# Patient Record
Sex: Female | Born: 1975
Health system: Southern US, Community
[De-identification: ages and names within clinical notes are randomized; demographics above are authoritative.]

## PROBLEM LIST (undated history)

## (undated) DIAGNOSIS — Z1371 Encounter for nonprocreative screening for genetic disease carrier status: Secondary | ICD-10-CM

## (undated) DIAGNOSIS — C50919 Malignant neoplasm of unspecified site of unspecified female breast: Secondary | ICD-10-CM

## (undated) DIAGNOSIS — K929 Disease of digestive system, unspecified: Secondary | ICD-10-CM

## (undated) DIAGNOSIS — N879 Dysplasia of cervix uteri, unspecified: Secondary | ICD-10-CM

## (undated) DIAGNOSIS — K219 Gastro-esophageal reflux disease without esophagitis: Secondary | ICD-10-CM

## (undated) DIAGNOSIS — N83209 Unspecified ovarian cyst, unspecified side: Secondary | ICD-10-CM

## (undated) DIAGNOSIS — R002 Palpitations: Secondary | ICD-10-CM

## (undated) DIAGNOSIS — K589 Irritable bowel syndrome without diarrhea: Secondary | ICD-10-CM

## (undated) DIAGNOSIS — T7840XA Allergy, unspecified, initial encounter: Secondary | ICD-10-CM

## (undated) DIAGNOSIS — F419 Anxiety disorder, unspecified: Secondary | ICD-10-CM

## (undated) HISTORY — DX: Palpitations: R00.2

## (undated) HISTORY — DX: Allergy, unspecified, initial encounter: T78.40XA

## (undated) HISTORY — PX: MOUTH SURGERY: SHX715

## (undated) HISTORY — DX: Disease of digestive system, unspecified: K92.9

## (undated) HISTORY — PX: COLONOSCOPY: SHX174

## (undated) HISTORY — PX: CRYOTHERAPY: SHX1416

## (undated) HISTORY — DX: Encounter for nonprocreative screening for genetic disease carrier status: Z13.71

## (undated) HISTORY — DX: Anxiety disorder, unspecified: F41.9

## (undated) HISTORY — DX: Malignant neoplasm of unspecified site of unspecified female breast: C50.919

## (undated) HISTORY — PX: WISDOM TOOTH EXTRACTION: SHX21

## (undated) HISTORY — DX: Dysplasia of cervix uteri, unspecified: N87.9

## (undated) HISTORY — PX: TONSILLECTOMY: SUR1361

## (undated) HISTORY — DX: Unspecified ovarian cyst, unspecified side: N83.209

## (undated) HISTORY — PX: OTHER SURGICAL HISTORY: SHX169

---

## 2012-08-26 ENCOUNTER — Encounter: Payer: Self-pay | Admitting: Internal Medicine

## 2012-09-13 ENCOUNTER — Ambulatory Visit: Payer: Self-pay

## 2012-09-13 ENCOUNTER — Ambulatory Visit: Payer: Self-pay | Admitting: Internal Medicine

## 2012-09-30 ENCOUNTER — Encounter: Payer: Self-pay | Admitting: Internal Medicine

## 2012-10-01 ENCOUNTER — Other Ambulatory Visit (INDEPENDENT_AMBULATORY_CARE_PROVIDER_SITE_OTHER): Payer: PRIVATE HEALTH INSURANCE

## 2012-10-01 ENCOUNTER — Ambulatory Visit (INDEPENDENT_AMBULATORY_CARE_PROVIDER_SITE_OTHER): Payer: PRIVATE HEALTH INSURANCE | Admitting: Internal Medicine

## 2012-10-01 ENCOUNTER — Encounter: Payer: Self-pay | Admitting: Internal Medicine

## 2012-10-01 VITALS — BP 100/70 | HR 76 | Ht 61.5 in | Wt 125.4 lb

## 2012-10-01 DIAGNOSIS — R109 Unspecified abdominal pain: Secondary | ICD-10-CM

## 2012-10-01 DIAGNOSIS — F419 Anxiety disorder, unspecified: Secondary | ICD-10-CM | POA: Insufficient documentation

## 2012-10-01 DIAGNOSIS — R1030 Lower abdominal pain, unspecified: Secondary | ICD-10-CM

## 2012-10-01 DIAGNOSIS — K529 Noninfective gastroenteritis and colitis, unspecified: Secondary | ICD-10-CM

## 2012-10-01 DIAGNOSIS — R197 Diarrhea, unspecified: Secondary | ICD-10-CM

## 2012-10-01 LAB — CBC
Hemoglobin: 13.8 g/dL (ref 12.0–15.0)
MCHC: 34.1 g/dL (ref 30.0–36.0)
MCV: 91 fl (ref 78.0–100.0)
Platelets: 249 10*3/uL (ref 150.0–400.0)
RBC: 4.45 Mil/uL (ref 3.87–5.11)

## 2012-10-01 LAB — SEDIMENTATION RATE: Sed Rate: 7 mm/hr (ref 0–22)

## 2012-10-01 MED ORDER — ALIGN PO CAPS: 1.0000 | ORAL_CAPSULE | Freq: Every day | ORAL | Status: DC

## 2012-10-01 MED ORDER — LOPERAMIDE HCL 2 MG PO CAPS: 2.0000 mg | ORAL_CAPSULE | Freq: Four times a day (QID) | ORAL | Status: DC | PRN

## 2012-10-01 MED ORDER — HYOSCYAMINE SULFATE 0.125 MG SL SUBL: 0.1250 mg | SUBLINGUAL_TABLET | SUBLINGUAL | Status: DC | PRN

## 2012-10-01 MED ORDER — PEG-KCL-NACL-NASULF-NA ASC-C 100 G PO SOLR: 1.0000 | Freq: Once | ORAL | Status: DC

## 2012-10-01 NOTE — Patient Instructions (Addendum)
You have been scheduled for a colonoscopy with propofol. Please follow written instructions given to you at your visit today.  Please pick up your prep kit at the pharmacy within the next 1-3 days. If you use inhalers (even only as needed) or a CPAP machine, please bring them with you on the day of your procedure.   We have sent the following medications to your pharmacy for you to pick up at your convenience: Loperamide, take every morning and as needed after a loose stool.  Levsin, take as directed, moviprep, you were given instructions today at your office visit.  Your physician has requested that you go to the basement for the following lab work before leaving today: CBC, TSH, ESR

## 2012-10-01 NOTE — Progress Notes (Addendum)
Patient ID: Allison Mcclure, female   DOB: December 24, 1975, 37 y.o.   MRN: [id number]  SUBJECTIVE: HPI Allison Mcclure is a 37 yo female with PMH of anxiety and ovarian cysts who is seen for evaluation of lower abd cramping pain and diarrhea.  She reports that she has a long-standing history of diarrhea, dating back to her teenage years. Her diarrhea is always triggered by eating and she describes this as "getting sick". She has not been able to find a trigger food, and states she keep the same foods on two consecutive days, and then have diarrhea one of the 2 days. She does report she has diarrhea on most days as many as 5/7. Occasionally she does have formed stool. The diarrhea is often urgent and associated with lower, cramping. The lower abdominal cramping usually abates with bowel movement. She notes that stress worsens her symptoms, but is not the sole predictor. She didn't trial of a probiotic as well as Dexilant without any benefit. She did notice some heartburn after coming off Dexilant but this abated. She reports rare nausea but no vomiting. No heartburn. No dysphagia or odynophagia. No fevers or chills. Over last several weeks she has had more right-sided, lower abdominal pain. This also usually abates with bowel movement. She was seen in emergency department near [facility] in late November for this right lower quadrant pain, was evaluated by CT scan and pelvic ultrasound. She reports is revealed several small ovarian cysts, which her GYN doctor later did not feel to be pathologic. She denies rashes, mouth ulcers, joint pains. No eye complaints.  She did see Allison Mcclure about 18 months ago and reportedly had a negative celiac panel. She's never had colonoscopy.  Review of Systems  As per history of present illness, otherwise negative   Past Medical History  Diagnosis Date  . Anxiety   . Cervical dysplasia   . Gastrointestinal disorder   . Ovarian cyst     Current Outpatient Prescriptions    Medication Sig Dispense Refill  . ALPRAZolam (XANAX) 0.5 MG tablet Take 1 mg by mouth 2 (two) times daily as needed.      . etonogestrel-ethinyl estradiol (NUVARING) 0.12-0.015 MG/24HR vaginal ring Place 1 each vaginally every 28 (twenty-eight) days. Insert vaginally and leave in place for 3 consecutive weeks, then remove for 1 week.      . loratadine (ALAVERT) 10 MG tablet Take 10 mg by mouth daily as needed.      . bifidobacterium infantis (ALIGN) capsule Take 1 capsule by mouth daily.  14 capsule  0  . hyoscyamine (LEVSIN SL) 0.125 MG SL tablet Place 1 tablet (0.125 mg total) under the tongue every 4 (four) hours as needed for cramping.  30 tablet  0  . loperamide (IMODIUM) 2 MG capsule Take 1 capsule (2 mg total) by mouth 4 (four) times daily as needed for diarrhea or loose stools.  30 capsule  0  . peg 3350 powder (MOVIPREP) 100 G SOLR Take 1 kit (100 g total) by mouth once.  1 kit  0    No Known Allergies  Family History  Problem Relation Age of Onset  . Hyperlipidemia Father   . Hypertension Mother   . Prostate cancer Father   . Colon cancer Neg Hx   . Hyperlipidemia Mother   . Hypertension Father     History  Substance Use Topics  . Smoking status: Never Smoker   . Smokeless tobacco: Never Used  . Alcohol Use: Yes  Comment: 0-1 drinks daily    OBJECTIVE: BP 100/70  Pulse 76  Ht 5' 1.5" (1.562 m)  Wt 125 lb 6.4 oz (56.881 kg)  BMI 23.31 kg/m2  LMP 09/17/2012 Constitutional: Well-developed and well-nourished. No distress. HEENT: Normocephalic and atraumatic. Oropharynx is clear and moist. No oropharyngeal exudate. Conjunctivae are normal. No scleral icterus. Neck: Neck supple. Trachea midline. Cardiovascular: Normal rate, regular rhythm and intact distal pulses. No M/R/G Pulmonary/chest: Effort normal and breath sounds normal. No wheezing, rales or rhonchi. Abdominal: Soft, mild lower tenderness without rebound or guarding, nondistended. Bowel sounds active  throughout. There are no masses palpable. No hepatosplenomegaly. Extremities: no clubbing, cyanosis, or edema Lymphadenopathy: No cervical adenopathy noted. Neurological: Alert and oriented to person place and time. Skin: Skin is warm and dry. No rashes noted. Psychiatric: Normal mood and affect. Behavior is normal.  Labs  Celiac panel dated 02/28/2011 - negative  ASSESSMENT AND PLAN:  37 yo female with PMH of anxiety and ovarian cysts who is seen for evaluation of lower abd cramping pain and diarrhea  1.  Chronic diarrhea with fecal urgency and lower abd pain -- the patient's symptoms are most consistent with irritable bowel syndrome with diarrhea predominance. We discussed the workup and I would like to order a TSH today, along with a CBC and ESR. Also have recommended colonoscopy for direct visualization and to rule out an inflammatory condition such as IBD or microscopic colitis. If this is negative then we can try symptomatic management. I have recommended loperamide 2 mg daily, she can take additional doses after each loose stool. Also will give her a trial of align one capsule daily. She's also provided Levsin to be used as needed and as directed for abdominal pain and spasm. If this is indeed IBS with diarrhea predominance and she has not responded these initial medications, I will consider a trial of Lotronex.   Addendum Records received from [facility] CT scan abdomen and pelvis with contrast performed 08/17/2012 -- conclusion: No acute intra-abdominal or pelvic pathology. Transabdominal and transvaginal pelvic grayscale ultrasound supplemented with Doppler color flow imaging -- conclusion: Essentially negative study

## 2012-10-10 ENCOUNTER — Encounter: Payer: Self-pay | Admitting: Internal Medicine

## 2012-10-10 ENCOUNTER — Ambulatory Visit (AMBULATORY_SURGERY_CENTER): Payer: PRIVATE HEALTH INSURANCE | Admitting: Internal Medicine

## 2012-10-10 VITALS — BP 107/66 | HR 75 | Temp 98.4°F | Resp 45 | Ht 61.0 in | Wt 125.0 lb

## 2012-10-10 DIAGNOSIS — R197 Diarrhea, unspecified: Secondary | ICD-10-CM

## 2012-10-10 DIAGNOSIS — K529 Noninfective gastroenteritis and colitis, unspecified: Secondary | ICD-10-CM

## 2012-10-10 DIAGNOSIS — D126 Benign neoplasm of colon, unspecified: Secondary | ICD-10-CM

## 2012-10-10 DIAGNOSIS — R109 Unspecified abdominal pain: Secondary | ICD-10-CM

## 2012-10-10 MED ORDER — SODIUM CHLORIDE 0.9 % IV SOLN: 500.0000 mL | INTRAVENOUS | Status: DC

## 2012-10-10 NOTE — Op Note (Signed)
[facility] [address] [place], [address]   COLONOSCOPY PROCEDURE REPORT  PATIENT: Allison, Mcclure  MR#: [id number] BIRTHDATE: 05/28/1976 , 36  yrs. old GENDER: Female ENDOSCOPIST: Beverley Fiedler, MD PROCEDURE DATE:  10/10/2012 PROCEDURE:   Colonoscopy with biopsy ASA CLASS:   Class I INDICATIONS:Chronic diarrhea. MEDICATIONS: MAC sedation, administered by CRNA and propofol (Diprivan) 200mg  IV  DESCRIPTION OF PROCEDURE:   After the risks benefits and alternatives of the procedure were thoroughly explained, informed consent was obtained.  A digital rectal exam revealed no rectal mass.   The LB [id number]  endoscope was introduced through the anus and advanced to the cecum, which was identified by both the appendix and ileocecal valve. No adverse events experienced. The quality of the prep was excellent, using MoviPrep  The instrument was then slowly withdrawn as the colon was fully examined.    COLON FINDINGS: A normal appearing cecum, ileocecal valve, and appendiceal orifice were identified.  The ascending, hepatic flexure, transverse, splenic flexure, descending, sigmoid colon and rectum appeared unremarkable.  No polyps or cancers were seen. Multiple random biopsies of the area were performed.  Retroflexed views revealed no abnormalities. The time to cecum=2 minutes 35 seconds.  Withdrawal time=8 minutes 02 seconds.  The scope was withdrawn and the procedure completed.  COMPLICATIONS: There were no complications.  ENDOSCOPIC IMPRESSION: Normal colon; multiple random biopsies of the area were performed  RECOMMENDATIONS: 1.  Await pathology results 2.  Continue Align one capsule daily.  Can use loperamide (Imodium) 2 mg as needed for loose stools/diarrhea (up to 8 tablets daily). 3.  Please contact the office if your symptoms remain troublesome as there are additional medications that will likely provide benefit.   eSigned:  Beverley Fiedler,  MD 10/10/2012 2:45 PM   cc: The Patient and Juluis Rainier, MD

## 2012-10-10 NOTE — Progress Notes (Signed)
Called to room to assist during endoscopic procedure.  Patient ID and intended procedure confirmed with present staff. Received instructions for my participation in the procedure from the performing physician.  

## 2012-10-10 NOTE — Progress Notes (Signed)
Lidocaine-40mg IV prior to Propofol InductionPropofol given over incremental dosages 

## 2012-10-10 NOTE — Patient Instructions (Addendum)

## 2012-10-10 NOTE — Progress Notes (Signed)
Patient did not experience any of the following events: a burn prior to discharge; a fall within the facility; wrong site/side/patient/procedure/implant event; or a hospital transfer or hospital admission upon discharge from the facility. (G8907) Patient did not have preoperative order for IV antibiotic SSI prophylaxis. (G8918)  

## 2012-10-11 ENCOUNTER — Telehealth: Payer: Self-pay | Admitting: *Deleted

## 2012-10-11 NOTE — Telephone Encounter (Signed)
Left message on number given in admitting.ewm 

## 2012-10-16 ENCOUNTER — Encounter: Payer: Self-pay | Admitting: Internal Medicine

## 2012-11-28 ENCOUNTER — Telehealth: Payer: Self-pay | Admitting: Internal Medicine

## 2012-11-28 NOTE — Telephone Encounter (Signed)
L/M for patient to call me to discuss her procedure bill.

## 2013-04-23 ENCOUNTER — Encounter: Payer: Self-pay | Admitting: Internal Medicine

## 2013-04-23 ENCOUNTER — Ambulatory Visit (INDEPENDENT_AMBULATORY_CARE_PROVIDER_SITE_OTHER): Payer: BC Managed Care – PPO | Admitting: Internal Medicine

## 2013-04-23 VITALS — BP 124/62 | HR 99 | Ht 61.0 in | Wt 121.0 lb

## 2013-04-23 DIAGNOSIS — K589 Irritable bowel syndrome without diarrhea: Secondary | ICD-10-CM | POA: Insufficient documentation

## 2013-04-23 DIAGNOSIS — R197 Diarrhea, unspecified: Secondary | ICD-10-CM

## 2013-04-23 MED ORDER — ALOSETRON HCL 0.5 MG PO TABS: 0.5000 mg | ORAL_TABLET | Freq: Two times a day (BID) | ORAL | Status: DC

## 2013-04-23 NOTE — Patient Instructions (Addendum)
We have sent the following medications to your pharmacy for you to pick up at your convenience: Lotronex  Follow up with Dr. Rhea Belton in office in 1 month                                               We are excited to introduce MyChart, a new best-in-class service that provides you online access to important information in your electronic medical record. We want to make it easier for you to view your health information - all in one secure location - when and where you need it. We expect MyChart will enhance the quality of care and service we provide.  When you register for MyChart, you can:    View your test results.    Request appointments and receive appointment reminders via email.    Request medication renewals.    View your medical history, allergies, medications and immunizations.    Communicate with your physician's office through a password-protected site.    Conveniently print information such as your medication lists.  To find out if MyChart is right for you, please talk to a member of our clinical staff today. We will gladly answer your questions about this free health and wellness tool.  If you are age 43 or older and want a member of your family to have access to your record, you must provide written consent by completing a proxy form available at our office. Please speak to our clinical staff about guidelines regarding accounts for patients younger than age 35.  As you activate your MyChart account and need any technical assistance, please call the MyChart technical support line at [phone]-[phone]) or email your question to [email or web address]. If you email your question(s), please include your name, a return phone number and the best time to reach you.  If you have non-urgent health-related questions, you can send a message to our office through MyChart at [email or web address]. If you have a medical emergency, call 911.  Thank you for using MyChart as  your new health and wellness resource!   MyChart licensed from [employer],  4540-9811. Patents Pending.

## 2013-04-23 NOTE — Progress Notes (Signed)
Subjective:    Patient ID: Allison Mcclure, female    DOB: Jul 31, 1976, 37 y.o.   MRN: [id number]  HPI [name] is a 37 yo female with PMH of IBS, anxiety and ovarian cysts who is seen in followup. She was initially seen in January 2014 and came for colonoscopy on 10/10/2012 to evaluate chronic diarrhea and lower abdominal pain. Her colonoscopy was normal and random biopsies were also benign without evidence of microscopic colitis or other inflammation. She did try probiotic, initially feeling like it helped, but it seemed to lose its efficacy and in fact later she felt more bloated. She has continued to have issues with loose stools and occasional lower abdominal cramping. Her loose stools are worse in the morning and during periods of stress. She is certain it is anxiety and stress related, though she can still have loose stools on days when she is not particularly stressed. She very rarely has a formed stool. No blood in her stool or melena. She reports on those mornings she wakes up feeling "sick" and will have 3-4 loose stools, and then occasionally will have days where she has postprandial loose stools later in the day. She has tried Imodium but finds this to be difficult to take due to an overall full feeling and giving her worse abdominal bloating.  She also tried Pepto-Bismol which helped a little bit but still leads to a full or bloated feeling.  Of note she did have an episode of substernal chest pain radiating to her back that led to ER visit which she later felt was possibly upper GI gas. She has not had this on recurring basis. She denies heartburn.  Review of Systems As per history of present illness, otherwise negative  Current Medications, Allergies, Past Medical History, Past Surgical History, Family History and Social History were reviewed in [employer] record.     Objective:   Physical Exam BP 124/62  Pulse 99  Ht 5\' 1"  (1.549 m)  Wt 121 lb  (54.885 kg)  BMI 22.87 kg/m2 Constitutional: Well-developed and well-nourished. No distress. HEENT: Normocephalic and atraumatic.  No scleral icterus. Abdominal: Soft, nontender, nondistended. Bowel sounds active throughout. There are no masses palpable. No hepatosplenomegaly. Extremities: no clubbing, cyanosis, or edema Neurological: Alert and oriented to person place and time. Skin: Skin is warm and dry. No rashes noted. Psychiatric: Normal mood and affect. Behavior is normal.  Erythrocyte Sedimentation Rate     Component Value Date/Time   ESRSEDRATE 7 10/01/2012 1012   Celiac panel -neg TSH normal  CBC    Component Value Date/Time   WBC 6.3 10/01/2012 1012   RBC 4.45 10/01/2012 1012   HGB 13.8 10/01/2012 1012   HCT 40.5 10/01/2012 1012   PLT 249.0 10/01/2012 1012   MCV 91.0 10/01/2012 1012   MCHC 34.1 10/01/2012 1012   RDW 12.6 10/01/2012 1012       Assessment & Plan:  37 yo female with PMH of IBS, anxiety and ovarian cysts who is seen in followup.  1.  IBS-D -- her symptoms are very consistent with irritable bowel syndrome with diarrhea predominance. This is a long-standing issue and she has had an unremarkable workup to this point including negative laboratory evaluation and normal colonoscopy. We have discussed IBS at length today as well as options for treatment. She did not respond well to antidiarrheals such as loperamide and bismuth. Probiotic did not seem to help her.  I do think she would be an excellent candidate  for Lotronex therapy. She has read through the medication information packet, read through and signed the patient acknowledgement form.  We discussed at length the risks, though rare, of severe constipation and even ischemic colitis. I have instructed and she has agreed to notify me immediately should she develop constipation, change in abdominal pain, worsening abdominal pain, rectal bleeding. She voiced understanding.  I will start her on Lotronex 0.5 mg twice daily.  I would like to see her back in 4-6 weeks to determine how she is responding to this new medication. She will discontinue all other antidiarrheals at this time.

## 2013-04-24 ENCOUNTER — Telehealth: Payer: Self-pay | Admitting: *Deleted

## 2013-04-24 NOTE — Telephone Encounter (Signed)
Pt wanted me to inform Dr Rhea Belton her Allison Mcclure had Colon Cancer and her mom has had 8 polyps removed. Informed pt I will forward the info to Dr Rhea Belton.

## 2013-06-09 ENCOUNTER — Telehealth: Payer: Self-pay | Admitting: *Deleted

## 2013-06-09 NOTE — Telephone Encounter (Signed)
Pt called to ask if Dr Rhea Belton found out anything about her bills. Explained to pt Dr Rhea Belton will be back tomorrow, but I will make a call and let her know; pt stated understanding.

## 2013-06-11 NOTE — Telephone Encounter (Signed)
Spoke with Clarnce Flock who received the info on pt; she is looking into the matter. lmom for pt to call back.

## 2013-06-13 ENCOUNTER — Other Ambulatory Visit: Payer: Self-pay | Admitting: Dermatology

## 2013-06-16 ENCOUNTER — Other Ambulatory Visit: Payer: Self-pay | Admitting: Dermatology

## 2013-06-16 DIAGNOSIS — D485 Neoplasm of uncertain behavior of skin: Secondary | ICD-10-CM

## 2013-06-16 NOTE — Telephone Encounter (Signed)
Pt called again today and I explained I will ask my boss about the matter; pt stated understanding.

## 2013-06-19 ENCOUNTER — Ambulatory Visit
Admission: RE | Admit: 2013-06-19 | Discharge: 2013-06-19 | Disposition: A | Discharge status: Home / Self Care | Payer: BC Managed Care – PPO | Source: Ambulatory Visit | Attending: Dermatology | Admitting: Dermatology

## 2013-06-19 DIAGNOSIS — D485 Neoplasm of uncertain behavior of skin: Secondary | ICD-10-CM

## 2013-06-26 ENCOUNTER — Telehealth: Payer: Self-pay | Admitting: *Deleted

## 2013-06-26 NOTE — Telephone Encounter (Signed)
Pt called again about her bill; will refer to Clarnce Flock.

## 2013-07-09 NOTE — Telephone Encounter (Signed)
Informed pt that our billing dept is working on an appeal to her insurance company to reconsider her claim; this process may take up to 30 days. Her account is on hold requiring no collections and no statements will be mailed. Pt was questioning the Anesthesia bill, but she will call to check on this.

## 2013-09-22 ENCOUNTER — Telehealth: Payer: Self-pay | Admitting: *Deleted

## 2013-09-22 NOTE — Telephone Encounter (Signed)
Pt was calling about her bill again. She called billing and her account is on hold. Will check with Beola Cord and call her back.

## 2013-09-22 NOTE — Telephone Encounter (Signed)
Allison Mcclure will check with Billing again; they were resending the claim.

## 2013-10-15 NOTE — Telephone Encounter (Signed)
Pt called again today. Promised her I will call her in the am. She wanted Dr Hilarie Fredrickson to know she just found out her mom had polyps removed last year. She states she has paid for anesthesia. She knows her account is on hold, but she doesn't want a surprise bill with a large balance. Informed pt I will call her tomorrow and she stated understanding.

## 2013-10-16 NOTE — Telephone Encounter (Signed)
lmom for pt that I am working on her situation.

## 2013-10-28 NOTE — Telephone Encounter (Signed)
Okay  Also  I started her on alosetron (Lotronex). Did it help?     This is an old note , but I called the pt and she never started the Lotronex; states she's doing good for the 1st time in a long time ; no diarrhea.Marland Kitchen  Spoke with her again about her bill. I will call her Friday with a point of contact for her.

## 2013-10-30 NOTE — Telephone Encounter (Signed)
lmom for pt to call back

## 2013-10-31 NOTE — Telephone Encounter (Signed)
Sheri, I have referred this pt to Luciano Cutter in billing. Pt knows that she is to contact you if she has a question. Remo Lipps is aware of this pt and the billing question as well as Dr Hilarie Fredrickson. Thanks.

## 2015-09-19 DIAGNOSIS — Z1371 Encounter for nonprocreative screening for genetic disease carrier status: Secondary | ICD-10-CM

## 2015-09-19 HISTORY — DX: Encounter for nonprocreative screening for genetic disease carrier status: Z13.71

## 2015-10-07 ENCOUNTER — Telehealth: Payer: Self-pay | Admitting: Internal Medicine

## 2015-10-07 NOTE — Telephone Encounter (Signed)
Pt states she is having abdominal pain and cramping. States that every time she eats she has diarrhea. Pt requests to be seen. Pt scheduled to see Amy Esterwood PA 10/11/15@10  am. Pt aware of appt.

## 2015-10-11 ENCOUNTER — Encounter: Payer: Self-pay | Admitting: Physician Assistant

## 2015-10-11 ENCOUNTER — Ambulatory Visit (INDEPENDENT_AMBULATORY_CARE_PROVIDER_SITE_OTHER): Payer: Managed Care, Other (non HMO) | Admitting: Physician Assistant

## 2015-10-11 ENCOUNTER — Other Ambulatory Visit (INDEPENDENT_AMBULATORY_CARE_PROVIDER_SITE_OTHER): Payer: Managed Care, Other (non HMO)

## 2015-10-11 VITALS — BP 118/72 | HR 72 | Ht 62.0 in | Wt 120.4 lb

## 2015-10-11 DIAGNOSIS — R197 Diarrhea, unspecified: Secondary | ICD-10-CM | POA: Diagnosis not present

## 2015-10-11 DIAGNOSIS — K589 Irritable bowel syndrome without diarrhea: Secondary | ICD-10-CM

## 2015-10-11 LAB — IGA: IgA: 167 mg/dL (ref 68–378)

## 2015-10-11 LAB — HIGH SENSITIVITY CRP: CRP, High Sensitivity: 4.17 mg/L (ref 0.000–5.000)

## 2015-10-11 LAB — CBC WITH DIFFERENTIAL/PLATELET
BASOS ABS: 0 10*3/uL (ref 0.0–0.1)
Basophils Relative: 0.4 % (ref 0.0–3.0)
EOS ABS: 0.1 10*3/uL (ref 0.0–0.7)
Eosinophils Relative: 1.1 % (ref 0.0–5.0)
HCT: 45.7 % (ref 36.0–46.0)
Hemoglobin: 15.3 g/dL — ABNORMAL HIGH (ref 12.0–15.0)
LYMPHS ABS: 1.7 10*3/uL (ref 0.7–4.0)
Lymphocytes Relative: 23.6 % (ref 12.0–46.0)
MCHC: 33.5 g/dL (ref 30.0–36.0)
MCV: 93.4 fl (ref 78.0–100.0)
MONO ABS: 0.5 10*3/uL (ref 0.1–1.0)
MONOS PCT: 6.7 % (ref 3.0–12.0)
NEUTROS ABS: 4.8 10*3/uL (ref 1.4–7.7)
NEUTROS PCT: 68.2 % (ref 43.0–77.0)
PLATELETS: 290 10*3/uL (ref 150.0–400.0)
RBC: 4.89 Mil/uL (ref 3.87–5.11)
RDW: 13.1 % (ref 11.5–15.5)
WBC: 7.1 10*3/uL (ref 4.0–10.5)

## 2015-10-11 LAB — TSH: TSH: 0.6 u[IU]/mL (ref 0.35–4.50)

## 2015-10-11 LAB — SEDIMENTATION RATE: SED RATE: 7 mm/h (ref 0–22)

## 2015-10-11 MED ORDER — GLYCOPYRROLATE 2 MG PO TABS: 2.0000 mg | ORAL_TABLET | Freq: Three times a day (TID) | ORAL | Status: DC

## 2015-10-11 NOTE — Patient Instructions (Addendum)
Please go to the basement level to have your labs drawn.  Start daily a probiotic, Align or Culturelle.  We sent a prescription for the Robinul Forte to [facility] in [employer], [address]   Call us in 3-4 weeks if not feeling better.

## 2015-10-11 NOTE — Progress Notes (Addendum)
Patient ID: Allison Mcclure, female   DOB: Jul 24, 1976, 40 y.o.   MRN: [id number]   Subjective:    Patient ID: Allison Mcclure, female    DOB: 03-05-1976, 40 y.o.   MRN: [id number]  HPI  Allison Mcclure  Is a pleasant 40 year old white female known to Dr.Pyrtle. She had undergone colonoscopy in January 2014 4 complaints of chronic diarrhea and abdominal discomfort. Random biopsies were done and were negative for microscopic colitis. She is felt to have diarrhea predominant IBS. She had been prescribed a trial of Lotronex in 2014 but says she never took this. She was seen by her PCP in 2015 with complaints of chest pain and indigestion was started on Prilosec daily and says this did help the indigestion and also seem to help her other "IBS" symptoms she hadn't had any problems with diarrhea for about a year and a half and then over the past 2 months started having diarrhea after eating again wishes is a little bit different than previous. She usually has 2-3 loose bowel movements per day and notices that certain food and drinks such as wine seem to aggravate the symptoms. She's not consuming any regular carbonate's or artificial sweeteners. She has had some mid abdominal discomfort as well. No recent antibiotics or changes in meds.  Review of Systems Pertinent positive and negative review of systems were noted in the above HPI section.  All other review of systems was otherwise negative.  Outpatient Encounter Prescriptions as of 10/11/2015  Medication Sig  . chlordiazePOXIDE (LIBRIUM) 10 MG capsule Take 10 mg by mouth 3 (three) times daily as needed for anxiety.  Marland Kitchen etonogestrel-ethinyl estradiol (NUVARING) 0.12-0.015 MG/24HR vaginal ring Place 1 each vaginally every 28 (twenty-eight) days. Insert vaginally and leave in place for 3 consecutive weeks, then remove for 1 week.  . loratadine (ALAVERT) 10 MG tablet Take 10 mg by mouth daily as needed.  Marland Kitchen omeprazole (PRILOSEC) 20 MG capsule TAKE 1 CAPUSLE BY  MOUTH DAILY 30 MINS PRIOR TO FIRST MEAL AS NEEDED  . [DISCONTINUED] alosetron (LOTRONEX) 0.5 MG tablet Take 1 tablet (0.5 mg total) by mouth 2 (two) times daily.  . [DISCONTINUED] ALPRAZolam (XANAX) 0.5 MG tablet Take 1 mg by mouth 2 (two) times daily as needed.  Marland Kitchen glycopyrrolate (ROBINUL-FORTE) 2 MG tablet Take 1 tablet (2 mg total) by mouth 3 (three) times daily.   No facility-administered encounter medications on file as of 10/11/2015.   No Known Allergies Patient Active Problem List   Diagnosis Date Noted  . IBS (irritable bowel syndrome) 04/23/2013  . Chronic diarrhea 10/01/2012  . Anxiety 10/01/2012   Social History   Social History  . Marital Status: Unknown    Spouse Name: N/A  . Number of Children: N/A  . Years of Education: N/A   Occupational History  . Not on file.   Social History Main Topics  . Smoking status: Never Smoker   . Smokeless tobacco: Never Used  . Alcohol Use: Yes     Comment: 0-1 drinks daily  . Drug Use: No  . Sexual Activity: Not on file   Other Topics Concern  . Not on file   Social History Narrative    [name] family history includes Hyperlipidemia in her father and mother; Hypertension in her father and mother; Prostate cancer in her father. There is no history of Colon cancer.      Objective:    Filed Vitals:   10/11/15 1017  BP: 118/72  Pulse: 72  Physical Exam   Well-developed white female in no acute distress , pleasant blood pressure 118/72 pulse 72 height 5 foot 2 weight 120. HEENT; nontraumatic normocephalic EOMI PERRLA sclera anicteric, Cardiovascular; regular rate and rhythm with S1-S2 no murmur rub or gallop, Pulmonary; clear bilaterally, Abdomen; soft no focal tenderness no guarding or rebound no palpable mass or hepatosplenomegaly bowel sounds are present, Rectal; exam not done, Ext; no clubbing cyanosis or edema skin warm and dry, Neuropsych ;mood and affect appropriate     Assessment & Plan:   #1 40 yo  female with hx IBS-D with exacerbation of symptoms x 2 months with loose stools and intermittent abdominal pain #2 GERD   Plan; Pt prefers not to take a daily prescription med if possible Will try adding daily probiotic -Align or Culturelle Add Robinul forte 2 mg po qam- BID as needed She will try this for a month and is  No improvement will call- we discussed a trial of Viberzi 100 mg po bid if above ineffective Continue omeprazole 20 mg po daily CBC,CRP,TSH, TTG Allison Mcclure    Allison Mcclure S Allison Summons PA-C 10/11/2015   Cc: Allison Ruff, MD  Addendum: Reviewed and agree with management. Allison Bears, MD

## 2015-10-12 LAB — TISSUE TRANSGLUTAMINASE, IGG: Tissue Transglut Ab: 1 U/mL (ref <6)

## 2015-12-08 DIAGNOSIS — J3089 Other allergic rhinitis: Secondary | ICD-10-CM | POA: Insufficient documentation

## 2015-12-18 DIAGNOSIS — C50919 Malignant neoplasm of unspecified site of unspecified female breast: Secondary | ICD-10-CM

## 2015-12-18 HISTORY — DX: Malignant neoplasm of unspecified site of unspecified female breast: C50.919

## 2016-01-03 ENCOUNTER — Other Ambulatory Visit: Payer: Self-pay | Admitting: Radiology

## 2016-01-06 ENCOUNTER — Telehealth: Payer: Self-pay | Admitting: *Deleted

## 2016-01-06 DIAGNOSIS — C50411 Malignant neoplasm of upper-outer quadrant of right female breast: Secondary | ICD-10-CM

## 2016-01-06 NOTE — Telephone Encounter (Signed)
Confirmed [facility] for 01/12/16 at 1215 .  Instructions and contact information given. 

## 2016-01-06 NOTE — Telephone Encounter (Signed)
Left vm for pt to return call regarding appt for [facility] on 01/12/16.

## 2016-01-10 ENCOUNTER — Telehealth: Payer: Self-pay | Admitting: *Deleted

## 2016-01-11 ENCOUNTER — Telehealth: Payer: Self-pay | Admitting: *Deleted

## 2016-01-11 NOTE — Telephone Encounter (Signed)
Pt decided to attend [facility] on 01/19/16. Instructions given.

## 2016-01-11 NOTE — Telephone Encounter (Signed)
Pt called and request her surgeon of either Dr. Donne Hazel or Dr. Barry Dienes. Informed pt that Dr. Barry Dienes will be in clinic next week on 5/3 if she would like to wait for that clinic, or she could be seen by either of them without coming to clinic. Pt relate she will discuss her parents to see which appt she would like. Pt will call with final decision.

## 2016-01-12 ENCOUNTER — Ambulatory Visit: Payer: Managed Care, Other (non HMO) | Admitting: Oncology

## 2016-01-12 ENCOUNTER — Other Ambulatory Visit: Payer: Managed Care, Other (non HMO)

## 2016-01-12 ENCOUNTER — Encounter: Payer: Self-pay | Admitting: *Deleted

## 2016-01-12 ENCOUNTER — Ambulatory Visit: Payer: Managed Care, Other (non HMO) | Admitting: Physical Therapy

## 2016-01-12 DIAGNOSIS — C50411 Malignant neoplasm of upper-outer quadrant of right female breast: Secondary | ICD-10-CM

## 2016-01-18 ENCOUNTER — Other Ambulatory Visit: Payer: Self-pay | Admitting: General Surgery

## 2016-01-18 ENCOUNTER — Telehealth: Payer: Self-pay | Admitting: *Deleted

## 2016-01-18 NOTE — Telephone Encounter (Signed)
  Oncology Nurse Navigator Documentation    Navigator Encounter Type: Telephone (01/18/16 1500) Telephone: Lahoma Crocker Call;Appt Confirmation/Clarification (01/18/16 1500)                                        Time Spent with Patient: 30 (01/18/16 1500)

## 2016-01-19 ENCOUNTER — Other Ambulatory Visit: Payer: Self-pay | Admitting: General Surgery

## 2016-01-19 ENCOUNTER — Encounter: Payer: Self-pay | Admitting: Hematology and Oncology

## 2016-01-19 ENCOUNTER — Ambulatory Visit (HOSPITAL_BASED_OUTPATIENT_CLINIC_OR_DEPARTMENT_OTHER): Payer: Managed Care, Other (non HMO) | Admitting: Hematology and Oncology

## 2016-01-19 ENCOUNTER — Ambulatory Visit
Admission: RE | Admit: 2016-01-19 | Discharge: 2016-01-19 | Disposition: A | Discharge status: Home / Self Care | Payer: Managed Care, Other (non HMO) | Source: Ambulatory Visit | Attending: Radiation Oncology | Admitting: Radiation Oncology

## 2016-01-19 ENCOUNTER — Ambulatory Visit: Payer: Managed Care, Other (non HMO) | Attending: General Surgery | Admitting: Physical Therapy

## 2016-01-19 ENCOUNTER — Other Ambulatory Visit (HOSPITAL_BASED_OUTPATIENT_CLINIC_OR_DEPARTMENT_OTHER): Payer: Managed Care, Other (non HMO)

## 2016-01-19 ENCOUNTER — Encounter: Payer: Self-pay | Admitting: Physical Therapy

## 2016-01-19 VITALS — BP 135/80 | HR 86 | Temp 98.1°F | Resp 20 | Ht 62.0 in | Wt 117.6 lb

## 2016-01-19 DIAGNOSIS — C50411 Malignant neoplasm of upper-outer quadrant of right female breast: Secondary | ICD-10-CM

## 2016-01-19 DIAGNOSIS — M545 Low back pain, unspecified: Secondary | ICD-10-CM

## 2016-01-19 LAB — CBC WITH DIFFERENTIAL/PLATELET
BASO%: 0.6 % (ref 0.0–2.0)
BASOS ABS: 0.1 10*3/uL (ref 0.0–0.1)
EOS%: 1.2 % (ref 0.0–7.0)
Eosinophils Absolute: 0.1 10*3/uL (ref 0.0–0.5)
HEMATOCRIT: 44.9 % (ref 34.8–46.6)
HGB: 14.7 g/dL (ref 11.6–15.9)
LYMPH#: 1.5 10*3/uL (ref 0.9–3.3)
LYMPH%: 17.8 % (ref 14.0–49.7)
MCH: 31 pg (ref 25.1–34.0)
MCHC: 32.8 g/dL (ref 31.5–36.0)
MCV: 94.5 fL (ref 79.5–101.0)
MONO#: 0.5 10*3/uL (ref 0.1–0.9)
MONO%: 5.9 % (ref 0.0–14.0)
NEUT#: 6.2 10*3/uL (ref 1.5–6.5)
NEUT%: 74.5 % (ref 38.4–76.8)
PLATELETS: 271 10*3/uL (ref 145–400)
RBC: 4.75 10*6/uL (ref 3.70–5.45)
RDW: 12.7 % (ref 11.2–14.5)
WBC: 8.4 10*3/uL (ref 3.9–10.3)

## 2016-01-19 LAB — COMPREHENSIVE METABOLIC PANEL
ALT: 12 U/L (ref 0–55)
ANION GAP: 8 meq/L (ref 3–11)
AST: 13 U/L (ref 5–34)
Albumin: 3.7 g/dL (ref 3.5–5.0)
Alkaline Phosphatase: 55 U/L (ref 40–150)
BUN: 15.1 mg/dL (ref 7.0–26.0)
CALCIUM: 9.2 mg/dL (ref 8.4–10.4)
CHLORIDE: 109 meq/L (ref 98–109)
CO2: 23 meq/L (ref 22–29)
CREATININE: 0.9 mg/dL (ref 0.6–1.1)
EGFR: 85 mL/min/{1.73_m2} — AB (ref >90)
Glucose: 99 mg/dl (ref 70–140)
POTASSIUM: 3.7 meq/L (ref 3.5–5.1)
Sodium: 140 mEq/L (ref 136–145)
Total Bilirubin: 0.36 mg/dL (ref 0.20–1.20)
Total Protein: 6.6 g/dL (ref 6.4–8.3)

## 2016-01-19 NOTE — Patient Instructions (Signed)

## 2016-01-19 NOTE — Progress Notes (Signed)
Note created during office visit by Dr. Gudena, copy to patient, original to scan. 

## 2016-01-19 NOTE — Assessment & Plan Note (Signed)
Screening detected left breast asymmetry posteriorly at 11:00 position 9 x 8 x 5 mm by ultrasound, axilla negative Right breast ultrasound measured 8 mm at 10:00 position axillary normal, T1b N0 stage IA  Pathology and radiology counseling:Discussed with the patient, the details of pathology including the type of breast cancer,the clinical staging, the significance of ER, PR and HER-2/neu receptors and the implications for treatment. After reviewing the pathology in detail, we proceeded to discuss the different treatment options between surgery, radiation, chemotherapy, antiestrogen therapies.  Recommendations:genetic testing has been performed by her PCP. 1. Breast conserving surgery followed by 2. Oncotype DX testing to determine if chemotherapy would be of any benefit followed by 3. Adjuvant radiation therapy followed by 4. Adjuvant antiestrogen therapy  Oncotype counseling: I discussed Oncotype DX test. I explained to the patient that this is a 21 gene panel to evaluate patient tumors DNA to calculate recurrence score. This would help determine whether patient has high risk or intermediate risk or low risk breast cancer. She understands that if her tumor was found to be high risk, she would benefit from systemic chemotherapy. If low risk, no need of chemotherapy. If she was found to be intermediate risk, we would need to evaluate the score as well as other risk factors and determine if an abbreviated chemotherapy may be of benefit.  Return to clinic after surgery to discuss final pathology report and then determine if Oncotype DX testing will need to be sent.

## 2016-01-19 NOTE — Progress Notes (Addendum)
[facility] CONSULT NOTE  Patient Care Team: Lisa Miller, MD as PCP - General (Family Medicine)  CHIEF COMPLAINTS/PURPOSE OF CONSULTATION:  Newly diagnosed breast cancer  HISTORY OF PRESENTING ILLNESS:  Allison Mcclure 39 y.o. female is here because of recent diagnosis of right breast cancer. Patient had a screening mammogram detected right breast asymmetry measuring 8 mm by ultrasound at 10:00 position. 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 She was presented at the multidisciplinary tumor board and she is here today at the [facility] clinic to discuss the treatment plan. She is accompanied by her sister, mother and father.  SUMMARY OF ONCOLOGIC HISTORY:   Breast cancer of upper-outer quadrant of right female breast (HCC)   01/03/2016 Initial Diagnosis Right breast biopsy: Invasive ductal carcinoma with LVI, grade 2,ER 100%, PR 100%, Ki-67 10%, HER-2 negative ratio 1.23   01/03/2016 Mammogram screening detected asymmetry in the right breast ultrasound measured 8 mm at 10:00 position axillary normal, T1b N0 stage IA    In terms of breast cancer risk profile:  She menarched at early age of 11 She had no children  She has received birth control pills.  She was never exposed to fertility medications or hormone replacement therapy.  She has no family history of Breast/GYN/GI cancer Father had prostate cancer  MEDICAL HISTORY:  Past Medical History  Diagnosis Date  . Anxiety   . Cervical dysplasia   . Gastrointestinal disorder   . Ovarian cyst   . Breast cancer (HCC)     SURGICAL HISTORY: Past Surgical History  Procedure Laterality Date  . Arm surgery      left  . Cryotherapy    . Tonsillectomy    . Wisdom tooth extraction    . Mouth surgery      "gum surgery" x 2    SOCIAL HISTORY: Social History   Social History  . Marital Status:  Unknown    Spouse Name: N/A  . Number of Children: N/A  . Years of Education: N/A   Occupational History  . Not on file.   Social History Main Topics  . Smoking status: Never Smoker   . Smokeless tobacco: Never Used  . Alcohol Use: Yes     Comment: 0-1 drinks daily  . Drug Use: No  . Sexual Activity: Not on file   Other Topics Concern  . Not on file   Social History Narrative    FAMILY HISTORY: Family History  Problem Relation Age of Onset  . Hyperlipidemia Father   . Prostate cancer Father   . Hypertension Father   . Hypertension Mother   . Hyperlipidemia Mother   . Colon cancer Paternal Grandfather     ALLERGIES:  has No Known Allergies.  MEDICATIONS:  Current Outpatient Prescriptions  Medication Sig Dispense Refill  . ALPRAZolam (XANAX) 0.5 MG tablet Take 0.5 mg by mouth at bedtime as needed for anxiety.    . chlordiazePOXIDE (LIBRIUM) 10 MG capsule Take 10 mg by mouth 3 (three) times daily as needed for anxiety.    . loratadine (ALAVERT) 10 MG tablet Take 10 mg by mouth daily as needed.    . omeprazole (PRILOSEC) 20 MG capsule TAKE 1 CAPUSLE BY MOUTH DAILY 30 MINS PRIOR TO FIRST MEAL AS NEEDED  1  . triamcinolone (NASACORT) 55 MCG/ACT AERO nasal inhaler Place into the nose.       No current facility-administered medications for this visit.    REVIEW OF SYSTEMS:   Constitutional: Denies fevers, chills or abnormal night sweats Eyes: Denies blurriness of vision, double vision or watery eyes Ears, nose, mouth, throat, and face: Denies mucositis or sore throat Respiratory: Denies cough, dyspnea or wheezes Cardiovascular: Denies palpitation, chest discomfort or lower extremity swelling Gastrointestinal:  Denies nausea, heartburn or change in bowel habits Skin: Denies abnormal skin rashes Lymphatics: Denies new lymphadenopathy or easy bruising Neurological:Denies numbness, tingling or new weaknesses Behavioral/Psych: Mood is stable, no new changes  Breast:  Denies  any palpable lumps or discharge All other systems were reviewed with the patient and are negative.  PHYSICAL EXAMINATION: ECOG PERFORMANCE STATUS: 0 - Asymptomatic  Filed Vitals:   01/19/16 1241  BP: 135/80  Pulse: 86  Temp: 98.1 F (36.7 C)  Resp: 20   Filed Weights   01/19/16 1241  Weight: 117 lb 9.6 oz (53.343 kg)    GENERAL:alert, no distress and comfortable SKIN: skin color, texture, turgor are normal, no rashes or significant lesions EYES: normal, conjunctiva are pink and non-injected, sclera clear OROPHARYNX:no exudate, no erythema and lips, buccal mucosa, and tongue normal  NECK: supple, thyroid normal size, non-tender, without nodularity LYMPH:  no palpable lymphadenopathy in the cervical, axillary or inguinal LUNGS: clear to auscultation and percussion with normal breathing effort HEART: regular rate & rhythm and no murmurs and no lower extremity edema ABDOMEN:abdomen soft, non-tender and normal bowel sounds Musculoskeletal:no cyanosis of digits and no clubbing  PSYCH: alert & oriented x 3 with fluent speech NEURO: no focal motor/sensory deficits BREAST: No palpable nodules in breast. No palpable axillary or supraclavicular lymphadenopathy (exam performed in the presence of a chaperone)   LABORATORY DATA:  I have reviewed the data as listed Lab Results  Component Value Date   WBC 8.4 01/19/2016   HGB 14.7 01/19/2016   HCT 44.9 01/19/2016   MCV 94.5 01/19/2016   PLT 271 01/19/2016   Lab Results  Component Value Date   NA 140 01/19/2016   K 3.7 01/19/2016   CO2 23 01/19/2016    RADIOGRAPHIC STUDIES: I have personally reviewed the radiological reports and agreed with the findings in the report.  ASSESSMENT AND PLAN:  Breast cancer of upper-outer quadrant of right female breast ([place]) Screening detected left breast asymmetry posteriorly at 11:00 position 9 x 8 x 5 mm by ultrasound, axilla negative Right breast ultrasound measured 8 mm at 10:00 position  axillary normal, T1b N0 stage IA  Pathology and radiology counseling:Discussed with the patient, the details of pathology including the type of breast cancer,the clinical staging, the significance of ER, PR and HER-2/neu receptors and the implications for treatment. After reviewing the pathology in detail, we proceeded to discuss the different treatment options between surgery, radiation, chemotherapy, antiestrogen therapies.  Recommendations:genetic testing has been performed by her PCP. 1. Breast conserving surgery (bracketed lumpectomy) followed by 2. Oncotype DX testing to determine if chemotherapy would be of any benefit followed by 3. Adjuvant radiation therapy followed by 4. Adjuvant antiestrogen therapy  Oncotype counseling: I discussed Oncotype DX test. I explained to the patient that this is a 21 gene panel to evaluate patient tumors DNA to calculate recurrence score. This would help determine whether patient has high risk or intermediate risk or low risk breast cancer. She understands that if her tumor was found to be high risk, she would benefit from systemic chemotherapy. If low risk, no need of chemotherapy. If she was found  to be intermediate risk, we would need to evaluate the score as well as other risk factors and determine if an abbreviated chemotherapy may be of benefit.  Return to clinic after surgery to discuss final pathology report and then determine if Oncotype DX testing will need to be sent.  All questions were answered. The patient knows to call the clinic with any problems, questions or concerns.    Rulon Eisenmenger, MD 01/19/2016

## 2016-01-19 NOTE — Progress Notes (Signed)
Radiation Oncology         [phone] ________________________________  Name: Allison Mcclure MRN: [id number]  Date: 01/19/2016  DOB: 06-04-1976  MI:[id number] [name], MD  Almond Lint, MD     REFERRING PHYSICIAN: Almond Lint, MD   DIAGNOSIS: The encounter diagnosis was Breast cancer of upper-outer quadrant of right female breast (HCC).    HISTORY OF PRESENT ILLNESS: Allison Mcclure is a 40 y.o. female seen after a screening mammogram which revealed asymmetry of the right breast. An Ultrasound on 12/30/15 revealed an 8 mm lesion and was negative for axillary adenopathy. A biopsy reveals a grade 1-2 invasive ductal carcinoma of the right breast with LVSI. Her tumor was ER/PR positive, HER-2 neu negative. She comes today to [facility] clinic to meet with her care team and to specifically meet with Dr. Mitzi Hansen to discuss the role of radiotherapy.   PREVIOUS RADIATION THERAPY: No   PAST MEDICAL HISTORY:  Past Medical History  Diagnosis Date  . Anxiety   . Cervical dysplasia   . Gastrointestinal disorder   . Ovarian cyst   . Breast cancer (HCC)        PAST SURGICAL HISTORY: Past Surgical History  Procedure Laterality Date  . Arm surgery      left  . Cryotherapy    . Tonsillectomy    . Wisdom tooth extraction    . Mouth surgery      "gum surgery" x 2     FAMILY HISTORY:  Family History  Problem Relation Age of Onset  . Hyperlipidemia Father   . Prostate cancer Father   . Hypertension Father   . Hypertension Mother   . Hyperlipidemia Mother   . Colon cancer Paternal Grandfather      SOCIAL HISTORY:  reports that she has never smoked. She has never used smokeless tobacco. She reports that she drinks alcohol. She reports that she does not use illicit drugs. She is divorced and lives in [place]. 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   ALLERGIES: Review of patient's allergies indicates no known allergies.   MEDICATIONS:  Current Outpatient  Prescriptions  Medication Sig Dispense Refill  . ALPRAZolam (XANAX) 0.5 MG tablet Take 0.5 mg by mouth at bedtime as needed for anxiety.    . chlordiazePOXIDE (LIBRIUM) 10 MG capsule Take 10 mg by mouth 3 (three) times daily as needed for anxiety.    Marland Kitchen loratadine (ALAVERT) 10 MG tablet Take 10 mg by mouth daily as needed.    Marland Kitchen omeprazole (PRILOSEC) 20 MG capsule TAKE 1 CAPUSLE BY MOUTH DAILY 30 MINS PRIOR TO FIRST MEAL AS NEEDED  1  . triamcinolone (NASACORT) 55 MCG/ACT AERO nasal inhaler Place into the nose.     No current facility-administered medications for this encounter.     REVIEW OF SYSTEMS: On review of systems, the patient reports that she is doing well overall. She has some tenderness of her right breast but no erythema or edema. She denies any chest pain, shortness of breath, cough, fevers, chills, night sweats, unintended weight changes. She denies any bowel or bladder disturbances, and denies abdominal pain, nausea or vomiting. She denies any new musculoskeletal or joint aches or pains. A complete review of systems is obtained and is otherwise negative.     PHYSICAL EXAM:   Pain scale 0/10 In general this is a well appearing caucasian female in no acute distress. She is alert and oriented x4 and appropriate throughout the examination. HEENT reveals that the  patient is normocephalic, atraumatic. EOMs are intact. PERRLA. Skin is intact without any evidence of gross lesions. Cardiovascular exam reveals a regular rate and rhythm, no clicks rubs or murmurs are auscultated. Chest is clear to auscultation bilaterally. Lymphatic assessment is performed and does not reveal any adenopathy in the cervical, supraclavicular, axillary, or inguinal chains. Bilateral breasts are examined. The right does not have any palpable abnormalities, but a post biopsy site is slightly tender. The left breast does not reveal any palpable abnormalities. No nipple discharge or bleeding is noted. Abdomen has  active bowel sounds in all quadrants and is intact. The abdomen is soft, non tender, non distended. Lower extremities are negative for pretibial pitting edema, deep calf tenderness, cyanosis or clubbing.   ECOG = 0  0 - Asymptomatic (Fully active, able to carry on all predisease activities without restriction)  1 - Symptomatic but completely ambulatory (Restricted in physically strenuous activity but ambulatory and able to carry out work of a light or sedentary nature. For example, light housework, office work)  2 - Symptomatic, <50% in bed during the day (Ambulatory and capable of all self care but unable to carry out any work activities. Up and about more than 50% of waking hours)  3 - Symptomatic, >50% in bed, but not bedbound (Capable of only limited self-care, confined to bed or chair 50% or more of waking hours)  4 - Bedbound (Completely disabled. Cannot carry on any self-care. Totally confined to bed or chair)  5 - Death   Eustace Pen MM, Creech RH, Tormey DC, et al. [phone]). "Toxicity and response criteria of the [facility]". [place] Oncol. 5 (6): 649-55    LABORATORY DATA:  Lab Results  Component Value Date   WBC 8.4 01/19/2016   HGB 14.7 01/19/2016   HCT 44.9 01/19/2016   MCV 94.5 01/19/2016   PLT 271 01/19/2016   Lab Results  Component Value Date   NA 140 01/19/2016   K 3.7 01/19/2016   CO2 23 01/19/2016   Lab Results  Component Value Date   ALT 12 01/19/2016   AST 13 01/19/2016   ALKPHOS 55 01/19/2016   BILITOT 0.36 01/19/2016      RADIOGRAPHY: No results found.     IMPRESSION: cT1,N0,M0 Grade 1-2, ER/PR positive, HER-2 negative invasive ductal carcinoma of the right breast.   PLAN: Dr. [name] discusses the pathology findings and reviews the nature of invasive disease. The consensus from conference was for genetic counselling followed by probable lumpectomy and sentinel lymph node assessment. Her tumor will be sent for oncotype as  well and provided her risk is low and that she does not need systemic therapy, we would follow surgery with radiotherapy to the right breast. Upon completing radiotherapy, she would meet back to consider antiestrogen therapy with Dr. Lindi Adie.  Regarding radiation, Dr. [name] discusses the rationale for this to reduce local recurrence, and recommends 33 fractions over 6 1/2 weeks. He discusses the risks, benefits, short, and long term effects of radiotherapy. We discussed that we would plan to meet back with her about 4 weeks after surgery provided that chemotherapy is not indicated to initiate the planning process to begin radiotherapy. She states agreement and understanding.   In a visit last 60 minutes, greater than 50% of the time was spend discussing questions about treatment and scenarios if she had recurrence and or if she required chemotherapy.  The above documentation reflects my direct findings during this shared patient visit. Please see  the separate note by Dr. [name] on this date for the remainder of the patient's plan of care.    Carola Rhine, PAC

## 2016-01-19 NOTE — Therapy (Signed)
[place], [place], [address] Phone: [phone]   Fax:  [phone]  Physical Therapy Evaluation  Patient Details  Name: Allison Mcclure MRN: [id number] Date of Birth: 27-Oct-1975 Referring Provider: Dr. Stark Klein  Encounter Date: 01/19/2016      PT End of Session - 01/19/16 1540    Visit Number 1   Number of Visits 1   PT Start Time 1448   PT Stop Time 1513   PT Time Calculation (min) 25 min   Activity Tolerance Patient tolerated treatment well   Behavior During Therapy [facility] for tasks assessed/performed      Past Medical History  Diagnosis Date  . Anxiety   . Cervical dysplasia   . Gastrointestinal disorder   . Ovarian cyst   . Breast cancer [facility])     Past Surgical History  Procedure Laterality Date  . Arm surgery      left  . Cryotherapy    . Tonsillectomy    . Wisdom tooth extraction    . Mouth surgery      "gum surgery" x 2    There were no vitals filed for this visit.       Subjective Assessment - 01/19/16 1531    Subjective Patient reports she is here today to be seen by her multi-disciplinary team for her newly diagnosed right breast cancer.   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  It measures 8 mm and is located in the upper outer quadrant.  It is ER/PR positive, HER2 negative and has a Ki67 of 10%.  She had genetic testing done last week as her gynecologist ordered it.   Patient Stated Goals Reduce lymphedema risk and learn post op shoulder ROM HEP   Currently in Pain? Yes   Pain Score 5    Pain Location Back   Pain Orientation Lower   Pain Descriptors / Indicators Aching   Pain Type Chronic pain   Pain Onset More than a month ago   Pain Frequency Intermittent   Aggravating Factors  unknown   Pain Relieving Factors unknown    Multiple Pain Sites No            [facility] PT Assessment - 01/19/16 0001    Assessment   Medical Diagnosis Right breast cancer   Referring Provider Dr. Stark Klein   Onset Date/Surgical Date 12/27/15   Hand Dominance Right   Prior Therapy none   Precautions   Precautions Other (comment)   Precaution Comments active breast cancer   Restrictions   Weight Bearing Restrictions No   Balance Screen   Has the patient fallen in the past 6 months No   Has the patient had a decrease in activity level because of a fear of falling?  No   Is the patient reluctant to leave their home because of a fear of falling?  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   Leisure She walks and runs 1-2x/week for 30 minutes   Cognition   Overall Cognitive Status Within Functional Limits for tasks assessed   Posture/Postural Control   Posture/Postural Control Postural limitations  Postural Limitations Decreased lumbar lordosis   Posture Comments Slumped sitting posture to compensate for lower back pain   ROM / Strength   AROM / PROM / Strength Strength;AROM   AROM   Overall AROM Comments Cervical AROM tested and was all WNL   AROM Assessment Site Shoulder   Right/Left Shoulder Right;Left   Right Shoulder Extension 35 Degrees   Right Shoulder Flexion 156 Degrees   Right Shoulder ABduction 169 Degrees   Right Shoulder Internal Rotation 60 Degrees   Right Shoulder External Rotation 72 Degrees   Left Shoulder Extension 30 Degrees   Left Shoulder Flexion 155 Degrees   Left Shoulder ABduction 170 Degrees   Left Shoulder Internal Rotation 67 Degrees   Left Shoulder External Rotation 75 Degrees   Strength   Overall Strength Within functional limits for tasks performed            LYMPHEDEMA/ONCOLOGY QUESTIONNAIRE - 01/19/16 1537    Type   Cancer Type Right breast cancer   Lymphedema Assessments   Lymphedema Assessments Upper extremities   Right Upper Extremity Lymphedema   10 cm Proximal to Olecranon Process 24.2 cm   Olecranon Process 21.6 cm   10 cm Proximal to Ulnar Styloid Process 20.5 cm   Just Proximal to Ulnar Styloid Process 14 cm   Across Hand at [employer] 17.9 cm   At [place] of 2nd Digit 5.6 cm   Left Upper Extremity Lymphedema   10 cm Proximal to Olecranon Process 24 cm   Olecranon Process 21.3 cm   10 cm Proximal to Ulnar Styloid Process 20 cm   Just Proximal to Ulnar Styloid Process 13.7 cm   Across Hand at [employer] 17.5 cm   At [place] of 2nd Digit 5.4 cm      Patient was instructed today in a home exercise program today for post op shoulder range of motion. These included active assist shoulder flexion in sitting, scapular retraction, wall walking with shoulder abduction, and hands behind head external rotation.  She was encouraged to do these twice a day, holding 3 seconds and repeating 5 times when permitted by her physician.         PT Education - 01/19/16 1539    Education provided Yes   Education Details Lymphedema risk reduction and post op shoulder ROM HEP   Person(s) Educated Patient;Parent(s);Other (comment)  Parents and sister   Methods Explanation;Demonstration;Handout   Comprehension Returned demonstration;Verbalized understanding              Breast Clinic Goals - 01/19/16 1554    Patient will be able to verbalize understanding of pertinent lymphedema risk reduction practices relevant to her diagnosis specifically related to skin care.   Time 1   Period Days   Status Achieved   Patient will be able to return demonstrate and/or verbalize understanding of the post-op home exercise program related to regaining shoulder range of motion.   Time 1   Period Days   Status Achieved   Patient will be  able to verbalize understanding of the importance of attending the postoperative After Breast Cancer Class for further lymphedema risk reduction education and therapeutic exercise.   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  It measures 8 mm  and is located in the upper outer quadrant.  It is ER/PR positive, HER2 negative and has a Ki67 of 10%.  She had genetic testing done last week as her gynecologist ordered it.  She is planning to have a right lumpectomy and sentinel node biopsy followed by Oncotype testing, radiation and anti-estrogen therapy.  She may benefit from post op PT to regain shoulder ROM and reduce lymphedema risk.   Rehab Potential Excellent   Clinical Impairments Affecting Rehab Potential none   PT Frequency One time visit   PT Treatment/Interventions Therapeutic exercise;Patient/family education   PT Next Visit Plan Will f/u after surgery   PT Home Exercise Plan Post op shoulder ROM HEP   Consulted and Agree with Plan of Care Patient;Family member/caregiver   Family Member Consulted parents and sister      Patient will benefit from skilled therapeutic intervention in order to improve the following deficits and impairments:  Decreased strength, Decreased knowledge of precautions, Pain, Impaired UE functional use, Decreased range of motion  Visit Diagnosis: Carcinoma of upper-outer quadrant of right female breast ([place]) - Plan: PT plan of care cert/re-cert  Bilateral low back pain without sciatica - Plan: PT plan of care cert/re-cert   Patient will follow up at outpatient cancer rehab if needed following surgery.  If the patient requires physical therapy at that time, a specific plan will be dictated and sent to the referring physician for approval. The patient was educated today on appropriate  basic range of motion exercises to begin post operatively and the importance of attending the After Breast Cancer class following surgery.  Patient was educated today on lymphedema risk reduction practices as it pertains to recommendations that will benefit the patient immediately following surgery.  She verbalized good understanding.  No additional physical therapy is indicated at this time.      Problem List Patient Active Problem List   Diagnosis Date Noted  . Breast cancer of upper-outer quadrant of right female breast ([place]) 01/06/2016  . IBS (irritable bowel syndrome) 04/23/2013  . Chronic diarrhea 10/01/2012  . Anxiety 10/01/2012   Annia Friendly, PT 01/19/2016 3:57 PM  [place], [place], [address] Phone: [phone]   Fax:  [phone]  Name: Allison Mcclure MRN: [id number] Date of Birth: 1976-07-15

## 2016-01-20 ENCOUNTER — Encounter: Payer: Self-pay | Admitting: General Practice

## 2016-01-20 NOTE — Progress Notes (Signed)
[facility] Psychosocial Distress Screening Spiritual Care Note  Met with pt ("Allison Mcclure"), parents, and older sister at [facility] to introduce [place] team/resources, reviewing distress screen per protocol.  The patient scored a 9 on the Psychosocial Distress Thermometer which indicates severe distress. Also assessed for distress and other psychosocial needs.   ONCBCN DISTRESS SCREENING 01/20/2016  Screening Type Initial Screening  Distress experienced in past week (1-10) 9  Emotional problem type Nervousness/Anxiety;Adjusting to illness  Information Concerns Type Lack of info about diagnosis;Lack of info about treatment;Lack of info about complementary therapy choices;Lack of info about maintaining fitness  Physical Problem type Sleep/insomnia  Referral to support programs Yes  Other Spiritual Care   Per Allison Mcclure, receiving more medical information and support has decreased distress to 6.  She reports difficulty sleeping due to anxiety about dx/tx, hoping that clinic experience will help decrease sleep challenges.  We talked about fielding other people's anxiety and "less helpful" (not constructive) comments, refocusing on her experience and priorities.  She reports good support from friends, family, and a Bible study group that serves as a Adult nurse.  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  We addressed other coping tools and programs available through [employer].  She and family verbalized appreciation for variety of whole-person support.  Follow up needed: Yes.  Pt and family aware of ongoing Support Team availability.  Plan to f/u by phone to check on anxiety level and offer further support, but please also page as needs arise.  Thank you.  [place], [place], [facility] Pager [phone] Voicemail  [phone]

## 2016-01-24 ENCOUNTER — Telehealth: Payer: Self-pay | Admitting: *Deleted

## 2016-01-24 NOTE — Telephone Encounter (Signed)
Left vm for pt to return call regarding [place] from 01/19/16. Contact information provided.

## 2016-02-04 NOTE — Addendum Note (Signed)
Encounter addended by: Kyung Rudd, MD on: 02/04/2016  9:32 PM<BR>     Documentation filed: Follow-up Section, LOS Section

## 2016-02-07 ENCOUNTER — Telehealth: Payer: Self-pay | Admitting: *Deleted

## 2016-02-07 NOTE — Telephone Encounter (Signed)
Received notification that pt genetics are negative. Pt has decided not to get a second opinion and would like to proceed with sx. Msg sent to care team and sx scheduler for Dr. Barry Dienes. Denies further questions at this time.

## 2016-02-11 ENCOUNTER — Other Ambulatory Visit: Payer: Self-pay | Admitting: *Deleted

## 2016-02-11 ENCOUNTER — Telehealth: Payer: Self-pay | Admitting: Hematology and Oncology

## 2016-02-11 NOTE — Telephone Encounter (Signed)
lvm to inform patient of 6/21 appt at 1030 am

## 2016-02-17 ENCOUNTER — Encounter: Payer: Self-pay | Admitting: General Practice

## 2016-02-17 NOTE — Progress Notes (Signed)
Spiritual Care Note  Followed up with Allison Mcclure by phone.  She was in good spirits, reporting less distress, better sleep, eagerness for surgery, and good support.  She named confusion about identifying which bills need to be paid (so many statements with various adjustments) and how to manage healthcare expenses; encouraged her to reach out to financial advocate and/or SW if difficulties persist.  Reminded her of ongoing Support Team availability for her and for her family.  Normalized feelings and provided empathic listening and encouragement.  She verbalized gratitude for call and connection, stating that she will be glad to reach out if needs arise.  Please also page if circumstances change.  Thank you.  [place], [place], [facility] Pager [phone] Voicemail [phone]

## 2016-02-23 ENCOUNTER — Encounter (HOSPITAL_BASED_OUTPATIENT_CLINIC_OR_DEPARTMENT_OTHER): Payer: Self-pay | Admitting: *Deleted

## 2016-03-01 ENCOUNTER — Ambulatory Visit (HOSPITAL_BASED_OUTPATIENT_CLINIC_OR_DEPARTMENT_OTHER): Payer: Managed Care, Other (non HMO) | Admitting: Anesthesiology

## 2016-03-01 ENCOUNTER — Encounter (HOSPITAL_BASED_OUTPATIENT_CLINIC_OR_DEPARTMENT_OTHER): Payer: Self-pay | Admitting: Anesthesiology

## 2016-03-01 ENCOUNTER — Ambulatory Visit (HOSPITAL_BASED_OUTPATIENT_CLINIC_OR_DEPARTMENT_OTHER)
Admission: RE | Admit: 2016-03-01 | Discharge: 2016-03-01 | Disposition: A | Discharge status: Home / Self Care | Payer: Managed Care, Other (non HMO) | Source: Ambulatory Visit | Attending: General Surgery | Admitting: General Surgery

## 2016-03-01 ENCOUNTER — Encounter (HOSPITAL_BASED_OUTPATIENT_CLINIC_OR_DEPARTMENT_OTHER)
Admission: RE | Disposition: A | Discharge status: Home / Self Care | Payer: Self-pay | Source: Ambulatory Visit | Attending: General Surgery

## 2016-03-01 ENCOUNTER — Ambulatory Visit (HOSPITAL_COMMUNITY)
Admission: RE | Admit: 2016-03-01 | Discharge: 2016-03-01 | Disposition: A | Discharge status: Home / Self Care | Payer: Managed Care, Other (non HMO) | Source: Ambulatory Visit | Attending: General Surgery | Admitting: General Surgery

## 2016-03-01 DIAGNOSIS — D1723 Benign lipomatous neoplasm of skin and subcutaneous tissue of right leg: Secondary | ICD-10-CM | POA: Insufficient documentation

## 2016-03-01 DIAGNOSIS — C50411 Malignant neoplasm of upper-outer quadrant of right female breast: Secondary | ICD-10-CM | POA: Insufficient documentation

## 2016-03-01 HISTORY — PX: BREAST LUMPECTOMY WITH RADIOACTIVE SEED AND SENTINEL LYMPH NODE BIOPSY: SHX6550

## 2016-03-01 HISTORY — DX: Irritable bowel syndrome, unspecified: K58.9

## 2016-03-01 HISTORY — DX: Gastro-esophageal reflux disease without esophagitis: K21.9

## 2016-03-01 HISTORY — PX: EXCISION MASS LOWER EXTREMETIES: SHX6705

## 2016-03-01 SURGERY — BREAST LUMPECTOMY WITH RADIOACTIVE SEED AND SENTINEL LYMPH NODE BIOPSY
Anesthesia: General | Site: Leg Upper | Laterality: Right

## 2016-03-01 MED ORDER — MIDAZOLAM HCL 2 MG/2ML IJ SOLN
INTRAMUSCULAR | Status: AC
  Filled 2016-03-01: qty 2

## 2016-03-01 MED ORDER — SODIUM CHLORIDE 0.9% FLUSH: 3.0000 mL | INTRAVENOUS | Status: DC | PRN

## 2016-03-01 MED ORDER — FENTANYL CITRATE (PF) 100 MCG/2ML IJ SOLN
INTRAMUSCULAR | Status: DC | PRN
  Administered 2016-03-01: 100 ug via INTRAVENOUS
  Administered 2016-03-01: 25 ug via INTRAVENOUS

## 2016-03-01 MED ORDER — PROPOFOL 10 MG/ML IV BOLUS
INTRAVENOUS | Status: AC
  Filled 2016-03-01: qty 40

## 2016-03-01 MED ORDER — SODIUM CHLORIDE 0.9 % IV SOLN: 250.0000 mL | INTRAVENOUS | Status: DC | PRN

## 2016-03-01 MED ORDER — SODIUM CHLORIDE 0.9% FLUSH: 3.0000 mL | Freq: Two times a day (BID) | INTRAVENOUS | Status: DC

## 2016-03-01 MED ORDER — OXYCODONE-ACETAMINOPHEN 5-325 MG PO TABS: 1.0000 | ORAL_TABLET | ORAL | Status: DC | PRN

## 2016-03-01 MED ORDER — ONDANSETRON HCL 4 MG/2ML IJ SOLN
INTRAMUSCULAR | Status: AC
  Filled 2016-03-01: qty 2

## 2016-03-01 MED ORDER — MEPERIDINE HCL 25 MG/ML IJ SOLN: 6.2500 mg | INTRAMUSCULAR | Status: DC | PRN

## 2016-03-01 MED ORDER — BUPIVACAINE-EPINEPHRINE 0.25% -1:200000 IJ SOLN
INTRAMUSCULAR | Status: DC | PRN
  Administered 2016-03-01: 10 mL

## 2016-03-01 MED ORDER — PROPOFOL 10 MG/ML IV BOLUS
INTRAVENOUS | Status: DC | PRN
  Administered 2016-03-01: 200 mg via INTRAVENOUS

## 2016-03-01 MED ORDER — CEFAZOLIN SODIUM-DEXTROSE 2-4 GM/100ML-% IV SOLN
INTRAVENOUS | Status: AC
  Filled 2016-03-01: qty 100

## 2016-03-01 MED ORDER — SCOPOLAMINE 1 MG/3DAYS TD PT72: 1.0000 | MEDICATED_PATCH | Freq: Once | TRANSDERMAL | Status: DC | PRN

## 2016-03-01 MED ORDER — DEXAMETHASONE SODIUM PHOSPHATE 10 MG/ML IJ SOLN
INTRAMUSCULAR | Status: AC
  Filled 2016-03-01: qty 1

## 2016-03-01 MED ORDER — LIDOCAINE 2% (20 MG/ML) 5 ML SYRINGE
INTRAMUSCULAR | Status: AC
  Filled 2016-03-01: qty 5

## 2016-03-01 MED ORDER — LACTATED RINGERS IV SOLN
INTRAVENOUS | Status: DC | PRN
  Administered 2016-03-01 (×2): via INTRAVENOUS

## 2016-03-01 MED ORDER — BUPIVACAINE-EPINEPHRINE (PF) 0.5% -1:200000 IJ SOLN
INTRAMUSCULAR | Status: DC | PRN
  Administered 2016-03-01: 25 mL via PERINEURAL

## 2016-03-01 MED ORDER — OXYCODONE HCL 5 MG PO TABS
ORAL_TABLET | ORAL | Status: AC
  Filled 2016-03-01: qty 1

## 2016-03-01 MED ORDER — LACTATED RINGERS IV SOLN
INTRAVENOUS | Status: DC
  Administered 2016-03-01 (×2): via INTRAVENOUS

## 2016-03-01 MED ORDER — HYDROMORPHONE HCL 1 MG/ML IJ SOLN
INTRAMUSCULAR | Status: AC
  Filled 2016-03-01: qty 1

## 2016-03-01 MED ORDER — DEXAMETHASONE SODIUM PHOSPHATE 4 MG/ML IJ SOLN
INTRAMUSCULAR | Status: DC | PRN
  Administered 2016-03-01: 10 mg via INTRAVENOUS

## 2016-03-01 MED ORDER — OXYCODONE HCL 5 MG PO TABS: 5.0000 mg | ORAL_TABLET | Freq: Once | ORAL | Status: DC | PRN

## 2016-03-01 MED ORDER — LIDOCAINE HCL (CARDIAC) 20 MG/ML IV SOLN
INTRAVENOUS | Status: DC | PRN
  Administered 2016-03-01: 30 mg via INTRAVENOUS

## 2016-03-01 MED ORDER — GLYCOPYRROLATE 0.2 MG/ML IV SOSY
PREFILLED_SYRINGE | INTRAVENOUS | Status: AC
  Filled 2016-03-01: qty 3

## 2016-03-01 MED ORDER — MIDAZOLAM HCL 5 MG/5ML IJ SOLN
INTRAMUSCULAR | Status: DC | PRN
  Administered 2016-03-01: 2 mg via INTRAVENOUS

## 2016-03-01 MED ORDER — PHENYLEPHRINE HCL 10 MG/ML IJ SOLN
INTRAMUSCULAR | Status: DC | PRN
  Administered 2016-03-01: 40 ug via INTRAVENOUS

## 2016-03-01 MED ORDER — TECHNETIUM TC 99M SULFUR COLLOID FILTERED
1.0000 | Freq: Once | INTRAVENOUS | Status: AC | PRN
  Administered 2016-03-01: 1 via INTRADERMAL

## 2016-03-01 MED ORDER — FENTANYL CITRATE (PF) 100 MCG/2ML IJ SOLN
INTRAMUSCULAR | Status: AC
  Filled 2016-03-01: qty 2

## 2016-03-01 MED ORDER — OXYCODONE HCL 5 MG/5ML PO SOLN: 5.0000 mg | Freq: Once | ORAL | Status: DC | PRN

## 2016-03-01 MED ORDER — OXYCODONE HCL 5 MG PO TABS
5.0000 mg | ORAL_TABLET | ORAL | Status: DC | PRN
Start: 2016-03-01 — End: 2016-03-01
  Administered 2016-03-01: 5 mg via ORAL

## 2016-03-01 MED ORDER — MIDAZOLAM HCL 2 MG/2ML IJ SOLN
1.0000 mg | INTRAMUSCULAR | Status: DC | PRN
  Administered 2016-03-01 (×2): 1 mg via INTRAVENOUS

## 2016-03-01 MED ORDER — FENTANYL CITRATE (PF) 100 MCG/2ML IJ SOLN
50.0000 ug | INTRAMUSCULAR | Status: DC | PRN
  Administered 2016-03-01 (×2): 50 ug via INTRAVENOUS

## 2016-03-01 MED ORDER — ONDANSETRON HCL 4 MG/2ML IJ SOLN
INTRAMUSCULAR | Status: DC | PRN
  Administered 2016-03-01: 4 mg via INTRAVENOUS

## 2016-03-01 MED ORDER — HYDROMORPHONE HCL 1 MG/ML IJ SOLN
0.2500 mg | INTRAMUSCULAR | Status: DC | PRN
  Administered 2016-03-01 (×2): 0.5 mg via INTRAVENOUS

## 2016-03-01 MED ORDER — PHENYLEPHRINE 40 MCG/ML (10ML) SYRINGE FOR IV PUSH (FOR BLOOD PRESSURE SUPPORT)
PREFILLED_SYRINGE | INTRAVENOUS | Status: AC
  Filled 2016-03-01: qty 10

## 2016-03-01 MED ORDER — CEFAZOLIN SODIUM-DEXTROSE 2-4 GM/100ML-% IV SOLN
2.0000 g | INTRAVENOUS | Status: AC
  Administered 2016-03-01: 2 g via INTRAVENOUS

## 2016-03-01 MED ORDER — GLYCOPYRROLATE 0.2 MG/ML IJ SOLN
0.2000 mg | Freq: Once | INTRAMUSCULAR | Status: AC | PRN
  Administered 2016-03-01: 0.2 mg via INTRAVENOUS

## 2016-03-01 MED ORDER — ACETAMINOPHEN 325 MG PO TABS: 650.0000 mg | ORAL_TABLET | ORAL | Status: DC | PRN

## 2016-03-01 MED ORDER — ACETAMINOPHEN 650 MG RE SUPP: 650.0000 mg | RECTAL | Status: DC | PRN

## 2016-03-01 SURGICAL SUPPLY — 72 items
APPLIER CLIP 9.375 MED OPEN (MISCELLANEOUS)
BINDER BREAST LRG (GAUZE/BANDAGES/DRESSINGS) IMPLANT
BINDER BREAST MEDIUM (GAUZE/BANDAGES/DRESSINGS) ×4 IMPLANT
BINDER BREAST XLRG (GAUZE/BANDAGES/DRESSINGS) IMPLANT
BINDER BREAST XXLRG (GAUZE/BANDAGES/DRESSINGS) IMPLANT
BLADE SURG 10 STRL SS (BLADE) ×4 IMPLANT
BLADE SURG 15 STRL LF DISP TIS (BLADE) ×2 IMPLANT
BLADE SURG 15 STRL SS (BLADE) ×2
BNDG COHESIVE 4X5 TAN STRL (GAUZE/BANDAGES/DRESSINGS) ×4 IMPLANT
CANISTER SUC SOCK COL 7IN (MISCELLANEOUS) IMPLANT
CANISTER SUCT 1200ML W/VALVE (MISCELLANEOUS) ×8 IMPLANT
CHLORAPREP W/TINT 26ML (MISCELLANEOUS) ×4 IMPLANT
CLIP APPLIE 9.375 MED OPEN (MISCELLANEOUS) IMPLANT
CLIP TI LARGE 6 (CLIP) ×4 IMPLANT
CLIP TI MEDIUM 6 (CLIP) ×16 IMPLANT
CLIP TI WIDE RED SMALL 6 (CLIP) IMPLANT
CLOSURE WOUND 1/2 X4 (GAUZE/BANDAGES/DRESSINGS) ×1
COVER MAYO STAND STRL (DRAPES) ×4 IMPLANT
COVER PROBE W GEL 5X96 (DRAPES) ×4 IMPLANT
DECANTER SPIKE VIAL GLASS SM (MISCELLANEOUS) IMPLANT
DERMABOND ADVANCED (GAUZE/BANDAGES/DRESSINGS) ×4
DERMABOND ADVANCED .7 DNX12 (GAUZE/BANDAGES/DRESSINGS) ×4 IMPLANT
DEVICE DUBIN W/COMP PLATE 8390 (MISCELLANEOUS) ×4 IMPLANT
DRAPE LAPAROTOMY 100X72 PEDS (DRAPES) ×4 IMPLANT
DRAPE UTILITY XL STRL (DRAPES) ×8 IMPLANT
DRSG PAD ABDOMINAL 8X10 ST (GAUZE/BANDAGES/DRESSINGS) ×4 IMPLANT
ELECT COATED BLADE 2.86 ST (ELECTRODE) ×4 IMPLANT
ELECT REM PT RETURN 9FT ADLT (ELECTROSURGICAL) ×4
ELECTRODE REM PT RTRN 9FT ADLT (ELECTROSURGICAL) ×2 IMPLANT
GAUZE SPONGE 4X4 12PLY STRL (GAUZE/BANDAGES/DRESSINGS) ×4 IMPLANT
GLOVE BIO SURGEON STRL SZ 6 (GLOVE) ×4 IMPLANT
GLOVE BIO SURGEON STRL SZ 6.5 (GLOVE) ×3 IMPLANT
GLOVE BIO SURGEONS STRL SZ 6.5 (GLOVE) ×1
GLOVE BIOGEL M STRL SZ7.5 (GLOVE) ×4 IMPLANT
GLOVE BIOGEL PI IND STRL 6.5 (GLOVE) ×4 IMPLANT
GLOVE BIOGEL PI IND STRL 8 (GLOVE) ×2 IMPLANT
GLOVE BIOGEL PI INDICATOR 6.5 (GLOVE) ×4
GLOVE BIOGEL PI INDICATOR 8 (GLOVE) ×2
GOWN STRL REUS W/ TWL LRG LVL3 (GOWN DISPOSABLE) ×2 IMPLANT
GOWN STRL REUS W/TWL 2XL LVL3 (GOWN DISPOSABLE) ×4 IMPLANT
GOWN STRL REUS W/TWL LRG LVL3 (GOWN DISPOSABLE) ×2
ILLUMINATOR WAVEGUIDE N/F (MISCELLANEOUS) IMPLANT
KIT MARKER MARGIN INK (KITS) ×4 IMPLANT
LIGHT WAVEGUIDE WIDE FLAT (MISCELLANEOUS) IMPLANT
NDL SAFETY ECLIPSE 18X1.5 (NEEDLE) IMPLANT
NEEDLE HYPO 18GX1.5 SHARP (NEEDLE)
NEEDLE HYPO 25X1 1.5 SAFETY (NEEDLE) ×4 IMPLANT
NS IRRIG 1000ML POUR BTL (IV SOLUTION) ×4 IMPLANT
PACK BASIN DAY SURGERY FS (CUSTOM PROCEDURE TRAY) ×4 IMPLANT
PACK UNIVERSAL I (CUSTOM PROCEDURE TRAY) ×8 IMPLANT
PENCIL BUTTON HOLSTER BLD 10FT (ELECTRODE) ×4 IMPLANT
SLEEVE SCD COMPRESS KNEE MED (MISCELLANEOUS) ×4 IMPLANT
SPONGE GAUZE 4X4 12PLY STER LF (GAUZE/BANDAGES/DRESSINGS) ×4 IMPLANT
SPONGE LAP 18X18 X RAY DECT (DISPOSABLE) ×8 IMPLANT
STAPLER VISISTAT 35W (STAPLE) IMPLANT
STOCKINETTE IMPERVIOUS LG (DRAPES) ×4 IMPLANT
STRIP CLOSURE SKIN 1/2X4 (GAUZE/BANDAGES/DRESSINGS) ×3 IMPLANT
SUT ETHILON 2 0 FS 18 (SUTURE) IMPLANT
SUT MNCRL AB 4-0 PS2 18 (SUTURE) ×8 IMPLANT
SUT MON AB 5-0 PS2 18 (SUTURE) IMPLANT
SUT SILK 2 0 SH (SUTURE) IMPLANT
SUT VIC AB 2-0 SH 27 (SUTURE) ×2
SUT VIC AB 2-0 SH 27XBRD (SUTURE) ×2 IMPLANT
SUT VIC AB 3-0 SH 27 (SUTURE) ×4
SUT VIC AB 3-0 SH 27X BRD (SUTURE) ×4 IMPLANT
SUT VIC AB 5-0 PS2 18 (SUTURE) IMPLANT
SYR CONTROL 10ML LL (SYRINGE) ×4 IMPLANT
TOWEL OR 17X24 6PK STRL BLUE (TOWEL DISPOSABLE) ×4 IMPLANT
TOWEL OR NON WOVEN STRL DISP B (DISPOSABLE) ×4 IMPLANT
TUBE CONNECTING 20'X1/4 (TUBING) ×1
TUBE CONNECTING 20X1/4 (TUBING) ×3 IMPLANT
YANKAUER SUCT BULB TIP NO VENT (SUCTIONS) ×4 IMPLANT

## 2016-03-01 NOTE — Progress Notes (Signed)
Assisted Dr. Crews with right, ultrasound guided, pectoralis block. Side rails up, monitors on throughout procedure. See vital signs in flow sheet. Tolerated Procedure well. 

## 2016-03-01 NOTE — Anesthesia Procedure Notes (Addendum)
Anesthesia Regional Block:  Pectoralis block  Pre-Anesthetic Checklist: ,, timeout performed, Correct Patient, Correct Site, Correct Laterality, Correct Procedure, Correct Position, site marked, Risks and benefits discussed,  Surgical consent,  Pre-op evaluation,  At surgeon's request and post-op pain management  Laterality: Right and Upper  Prep: chloraprep       Needles:  Injection technique: Single-shot  Needle Type: Echogenic Needle     Needle Length: 9cm 9 cm Needle Gauge: 21 and 21 G    Additional Needles:  Procedures: ultrasound guided (picture in chart) Pectoralis block Narrative:  Start time: 03/01/2016 8:57 AM End time: 03/01/2016 9:03 AM Injection made incrementally with aspirations every 5 mL.  Performed by: Personally  Anesthesiologist: CREWS, DAVID   Procedure Name: LMA Insertion Date/Time: 03/01/2016 9:23 AM Performed by: Marrianne Mood Pre-anesthesia Checklist: Patient identified, Emergency Drugs available, Suction available, Patient being monitored and Timeout performed Patient Re-evaluated:Patient Re-evaluated prior to inductionOxygen Delivery Method: Circle system utilized Preoxygenation: Pre-oxygenation with 100% oxygen Intubation Type: IV induction Ventilation: Mask ventilation without difficulty LMA: LMA inserted LMA Size: 4.0 Number of attempts: 1 Airway Equipment and Method: Bite block Placement Confirmation: positive ETCO2 Tube secured with: Tape Dental Injury: Teeth and Oropharynx as per pre-operative assessment       Right PEC block image

## 2016-03-01 NOTE — Progress Notes (Signed)
Nuc med staff performed injection. Sedation given (see MAR) and VSS (see flowsheet). Pt tol well

## 2016-03-01 NOTE — Progress Notes (Signed)
Nuc med staff perfomed injection. VSS, Sedation given, Pt tol well.

## 2016-03-01 NOTE — H&P (Signed)
Allison Mcclure  Location: [facility] Patient #: [id number] DOB: 1975/10/18 Undefined / Language: Cleophus Molt / Race: White Female   History of Present Illness  The patient is a 40 year old female who presents with breast cancer. Pt is a 40 yo F referred by Dr. Kathyrn Lass for consultation regarding new diagnosis of right breast cancer. She had screening detected asymmetry in the upper outer quadrant of the right breast. Diagnostic imaging revealed an 8 mm mass in the posterior breast at 11 o'clock. Core needle biopsy was performed and showed grade 1-2 invasive ductal carcinoma with LVI. This was ER/PR positive, Her 2 negative, and Ki67 was 10%. The patient has been getting mammograms since age 40. This is her first callback. Pt states that her breasts are tender now that she has taken out her Nuvaring and had a normal period.   Pt also has had a painful mass on her right anterior thigh. it has not gotten larger, but has been more sore recently. It has been there for years.   She had menarche at age 40. She has taken OCPs. She has a family history of colon polyps, and her father has had skin cancer and prostate cancer. Her paternal grandfather bone marrow cancer and her paternal great grandfather had colon cancer.   dx mammogram/ultrasound New 8 mm lobulated solid mass in the right breast at 10 o'clock posterior depth ([place]) 5 mm oval focal asymmetric denstiy at 11 o'clock is indeterminate.   Diagnosis Breast, right, needle core biopsy - INVASIVE DUCTAL CARCINOMA. - LYMPHOVASCULAR INVASION IS IDENTIFIED. - SEE COMMENT. Microscopic Comment The carcinoma appears at least grade 2.  Labs CBC and CMET essentially normal.      Review of Systems All other systems negative  Vitals  Weight: 117.6 lb Height: 62in Body Surface Area: 1.53 m Body Mass Index: 21.51 kg/m  Temp.: 98.65F  Pulse: 86 (Regular)  Resp.: 20 (Unlabored)  BP: 135/80  (Sitting, Left Arm, Standard)   Past Medical History  Diagnosis Date  . Anxiety   . Cervical dysplasia   . Gastrointestinal disorder   . Ovarian cyst   . Breast cancer ([place])   . GERD (gastroesophageal reflux disease)   . IBS (irritable bowel syndrome)    Past Surgical History  Procedure Laterality Date  . Arm surgery      left  . Cryotherapy    . Tonsillectomy    . Wisdom tooth extraction    . Mouth surgery      "gum surgery" x 2   Family History  Problem Relation Age of Onset  . Hyperlipidemia Father   . Prostate cancer Father   . Hypertension Father   . Hypertension Mother   . Hyperlipidemia Mother   . Colon cancer Paternal Grandfather    Social History   Social History  . Marital Status: Unknown    Spouse Name: N/A  . Number of Children: N/A  . Years of Education: N/A   Occupational History  . Not on file.   Social History Main Topics  . Smoking status: Never Smoker   . Smokeless tobacco: Never Used  . Alcohol Use: Yes     Comment: 0-1 drinks daily  . Drug Use: No  . Sexual Activity: Not on file   Other Topics Concern  . Not on file   Social History Narrative       Physical Exam General Mental Status-Alert. General Appearance-Consistent with stated age. Hydration-Well hydrated. Voice-Normal.  Integumentary Note: fatty tumor  on anteromedial right thigh about halfway between groin and knee. mobile. soft. mildly tender.   Head and Neck Head-normocephalic, atraumatic with no lesions or palpable masses. Trachea-midline. Thyroid Gland Characteristics - normal size and consistency.  Eye Eyeball - Bilateral-Extraocular movements intact. Sclera/Conjunctiva - Bilateral-No scleral icterus.  Chest and Lung Exam Chest and lung exam reveals -quiet, even and easy respiratory effort with no use of accessory muscles and on auscultation, normal breath sounds, no adventitious sounds and normal vocal resonance. Inspection Chest  Wall - Normal. Back - normal.  Breast Note: symmetric bilaterally. mild ptosis. heterogeneously dense breast tissue. no palpable masses. Breasts tender throughout. no LAD.   Cardiovascular Cardiovascular examination reveals -normal heart sounds, regular rate and rhythm with no murmurs and normal pedal pulses bilaterally.  Abdomen Inspection Inspection of the abdomen reveals - No Hernias. Palpation/Percussion Palpation and Percussion of the abdomen reveal - Soft, Non Tender, No Rebound tenderness, No Rigidity (guarding) and No hepatosplenomegaly. Auscultation Auscultation of the abdomen reveals - Bowel sounds normal.  Neurologic Neurologic evaluation reveals -alert and oriented x 3 with no impairment of recent or remote memory. Mental Status-Normal.  Musculoskeletal Global Assessment -Note: no gross deformities.  Normal Exam - Left-Upper Extremity Strength Normal and Lower Extremity Strength Normal. Normal Exam - Right-Upper Extremity Strength Normal and Lower Extremity Strength Normal.  Lymphatic Head & Neck  General Head & Neck Lymphatics: Bilateral - Description - Normal. Axillary  General Axillary Region: Bilateral - Description - Normal. Tenderness - Non Tender. Femoral & Inguinal  Generalized Femoral & Inguinal Lymphatics: Bilateral - Description - No Generalized lymphadenopathy.    Assessment & Plan CARCINOMA OF BREAST UPPER OUTER QUADRANT, RIGHT (C50.411) Impression: Patient has a new diagnosis of cT1bN0 right breast cancer. We will set her up for an MRI given her age and breast density. Also, we will wait for genetic testing results.  If that is negative for additional or widespread findings, she is a good candidate for breast conservation with seed localized lumpectomy and right SLN bx. She currently is planning on lumpectomy if genetics are negative. This would be followed by adjuvant radiation, oncotype, and hormonal chemoprevention.  The  surgical procedure was described to the patient. I discussed the incision type and location and that we would need radiology involved on with a wire or seed marker and/or sentinel node.  The risks and benefits of the procedure were described to the patient and her family. They would be helping take care of her post op. They live in [place].  We discussed the risks bleeding, infection, damage to other structures, need for further procedures/surgeries. We discussed the risk of seroma. The patient was advised if the area in the breast in cancer, we may need to go back to surgery for additional tissue to obtain negative margins or for a lymph node biopsy. The patient was advised that these are the most common complications, but that others can occur as well. They were advised against taking aspirin or other anti-inflammatory agents/blood thinners the week before surgery.  60 min spent in evaluation, examination, counseling, and coordination of care. >50% spent in counseling. MASS OF RIGHT THIGH (R22.41) Impression: I advised the patient that I can take the mass out at the time of surgery and it will add minimal time to her recovery. I reviewed risks of bleeding, infection, numbness.    Signed by Stark Klein, MD

## 2016-03-01 NOTE — Discharge Instructions (Addendum)
[facility] °Office Phone Number [phone] ° °BREAST BIOPSY/ PARTIAL MASTECTOMY: POST OP INSTRUCTIONS ° °Always review your discharge instruction sheet given to you by the facility where your surgery was performed. ° °IF YOU HAVE DISABILITY OR FAMILY LEAVE FORMS, YOU MUST BRING THEM TO THE OFFICE FOR PROCESSING.  DO NOT GIVE THEM TO YOUR DOCTOR. ° °1. A prescription for pain medication may be given to you upon discharge.  Take your pain medication as prescribed, if needed.  If narcotic pain medicine is not needed, then you may take acetaminophen (Tylenol) or ibuprofen (Advil) as needed. °2. Take your usually prescribed medications unless otherwise directed °3. If you need a refill on your pain medication, please contact your pharmacy.  They will contact our office to request authorization.  Prescriptions will not be filled after 5pm or on week-ends. °4. You should eat very light the first 24 hours after surgery, such as soup, crackers, pudding, etc.  Resume your normal diet the day after surgery. °5. Most patients will experience some swelling and bruising in the breast.  Ice packs and a good support bra will help.  Swelling and bruising can take several days to resolve.  °6. It is common to experience some constipation if taking pain medication after surgery.  Increasing fluid intake and taking a stool softener will usually help or prevent this problem from occurring.  A mild laxative (Milk of Magnesia or Miralax) should be taken according to package directions if there are no bowel movements after 48 hours. °7. Unless discharge instructions indicate otherwise, you may remove your bandages 48 hours after surgery, and you may shower at that time.  You may have steri-strips (small skin tapes) in place directly over the incision.  These strips should be left on the skin for 7-10 days.   Any sutures or staples will be removed at the office during your follow-up visit. °8. ACTIVITIES:  You may resume  regular daily activities (gradually increasing) beginning the next day.  Wearing a good support bra or sports bra (or the breast binder) minimizes pain and swelling.  You may have sexual intercourse when it is comfortable. °a. You may drive when you no longer are taking prescription pain medication, you can comfortably wear a seatbelt, and you can safely maneuver your car and apply brakes. °b. RETURN TO WORK:  __________1 week_______________ °9. You should see your doctor in the office for a follow-up appointment approximately two weeks after your surgery.  Your doctor’s nurse will typically make your follow-up appointment when she calls you with your pathology report.  Expect your pathology report 2-3 business days after your surgery.  You may call to check if you do not hear from us after three days. ° ° °WHEN TO CALL YOUR DOCTOR: °1. Fever over 101.0 °2. Nausea and/or vomiting. °3. Extreme swelling or bruising. °4. Continued bleeding from incision. °5. Increased pain, redness, or drainage from the incision. ° °The clinic staff is available to answer your questions during regular business hours.  Please don’t hesitate to call and ask to speak to one of the nurses for clinical concerns.  If you have a medical emergency, go to the nearest emergency room or call 911.  A surgeon from [facility] is always on call at the hospital. ° °For further questions, please visit [email or web address]  ° ° °Post Anesthesia Home Care Instructions ° °Activity: °Get plenty of rest for the remainder of the day. A responsible adult should stay with you for 24   hours following the procedure.  °For the next 24 hours, DO NOT: °-Drive a car °-Operate machinery °-Drink alcoholic beverages °-Take any medication unless instructed by your physician °-Make any legal decisions or sign important papers. ° °Meals: °Start with liquid foods such as gelatin or soup. Progress to regular foods as tolerated. Avoid greasy, spicy, heavy  foods. If nausea and/or vomiting occur, drink only clear liquids until the nausea and/or vomiting subsides. Call your physician if vomiting continues. ° °Special Instructions/Symptoms: °Your throat may feel dry or sore from the anesthesia or the breathing tube placed in your throat during surgery. If this causes discomfort, gargle with warm salt water. The discomfort should disappear within 24 hours. ° °If you had a scopolamine patch placed behind your ear for the management of post- operative nausea and/or vomiting: ° °1. The medication in the patch is effective for 72 hours, after which it should be removed.  Wrap patch in a tissue and discard in the trash. Wash hands thoroughly with soap and water. °2. You may remove the patch earlier than 72 hours if you experience unpleasant side effects which may include dry mouth, dizziness or visual disturbances. °3. Avoid touching the patch. Wash your hands with soap and water after contact with the patch. °  ° °

## 2016-03-01 NOTE — Anesthesia Preprocedure Evaluation (Signed)
Anesthesia Evaluation  Patient identified by MRN, date of birth, ID band Patient awake    Reviewed: Allergy & Precautions, NPO status , Patient's Chart, lab work & pertinent test results  Airway Mallampati: I  TM Distance: >3 FB Neck ROM: Full    Dental  (+) Teeth Intact, Dental Advisory Given   Pulmonary  breath sounds clear to auscultation        Cardiovascular Rhythm:Regular Rate:Normal     Neuro/Psych    GI/Hepatic GERD-  Medicated and Controlled,  Endo/Other    Renal/GU      Musculoskeletal   Abdominal   Peds  Hematology   Anesthesia Other Findings   Reproductive/Obstetrics                             Anesthesia Physical Anesthesia Plan  ASA: II  Anesthesia Plan: General   Post-op Pain Management:    Induction: Intravenous  Airway Management Planned: LMA  Additional Equipment:   Intra-op Plan:   Post-operative Plan: Extubation in OR  Informed Consent: I have reviewed the patients History and Physical, chart, labs and discussed the procedure including the risks, benefits and alternatives for the proposed anesthesia with the patient or authorized representative who has indicated his/her understanding and acceptance.   Dental advisory given  Plan Discussed with: CRNA, Anesthesiologist and Surgeon  Anesthesia Plan Comments:         Anesthesia Quick Evaluation  

## 2016-03-01 NOTE — Transfer of Care (Signed)
Immediate Anesthesia Transfer of Care Note  Patient: Allison Mcclure  Procedure(s) Performed: Procedure(s): RIGHT BREAST LUMPECTOMY WITH RADIOACTIVE SEED AND SENTINEL LYMPH NODE BIOPSY (Right) EXCISION MASS RIGHT THIGH (Right)  Patient Location: PACU  Anesthesia Type:GA combined with regional for post-op pain  Level of Consciousness: awake and patient cooperative  Airway & Oxygen Therapy: Patient Spontanous Breathing and Patient connected to face mask oxygen  Post-op Assessment: Report given to RN and Post -op Vital signs reviewed and stable  Post vital signs: Reviewed and stable  Last Vitals:  Filed Vitals:   03/01/16 0855 03/01/16 0900  BP: 117/70 113/59  Pulse: 82 82  Temp:    Resp: 19 27    Last Pain: There were no vitals filed for this visit.       Complications: No apparent anesthesia complications

## 2016-03-01 NOTE — Anesthesia Postprocedure Evaluation (Signed)
Anesthesia Post Note  Patient: KEONTA BYFIELD  Procedure(s) Performed: Procedure(s) (LRB): RIGHT BREAST LUMPECTOMY WITH RADIOACTIVE SEED AND SENTINEL LYMPH NODE BIOPSY (Right) EXCISION MASS RIGHT THIGH (Right)  Patient location during evaluation: PACU Anesthesia Type: General Level of consciousness: awake and alert Pain management: pain level controlled Vital Signs Assessment: post-procedure vital signs reviewed and stable Respiratory status: spontaneous breathing, nonlabored ventilation, respiratory function stable and patient connected to nasal cannula oxygen Cardiovascular status: blood pressure returned to baseline and stable Postop Assessment: no signs of nausea or vomiting Anesthetic complications: no    Last Vitals:  Filed Vitals:   03/01/16 1145 03/01/16 1157  BP: 107/67   Pulse: 72 69  Temp:  36.4 C  Resp: 18 16    Last Pain:  Filed Vitals:   03/01/16 1158  PainSc: 4                  Patte Winkel A

## 2016-03-01 NOTE — Op Note (Signed)
Right Breast Radioactive seed localized lumpectomy and sentinel lymph node biopsy, excision of right anterior thigh mass, subcutaneous, 1.5 cm  Indications: This patient presents with history of right breast cancer, cT1bN0 and a painful right thigh mass  Pre-operative Diagnosis: Right breast cancer upper outer quadrant and right thigh mass  Post-operative Diagnosis: Same  Surgeon: Stark Klein   Anesthesia: General endotracheal anesthesia, local, and regional  ASA Class: 2  Procedure Details  The patient was seen in the Holding Room. The risks, benefits, complications, treatment options, and expected outcomes were discussed with the patient. The possibilities of bleeding, infection, the need for additional procedures, failure to diagnose a condition, and creating a complication requiring transfusion or operation were discussed with the patient. The patient concurred with the proposed plan, giving informed consent.  The site of surgery properly noted/marked. The patient was taken to Operating Room # 8, identified, and the procedure verified as Right Breast Seed localized Lumpectomy with sentinel lymph node biopsy and excision or right anterior thigh mass. A Time Out was held and the above information confirmed.  A vertical incision was made over the thigh mass approximately 2 cm in length.  The subcutaneous tissues were spread with a hemostat.  The mass popped out into the incision.  This appeared clinically to be a lipoma.  The connecting tissues were divided with cautery.  The incision was then infiltrated with local.  The skin was irrigated.  This was closed with 3-0 vicryl deep dermal tissue and 4-0 monocryl subcutaneous tissue.    The right arm, breast, chest and right anterior thigh were prepped and draped in standard fashion. The lumpectomy was performed by creating an oblique incision in the axillary location close to the radioactive seed.  Dissection was carried down to around the point of  maximum signal intensity. The cautery was used to perform the dissection.  Hemostasis was achieved with cautery. The edges of the cavity were marked with large clips, with one each medial, lateral, inferior and superior, and two clips posteriorly.   The specimen was inked with the margin marker paint kit.    Specimen radiography confirmed inclusion of the mammographic lesion, the clip, and the seed.  The background signal in the breast was zero. Some of the breast tissue was released from the skin in order to get it to have less of a noticeable defect.    The same incision was used for the sentinel node biopsy.   Dissection was carried through the clavipectoral fascia.  Three level two axillary sentinel nodes were removed.  Counts per second were 1040, 250, and 90.    The background count was 5 cps.  The wound was irrigated.  Hemostasis was achieved with cautery.  The axillary incision was closed with a 3-0 vicryl deep dermal interrupted sutures and a 4-0 monocryl subcuticular closure.    Sterile dressings were applied. At the end of the operation, all sponge, instrument, and needle counts were correct.  Findings: grossly clear surgical margins and no adenopathy.  Anterior and lateral margins are skin, posterior margin is pectoralis.    Estimated Blood Loss:  min         Specimens: right anterior thigh mass, right breast lumpectomy and three right axillary sentinel lymph nodes.             Complications:  None; patient tolerated the procedure well.         Disposition: PACU - hemodynamically stable.         Condition:  stable

## 2016-03-01 NOTE — Interval H&P Note (Signed)
History and Physical Interval Note:  03/01/2016 9:04 AM  Allison Mcclure  has presented today for surgery, with the diagnosis of RIGHT BREAST CANCER/RIGHT THIGH MASS  The various methods of treatment have been discussed with the patient and family. After consideration of risks, benefits and other options for treatment, the patient has consented to  Procedure(s): RIGHT BREAST LUMPECTOMY WITH RADIOACTIVE SEED AND SENTINEL LYMPH NODE BIOPSY (Right) EXCISION MASS RIGHT THIGH (Right) as a surgical intervention .  The patient's history has been reviewed, patient examined, no change in status, stable for surgery.  I have reviewed the patient's chart and labs.  Questions were answered to the patient's satisfaction.     Callia Swim

## 2016-03-01 NOTE — Progress Notes (Signed)
Nuc med staff performed injection. Sedation given (see MAR) and pt tol well. VSS (see flowsheets).

## 2016-03-02 ENCOUNTER — Encounter (HOSPITAL_BASED_OUTPATIENT_CLINIC_OR_DEPARTMENT_OTHER): Payer: Self-pay | Admitting: General Surgery

## 2016-03-03 ENCOUNTER — Telehealth: Payer: Self-pay | Admitting: General Surgery

## 2016-03-03 NOTE — Telephone Encounter (Signed)
Reviewed pathology with patient.    LN and margins negative.    She has follow up appt.7/3

## 2016-03-06 ENCOUNTER — Encounter: Payer: Self-pay | Admitting: Hematology and Oncology

## 2016-03-06 NOTE — Progress Notes (Signed)
left in my box °

## 2016-03-08 ENCOUNTER — Encounter: Payer: Self-pay | Admitting: Hematology and Oncology

## 2016-03-08 ENCOUNTER — Ambulatory Visit (HOSPITAL_BASED_OUTPATIENT_CLINIC_OR_DEPARTMENT_OTHER): Payer: Managed Care, Other (non HMO) | Admitting: Hematology and Oncology

## 2016-03-08 VITALS — BP 122/74 | HR 86 | Temp 98.4°F | Resp 18 | Wt 119.1 lb

## 2016-03-08 DIAGNOSIS — C50411 Malignant neoplasm of upper-outer quadrant of right female breast: Secondary | ICD-10-CM

## 2016-03-08 NOTE — Progress Notes (Signed)
left for dr. Lindi Adie to sign

## 2016-03-08 NOTE — Progress Notes (Signed)
Patient Care Team: Kathyrn Lass, MD as PCP - General (Family Medicine)  DIAGNOSIS: Breast cancer of upper-outer quadrant of right female breast [facility])   Staging form: Breast, AJCC 7th Edition     Clinical stage from 01/19/2016: Stage IA (T1b, N0, M0) - Unsigned       Staging comments: Staged at breast conference on 5.3.17  SUMMARY OF ONCOLOGIC HISTORY:   Breast cancer of upper-outer quadrant of right female breast ([place])   01/03/2016 Initial Diagnosis Right breast biopsy: Invasive ductal carcinoma with LVI, grade 2,ER 100%, PR 100%, Ki-67 10%, HER-2 negative ratio 1.23   01/03/2016 Mammogram screening detected asymmetry in the right breast ultrasound measured 8 mm at 10:00 position axillary normal, T1b N0 stage IA   03/01/2016 Surgery Right lumpectomy: IDC grade 1, 0.7 cm, margins negative, 0/4 lymph nodes negative, ER 100%, PR 100%, Ki-67 10%, HER-2 negative ratio 1.32, T1 BN 0 stage IA    CHIEF COMPLIANT: Follow-up after recent lumpectomy  INTERVAL HISTORY: Allison Mcclure is a 40 year old with above-mentioned history of right breast cancer. Lumpectomy and is here for follow-up. She Complains of soreness of the surgical incision as well as slight swelling of the right breast. She also had a lipoma resected from the thigh. This is also slightly sore.  REVIEW OF SYSTEMS:   Constitutional: Denies fevers, chills or abnormal weight loss Eyes: Denies blurriness of vision Ears, nose, mouth, throat, and face: Denies mucositis or sore throat Respiratory: Denies cough, dyspnea or wheezes Cardiovascular: Denies palpitation, chest discomfort Gastrointestinal:  Denies nausea, heartburn or change in bowel habits Skin: Denies abnormal skin rashes Lymphatics: Denies new lymphadenopathy or easy bruising Neurological:Denies numbness, tingling or new weaknesses Behavioral/Psych: Mood is stable, no new changes  Extremities: No lower extremity edema Breast: Recent right lumpectomy All other systems  were reviewed with the patient and are negative.  I have reviewed the past medical history, past surgical history, social history and family history with the patient and they are unchanged from previous note.  ALLERGIES:  has No Known Allergies.  MEDICATIONS:  Current Outpatient Prescriptions  Medication Sig Dispense Refill  . ALPRAZolam (XANAX) 0.5 MG tablet Take 0.5 mg by mouth at bedtime as needed for anxiety.    . chlordiazePOXIDE (LIBRIUM) 10 MG capsule Take 5 mg by mouth daily.     Marland Kitchen loratadine (ALAVERT) 10 MG tablet Take 10 mg by mouth daily as needed.    Marland Kitchen omeprazole (PRILOSEC) 20 MG capsule TAKE 1 CAPUSLE BY MOUTH DAILY 30 MINS PRIOR TO FIRST MEAL AS NEEDED  1  . triamcinolone (NASACORT) 55 MCG/ACT AERO nasal inhaler Place into the nose.     No current facility-administered medications for this visit.    PHYSICAL EXAMINATION: ECOG PERFORMANCE STATUS: 1 - Symptomatic but completely ambulatory  Filed Vitals:   03/08/16 1055  BP: 122/74  Pulse: 86  Temp: 98.4 F (36.9 C)  Resp: 18   Filed Weights   03/08/16 1055  Weight: 119 lb 1.6 oz (54.023 kg)    GENERAL:alert, no distress and comfortable SKIN: skin color, texture, turgor are normal, no rashes or significant lesions EYES: normal, Conjunctiva are pink and non-injected, sclera clear OROPHARYNX:no exudate, no erythema and lips, buccal mucosa, and tongue normal  NECK: supple, thyroid normal size, non-tender, without nodularity LYMPH:  no palpable lymphadenopathy in the cervical, axillary or inguinal LUNGS: clear to auscultation and percussion with normal breathing effort HEART: regular rate & rhythm and no murmurs and no lower extremity edema ABDOMEN:abdomen soft,  non-tender and normal bowel sounds MUSCULOSKELETAL:no cyanosis of digits and no clubbing  NEURO: alert & oriented x 3 with fluent speech, no focal motor/sensory deficits EXTREMITIES: No lower extremity edema  LABORATORY DATA:  I have reviewed the data as  listed   Chemistry      Component Value Date/Time   NA 140 01/19/2016 1227   K 3.7 01/19/2016 1227   CO2 23 01/19/2016 1227   BUN 15.1 01/19/2016 1227   CREATININE 0.9 01/19/2016 1227      Component Value Date/Time   CALCIUM 9.2 01/19/2016 1227   ALKPHOS 55 01/19/2016 1227   AST 13 01/19/2016 1227   ALT 12 01/19/2016 1227   BILITOT 0.36 01/19/2016 1227       Lab Results  Component Value Date   WBC 8.4 01/19/2016   HGB 14.7 01/19/2016   HCT 44.9 01/19/2016   MCV 94.5 01/19/2016   PLT 271 01/19/2016   NEUTROABS 6.2 01/19/2016     ASSESSMENT & PLAN:  Breast cancer of upper-outer quadrant of right female breast ([place]) Right lumpectomy 03/01/2016: IDC grade 1, 0.7 cm, margins negative, 0/4 lymph nodes negative, ER 100%, PR 100%, Ki-67 10%, HER-2 negative ratio 1.32, T1 BN 0 stage IA  Pathology counseling: I discussed the final pathology report of the patient provided  a copy of this report. I discussed the margins as well as lymph node surgeries. We also discussed the final staging along with previously performed ER/PR and HER-2/neu testing.  Treatment plan: 1. Oncotype DX testing to determine if chemotherapy would be of any benefit followed by 2. Adjuvant radiation therapy followed by 3. Adjuvant antiestrogen therapy  Return to clinic based on Oncotype DX score result    No orders of the defined types were placed in this encounter.   The patient has a good understanding of the overall plan. she agrees with it. she will call with any problems that may develop before the next visit here.   Rulon Eisenmenger, MD 03/08/2016

## 2016-03-08 NOTE — Assessment & Plan Note (Signed)
Right lumpectomy 03/01/2016: IDC grade 1, 0.7 cm, margins negative, 0/4 lymph nodes negative, ER 100%, PR 100%, Ki-67 10%, HER-2 negative ratio 1.32, T1 BN 0 stage IA  Pathology counseling: I discussed the final pathology report of the patient provided  a copy of this report. I discussed the margins as well as lymph node surgeries. We also discussed the final staging along with previously performed ER/PR and HER-2/neu testing.  Treatment plan: 1. Oncotype DX testing to determine if chemotherapy would be of any benefit followed by 2. Adjuvant radiation therapy followed by 3. Adjuvant antiestrogen therapy  Return to clinic based on Oncotype DX score result

## 2016-03-13 ENCOUNTER — Other Ambulatory Visit: Payer: Self-pay | Admitting: Surgery

## 2016-03-13 DIAGNOSIS — N644 Mastodynia: Secondary | ICD-10-CM

## 2016-03-14 ENCOUNTER — Encounter: Payer: Self-pay | Admitting: Hematology and Oncology

## 2016-03-14 NOTE — Progress Notes (Signed)
left for dr. Lindi Adie to sign- faxed unum  [phone] and fmla [phone] and mailed copies to patient and sent to medical records

## 2016-03-27 ENCOUNTER — Encounter (HOSPITAL_COMMUNITY): Payer: Self-pay

## 2016-03-27 ENCOUNTER — Telehealth: Payer: Self-pay | Admitting: *Deleted

## 2016-03-27 NOTE — Telephone Encounter (Signed)
Received Oncotype score of 8/6% Gave pt result and informed per Dr. Lindi Adie no chemo needed. Discussed she will receive a call from Dr. Ida Rogue office with a f/u appt to discuss xrt. Denies further needs or questions at this time.

## 2016-04-07 ENCOUNTER — Telehealth: Payer: Self-pay | Admitting: *Deleted

## 2016-04-07 NOTE — Telephone Encounter (Signed)
Pt called with c/o breast pain after sx. Informed pt that was normal breast healing after sx. Confirmed future appts.

## 2016-04-10 ENCOUNTER — Telehealth: Payer: Self-pay | Admitting: Internal Medicine

## 2016-04-11 NOTE — Telephone Encounter (Signed)
Spoke with patient and she reports indigestion since last week. Reports recent surgery and has been taking Advil for pain. Also recent diagnosis of breast cancer. She takes Prilosec and will increase to BID until OV. Scheduled with Ellouise Newer on 04/13/16 at 10:30 AM.

## 2016-04-11 NOTE — Telephone Encounter (Signed)
Pt is calling back because she did not get a return call yesterday

## 2016-04-12 NOTE — Progress Notes (Signed)
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 - LYMPHOVASCULAR INVASION IS IDENTIFIED.  Receptor Status: ER(100%+), PR (100%+), Her2-neu (neg ratio 1.23 then 1.32), Ki-(10%)  Did patient present with symptoms (if so, please note symptoms) or was this found on screening mammography?: Routine screening  Past/Anticipated interventions by surgeon, if LYY:[id number] 6/19/217: Dr. Rolm Bookbinder, MD 1. Breast, lumpectomy, Right - INVASIVE GRADE I DUCTAL CARCINOMA, SPANNING 0.7 CM IN GREATEST DIMENSION.- MARGINS ARE NEGATIVE.- SEE ONCOLOGY TEMPLATE. 2. Soft tissue, lipoma, Right thigh - MATURE ADIPOSE TISSUE CONSISTENT WITH LIPOMA.- NO ATYPIA OR MALIGNANCY IDENTIFIED. 3. Lymph node, sentinel, biopsy, Right axillary #1 1 of 4 FINAL for [name], [name] ([id number]) Diagnosis(continued) - ONE BENIGN LYMPH NODE WITH NO TUMOR SEEN (0/1). 4. Lymph node, sentinel, biopsy, Right axillary #2 - ONE BENIGN LYMPH NODE WITH NO TUMOR SEEN (0/1). 5. Lymph node, sentinel, biopsy, Right axillary #3 - TWO BENIGN LYMPH NODES WITH NO TUMOR SEEN (0/2). Microscopic Comment 1. BREAST, INVASIVE TUMOR, WITH LYMPH NODES PRESENT   Past/Anticipated interventions by medical oncology, if any: Chemotherapy : Dr. Lindi Adie, [name], Oncotype=8/6% no chemotherapy after radiation ,adjuvant antiestrogen therapy  Lymphedema issues, if any:   Pain issues, if any:  SAFETY ISSUES:  Prior radiation? NO  Pacemaker/ICD? NO  Possible current pregnancy?  Is the patient on methotrexate? NO  Current Complaints / other details:  Menarche age 4, G35P0, no HRT,did use birth control pills, Anxiety,Cervical dyspalsia,No tobacco products ever,1 drink alcohol daily, Paternal grandfather-colon cancer, Father prostate cancer    Rebecca Eaton, RN 04/12/2016,12:29 PM

## 2016-04-13 ENCOUNTER — Ambulatory Visit (INDEPENDENT_AMBULATORY_CARE_PROVIDER_SITE_OTHER): Payer: Managed Care, Other (non HMO) | Admitting: Physician Assistant

## 2016-04-13 ENCOUNTER — Encounter: Payer: Self-pay | Admitting: Physician Assistant

## 2016-04-13 VITALS — BP 118/74 | Ht 62.0 in | Wt 118.6 lb

## 2016-04-13 DIAGNOSIS — K219 Gastro-esophageal reflux disease without esophagitis: Secondary | ICD-10-CM

## 2016-04-13 DIAGNOSIS — K589 Irritable bowel syndrome without diarrhea: Secondary | ICD-10-CM

## 2016-04-13 DIAGNOSIS — R1013 Epigastric pain: Secondary | ICD-10-CM | POA: Diagnosis not present

## 2016-04-13 NOTE — Patient Instructions (Addendum)
Continue the Prilosec 20 mg, take 1 tablet twice daily for one month.  We have provided you with Reflux information.

## 2016-04-13 NOTE — Progress Notes (Addendum)
Chief Complaint: "Indigestion"  HPI: [name] is a 40 y/o female, who regularly follows with Dr. Hilarie Mcclure, with a past medical history significant for anxiety, breast cancer, IBS and reflux, who presents to clinic today with a complaint of indigestion. Per chart review the patient recently called our clinic on 04/11/16 with a complaint of indigestion since last week, patient did explain that she was taking Advil for pain and had a recent diagnosis of breast cancer. She was currently on Prilosec, but was told to increase to twice daily dosing until time of office visit today.   Today, the patient explains that she has been using Advil twice a day, for the past week or so for pain related to a recent breast hematoma after lumpectomy. Then, last week on Tuesday morning, 04/04/16 she awoke and took a sip of water which caused a "fire" in her throat and stomach, the whole day this was terrible, she started taking some Gaviscon and avoided her Advil and this was somewhat better Tuesday through Thursday of last week, patient then took more Advil on Friday and the epigastric discomfort and "burning" came back. Saturday she drank a couple of beers and had some wine and felt terrible on Sunday through Tuesday.   Today the patient does feel better. She was told to increase her Prilosec to 20 mg twice a day, she has done this for 2 days now and has some relief. She has continued when necessary Gaviscon which also seems to be helping her symptoms. She has also avoided Advil and alcohol as well as spicy foods over the past few days.  Patient also notes that a "sore developed" on the roof of her mouth over this time period, swishing Gaviscon seems to have been helping the pain related to this sore and helping it to "heal".  Patient denies fever, chills, nausea, vomiting, continued abdominal pain, blood in her stool or melenic appearing stools.   Past Medical History:  Diagnosis Date  . Anxiety   . Breast  cancer ([place])   . Cervical dysplasia   . Gastrointestinal disorder   . GERD (gastroesophageal reflux disease)   . IBS (irritable bowel syndrome)   . Ovarian cyst     Past Surgical History:  Procedure Laterality Date  . arm surgery     left  . BREAST LUMPECTOMY WITH RADIOACTIVE SEED AND SENTINEL LYMPH NODE BIOPSY Right 03/01/2016   Procedure: RIGHT BREAST LUMPECTOMY WITH RADIOACTIVE SEED AND SENTINEL LYMPH NODE BIOPSY;  Surgeon: Stark Klein, MD;  Location: [facility];  Service: General;  Laterality: Right;  . CRYOTHERAPY    . EXCISION MASS LOWER EXTREMETIES Right 03/01/2016   Procedure: EXCISION MASS RIGHT THIGH;  Surgeon: Stark Klein, MD;  Location: [facility];  Service: General;  Laterality: Right;  . MOUTH SURGERY     "gum surgery" x 2  . TONSILLECTOMY    . WISDOM TOOTH EXTRACTION      Current Outpatient Prescriptions  Medication Sig Dispense Refill  . ALPRAZolam (XANAX) 0.5 MG tablet Take 0.5 mg by mouth at bedtime as needed for anxiety.    . chlordiazePOXIDE (LIBRIUM) 10 MG capsule Take 5 mg by mouth daily.     Marland Kitchen loratadine (ALAVERT) 10 MG tablet Take 10 mg by mouth daily as needed.    Marland Kitchen omeprazole (PRILOSEC) 20 MG capsule TAKE 1 CAPUSLE BY MOUTH DAILY 30 MINS PRIOR TO FIRST MEAL AS NEEDED  1  . triamcinolone (NASACORT) 55 MCG/ACT AERO nasal inhaler  Place into the nose.     No current facility-administered medications for this visit.     Allergies as of 04/13/2016  . (No Known Allergies)    Family History  Problem Relation Age of Onset  . Hyperlipidemia Father   . Prostate cancer Father   . Hypertension Father   . Hypertension Mother   . Hyperlipidemia Mother   . Colon cancer Paternal Grandfather     Social History   Social History  . Marital status: Unknown    Spouse name: N/A  . Number of children: N/A  . Years of education: N/A   Occupational History  . Not on file.   Social History Main Topics  . Smoking status: Never  Smoker  . Smokeless tobacco: Never Used  . Alcohol use Yes     Comment: 0-1 drinks daily  . Drug use: No  . Sexual activity: Not on file   Other Topics Concern  . Not on file   Social History Narrative  . No narrative on file    Review of Systems:    Constitutional: No weight loss, Fever or chills HEENT: Eyes: No change in vision               Ears, Nose, Throat:  No change in hearing Skin: No rash or itching Cardiovascular: No chest pain, chest pressure or palpitations     Respiratory: No SOB or cough Gastrointestinal: See HPI and otherwise negative Genitourinary: No dysuria or change in urinary frequency Neurological: No headache or dizziness Musculoskeletal: No new muscle or back pain Hematologic: No anemia or bleeding Psychiatric: No history of depression or anxiety   Physical Exam:  Vital signs: BP 118/74 (BP Location: Left Arm, Patient Position: Sitting, Cuff Size: Normal)   Ht 5\' 2"  (1.575 m)   Wt 118 lb 9.6 oz (53.8 kg)   LMP 03/28/2016 Comment: Onset of menses 7-11  BMI 21.69 kg/m  General:   Pleasant Caucasian female appears to be in NAD, Well developed, Well nourished, alert and cooperative Head:  Normocephalic and atraumatic. Eyes:   PEERL, EOMI. No icterus. Conjunctiva pink. Ears:  Normal auditory acuity. Neck:  Supple Throat: Erythema of hard palate, with what appears to be an aphthous ulcer; Oral cavity and pharynx without inflammation, swelling or lesion.  Lungs: Respirations even and unlabored. Lungs clear to auscultation bilaterally.   No wheezes, crackles, or rhonchi.  Heart: Normal S1, S2. No MRG. Regular rate and rhythm. No peripheral edema, cyanosis or pallor.  Abdomen:  Soft, generalized TTP, nontender. No rebound or guarding. Normal bowel sounds. No appreciable masses or hepatomegaly. Rectal:  Not performed.  Msk:  Symmetrical without gross deformities. Peripheral pulses intact.  Extremities:  Without edema, no deformity or joint abnormality.    Neurologic:  Alert and  oriented x4;  grossly normal neurologically.  Skin:   Dry and intact without significant lesions or rashes. Psychiatric: Oriented to person, place and time. Demonstrates good judgement and reason without abnormal affect or behaviors.  RELEVANT LABS AND IMAGING: CBC    Component Value Date/Time   WBC 8.4 01/19/2016 1227   WBC 7.1 10/11/2015 1059   RBC 4.75 01/19/2016 1227   RBC 4.89 10/11/2015 1059   HGB 14.7 01/19/2016 1227   HCT 44.9 01/19/2016 1227   PLT 271 01/19/2016 1227   MCV 94.5 01/19/2016 1227   MCH 31.0 01/19/2016 1227   MCHC 32.8 01/19/2016 1227   MCHC 33.5 10/11/2015 1059   RDW 12.7 01/19/2016 1227  LYMPHSABS 1.5 01/19/2016 1227   MONOABS 0.5 01/19/2016 1227   EOSABS 0.1 01/19/2016 1227   BASOSABS 0.1 01/19/2016 1227    CMP     Component Value Date/Time   NA 140 01/19/2016 1227   K 3.7 01/19/2016 1227   CO2 23 01/19/2016 1227   GLUCOSE 99 01/19/2016 1227   BUN 15.1 01/19/2016 1227   CREATININE 0.9 01/19/2016 1227   CALCIUM 9.2 01/19/2016 1227   PROT 6.6 01/19/2016 1227   ALBUMIN 3.7 01/19/2016 1227   AST 13 01/19/2016 1227   ALT 12 01/19/2016 1227   ALKPHOS 55 01/19/2016 1227   BILITOT 0.36 01/19/2016 1227    Assessment: 1. GERD:Increase in reflux symptoms over the past week after starting Advil twice a day for pain related to breast hematoma after recent lumpectomy, this was "terrible on Tuesday morning", 04/11/16 and continued until 2 days ago when the patient increased her Prilosec to 20 mg twice a day, after 2 days of this increased dose the patient now feels relief; likely this represents acute gastritis related to recent NSAID use, cannot rule out PUD, though the symptoms have decreased with twice a day PPI use, if increase in symptoms in the future could consider EGD and/or increasing Prilosec to 40 mg twice a day 2. Epigastric pain: related to above 3. IBS-D: controlled  Plan: 1. Continue Prilosec 20 mg twice a day, 30-60  minutes before eating for the next 3-4 weeks. At that time back down to your regular dose of Prilosec 20 mg once daily. 2. May continue to use Gaviscon when necessary and/or Zantac for breakthrough symptoms 3. If symptoms increase or worsen in the next 1-2 weeks, would recommend an increase in Prilosec to 40 mg twice a day and possibly an EGD for further evaluation 4. Reviewed antireflux diet and lifestyle modifications with the patient today. Provided her with a handout. Also recommend she discontinue use of NSAIDs and try heating pads and/or Tylenol for pain related to her hematoma. 5. Patient will follow in clinic as needed in the future  Ellouise Newer, Hershal Coria [facility] 04/13/2016, 10:48 AM  Cc: Kathyrn Lass, MD   Addendum: Reviewed and agree with initial management. Jerene Bears, MD

## 2016-04-14 ENCOUNTER — Telehealth: Payer: Self-pay | Admitting: *Deleted

## 2016-04-14 NOTE — Telephone Encounter (Signed)
  Oncology Nurse Navigator Documentation    Navigator Encounter Type: Telephone (04/14/16 0900) Telephone: Lahoma Crocker Call;Appt Confirmation/Clarification (04/14/16 0900)  Discussed consultation with Dr. [name] on 04/17/16                                      Time Spent with Patient: 15 (04/14/16 0900)

## 2016-04-17 ENCOUNTER — Ambulatory Visit
Admission: RE | Admit: 2016-04-17 | Discharge: 2016-04-17 | Disposition: A | Discharge status: Home / Self Care | Payer: Managed Care, Other (non HMO) | Source: Ambulatory Visit | Attending: Radiation Oncology | Admitting: Radiation Oncology

## 2016-04-17 ENCOUNTER — Ambulatory Visit: Payer: Managed Care, Other (non HMO)

## 2016-04-17 DIAGNOSIS — C50411 Malignant neoplasm of upper-outer quadrant of right female breast: Secondary | ICD-10-CM | POA: Insufficient documentation

## 2016-04-17 DIAGNOSIS — L309 Dermatitis, unspecified: Secondary | ICD-10-CM | POA: Insufficient documentation

## 2016-04-21 ENCOUNTER — Encounter: Payer: Self-pay | Admitting: Radiation Oncology

## 2016-04-21 ENCOUNTER — Ambulatory Visit
Admission: RE | Admit: 2016-04-21 | Discharge: 2016-04-21 | Disposition: A | Discharge status: Home / Self Care | Payer: Managed Care, Other (non HMO) | Source: Ambulatory Visit | Attending: Radiation Oncology | Admitting: Radiation Oncology

## 2016-04-21 VITALS — BP 120/82 | HR 60 | Temp 98.6°F | Resp 16 | Ht 62.0 in | Wt 122.6 lb

## 2016-04-21 DIAGNOSIS — L309 Dermatitis, unspecified: Secondary | ICD-10-CM | POA: Diagnosis present

## 2016-04-21 DIAGNOSIS — C50411 Malignant neoplasm of upper-outer quadrant of right female breast: Secondary | ICD-10-CM

## 2016-04-21 NOTE — Progress Notes (Signed)
Paperwork (FMLA) received from nurse 8/4, given back to nurse 8/7

## 2016-04-21 NOTE — Progress Notes (Addendum)
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 - LYMPHOVASCULAR INVASION IS IDENTIFIED.  Receptor Status: ER(100%+), PR (100%+), Her2-neu (neg ratio 1.23 then 1.32), Ki-(10%)  Did patient present with symptoms (if so, please note symptoms) or was this found on screening mammography?: Routine screening  Past/Anticipated interventions by surgeon, if JFH:[id number] 6/19/217: Dr. Rolm Bookbinder, MD 1. Breast, lumpectomy, Right - INVASIVE GRADE I DUCTAL CARCINOMA, SPANNING 0.7 CM IN GREATEST DIMENSION.- MARGINS ARE NEGATIVE.- SEE ONCOLOGY TEMPLATE. 2. Soft tissue, lipoma, Right thigh - MATURE ADIPOSE TISSUE CONSISTENT WITH LIPOMA.- NO ATYPIA OR MALIGNANCY IDENTIFIED. 3. Lymph node, sentinel, biopsy, Right axillary #1 1 of 4 FINAL for [facility], [place] ([id number]) Diagnosis(continued) - ONE BENIGN LYMPH NODE WITH NO TUMOR SEEN (0/1). 4. Lymph node, sentinel, biopsy, Right axillary #2 - ONE BENIGN LYMPH NODE WITH NO TUMOR SEEN (0/1). 5. Lymph node, sentinel, biopsy, Right axillary #3 - TWO BENIGN LYMPH NODES WITH NO TUMOR SEEN (0/2). Microscopic Comment 1. BREAST, INVASIVE TUMOR, WITH LYMPH NODES PRESENT   Past/Anticipated interventions by medical oncology, if any: Chemotherapy : Dr. Lindi Adie, [name], Oncotype=8/6% no chemotherapy after radiation ,adjuvant antiestrogen therapy  Lymphedema issues, if any:  no Pain issues, if any:yes under right arm hematoma still   SAFETY ISSUES:  Prior radiation? NO  Pacemaker/ICD? NO  Possible current pregnancy? NO  Is the patient on methotrexate? NO  Current Complaints / other details:  Divorced ,  Menarche age 12, G64P0, no HRT,did use birth control pills, Anxiety,Cervical dyspalsia,No tobacco products ever,1 drink alcohol daily, Paternal grandfather-colon cancer, Father prostate cancer    Allergies: Seasonal  Rebecca Eaton, RN 04/21/2016,12:33 PM  BP 120/82 (BP Location: Left Arm, Patient Position: Sitting, Cuff Size: Normal)   Pulse 60   Temp 98.6 F (37 C) (Oral)   Resp 16   Ht '5\' 2"'  (1.575 m)   Wt 122 lb 9.6 oz (55.6 kg)   LMP 04/21/2016 (Within Days) Comment: Onset of menses 7-11  BMI 22.42 kg/m   Wt Readings from Last 3 Encounters:  04/21/16 122 lb 9.6 oz (55.6 kg)  04/13/16 118 lb 9.6 oz (53.8 kg)  03/08/16 119 lb 1.6 oz (54 kg)

## 2016-04-21 NOTE — Progress Notes (Signed)
Please see the Nurse Progress Note in the MD Initial Consult Encounter for this patient. 

## 2016-04-21 NOTE — Progress Notes (Signed)
Radiation Oncology         [phone] ________________________________  Name: Allison Mcclure MRN: [id number]  Date: 04/21/2016  DOB: 1976-04-29  Follow-Up Visit Note  CC: Tawanna Solo, MD  Stark Klein, MD  Diagnosis:    Breast cancer of upper-outer quadrant of right female breast [facility])   Staging form: Breast, AJCC 7th Edition   - Clinical stage from 01/19/2016: Stage IA (T1b, N0, M0) - Unsigned         Staging comments: Staged at breast conference on 5.3.17  Narrative:  The patient returns today for follow-up.  She has completed lumpectomy and now presents for further discussion and coordination of an anticipated course of radiation treatment. The patient has seen medical oncology. She will not receive chemotherapy as the patient's Oncotype score returned in the low risk category. The patient will be receiving anti-hormonal treatment after radiation treatment.  The patient states that she is has done adequately since surgery. She has had some difficulties in terms of recovering due to a hematoma. There was an attempt to draw all fluid but much of this had hardened. She states that this has been slowly improving. She is scheduled to be seen by surgery next week.              ALLERGIES:  has No Known Allergies.  Meds: Current Outpatient Prescriptions  Medication Sig Dispense Refill  . chlordiazePOXIDE (LIBRIUM) 10 MG capsule Take 5 mg by mouth daily.     . Lactobacillus (PROBIOTIC ACIDOPHILUS) TABS Take 1 tablet by mouth daily as needed.    Marland Kitchen omeprazole (PRILOSEC) 20 MG capsule TAKE 1 CAPUSLE BY MOUTH DAILY 30 MINS PRIOR TO FIRST MEAL AS NEEDED  1  . triamcinolone (NASACORT) 55 MCG/ACT AERO nasal inhaler Place into the nose.    Marland Kitchen ALPRAZolam (XANAX) 0.5 MG tablet Take 0.5 mg by mouth at bedtime as needed for anxiety.    Marland Kitchen loratadine (ALAVERT) 10 MG tablet Take 10 mg by mouth daily as needed.     No current facility-administered medications for this encounter.     Physical  Findings: The patient is in no acute distress. Patient is alert and oriented.  height is 5\' 2"  (1.575 m) and weight is 122 lb 9.6 oz (55.6 kg). Her oral temperature is 98.6 F (37 C). Her blood pressure is 120/82 and her pulse is 60. Her respiration is 16. .   The patient's incision is very well-healed. No sign of infection. Underlying seroma present. This area is somewhat tender.  Lab Findings: Lab Results  Component Value Date   WBC 8.4 01/19/2016   HGB 14.7 01/19/2016   HCT 44.9 01/19/2016   MCV 94.5 01/19/2016   PLT 271 01/19/2016     Radiographic Findings: No results found.  Impression:     Breast cancer of upper-outer quadrant of right female breast [facility])   Staging form: Breast, AJCC 7th Edition   - Clinical stage from 01/19/2016: Stage IA (T1b, N0, M0) - Unsigned         Staging comments: Staged at breast conference on 5.3.17  The patient has completed surgery. She is suitable to proceed with adjuvant radiotherapy at this time.  I once again discussed the rationale for radiation treatment in this setting. We discussed the potential benefit of radiation treatment in terms of local/regional control. We also discussed the possible side effects and risks of a possible course of radiation treatment. All of her questions were answered.  The patient does wish  to proceed with radiation treatment.  Plan:  The patient will be scheduled for a simulation in the near future such that we can proceed with radiation treatment planning. I anticipate treating the patientfor approximately 6-1/2 weeks to the right breast using tangent fields.  ------------------------------------------------  Jodelle Gross, MD, PhD

## 2016-04-21 NOTE — Addendum Note (Signed)
Encounter addended by: Kyung Rudd, MD on: 04/21/2016  2:30 PM<BR>    Actions taken: LOS modified, Problem List modified, Visit diagnoses modified, Sign clinical note, Follow-up modified

## 2016-04-21 NOTE — Addendum Note (Signed)
Encounter addended by: Doreen Beam, RN on: 04/21/2016 12:46 PM<BR>    Actions taken: Charge Capture section accepted

## 2016-04-24 ENCOUNTER — Ambulatory Visit
Admission: RE | Admit: 2016-04-24 | Discharge: 2016-04-24 | Disposition: A | Discharge status: Home / Self Care | Payer: Managed Care, Other (non HMO) | Source: Ambulatory Visit | Attending: Radiation Oncology | Admitting: Radiation Oncology

## 2016-04-24 DIAGNOSIS — C50411 Malignant neoplasm of upper-outer quadrant of right female breast: Secondary | ICD-10-CM

## 2016-04-25 NOTE — Progress Notes (Signed)
  Radiation Oncology         [phone]) [phone] ________________________________  Name: Allison Mcclure MRN: [id number]  Date: 04/24/2016  DOB: Jul 13, 1976  DIAGNOSIS:     ICD-9-CM ICD-10-CM   1. Breast cancer of upper-outer quadrant of right female breast ([place]) 174.4 C50.411      SIMULATION AND TREATMENT PLANNING NOTE  The patient presented for simulation prior to beginning her course of radiation treatment for her diagnosis of right-sided breast cancer. The patient was placed in a supine position on a breast board. A customized vac-lock bag was constructed and this complex treatment device will be used on a daily basis during her treatment. In this fashion, a CT scan was obtained through the chest area and an isocenter was placed near the chest wall within the breast.  The patient will be planned to receive a course of radiation initially to a dose of 50.4 Gy. This will consist of a whole breast radiotherapy technique. To accomplish this, 2 customized blocks have been designed which will correspond to medial and lateral whole breast tangent fields. This treatment will be accomplished at 1.8 Gy per fraction. A forward planning technique will also be evaluated to determine if this approach improves the plan. It is anticipated that the patient will then receive a 10 Gy boost to the seroma cavity which has been contoured. This will be accomplished at 2 Gy per fraction.   This initial treatment will consist of a 3-D conformal technique. The seroma has been contoured as the primary target structure. Additionally, dose volume histograms of both this target as well as the lungs and heart will also be evaluated. Such an approach is necessary to ensure that the target area is adequately covered while the nearby critical  normal structures are adequately spared.  Plan:  The final anticipated total dose therefore will correspond to 60.4 Gy.    _______________________________   Jodelle Gross, MD, PhD

## 2016-04-25 NOTE — Progress Notes (Signed)
  Radiation Oncology         [phone] ________________________________  Name: Allison Mcclure MRN: [id number]  Date: 04/24/2016  DOB: [birth year]  Optical Surface Tracking Plan:  Since intensity modulated radiotherapy (IMRT) and 3D conformal radiation treatment methods are predicated on accurate and precise positioning for treatment, intrafraction motion monitoring is medically necessary to ensure accurate and safe treatment delivery.  The ability to quantify intrafraction motion without excessive ionizing radiation dose can only be performed with optical surface tracking. Accordingly, surface imaging offers the opportunity to obtain 3D measurements of patient position throughout IMRT and 3D treatments without excessive radiation exposure.  I am ordering optical surface tracking for this patient's upcoming course of radiotherapy. ________________________________  Kyung Rudd, MD 04/25/2016 7:33 PM    Reference:   Particia Jasper, et al. Surface imaging-based analysis of intrafraction motion for breast radiotherapy patients.Journal of [place], n. 6, nov. 2014. ISSN [id number].   Available at: <http://www.jacmp.org/index.php/jacmp/article/view/4957>.

## 2016-04-27 DIAGNOSIS — C50411 Malignant neoplasm of upper-outer quadrant of right female breast: Secondary | ICD-10-CM | POA: Diagnosis not present

## 2016-04-28 ENCOUNTER — Telehealth: Payer: Self-pay | Admitting: Radiation Oncology

## 2016-04-28 NOTE — Telephone Encounter (Signed)
I spoke with the patient about FMLA and her concerns about radiotherapy. We reviewed that we would hold off on filling out FMLA forms since Dr. Lindi Adie has filled this out for her.

## 2016-05-01 ENCOUNTER — Ambulatory Visit
Admission: RE | Admit: 2016-05-01 | Discharge: 2016-05-01 | Disposition: A | Discharge status: Home / Self Care | Payer: Managed Care, Other (non HMO) | Source: Ambulatory Visit | Attending: Radiation Oncology | Admitting: Radiation Oncology

## 2016-05-01 ENCOUNTER — Encounter: Payer: Self-pay | Admitting: Radiation Oncology

## 2016-05-01 DIAGNOSIS — C50411 Malignant neoplasm of upper-outer quadrant of right female breast: Secondary | ICD-10-CM | POA: Diagnosis not present

## 2016-05-02 ENCOUNTER — Ambulatory Visit
Admission: RE | Admit: 2016-05-02 | Discharge: 2016-05-02 | Disposition: A | Discharge status: Home / Self Care | Payer: Managed Care, Other (non HMO) | Source: Ambulatory Visit | Attending: Radiation Oncology | Admitting: Radiation Oncology

## 2016-05-02 VITALS — Wt 125.0 lb

## 2016-05-02 DIAGNOSIS — C50411 Malignant neoplasm of upper-outer quadrant of right female breast: Secondary | ICD-10-CM | POA: Diagnosis not present

## 2016-05-02 MED ORDER — RADIAPLEXRX EX GEL
Freq: Once | CUTANEOUS | Status: AC
  Administered 2016-05-02: 10:00:00 via TOPICAL

## 2016-05-02 NOTE — Addendum Note (Signed)
Encounter addended by: Doreen Beam, RN on: 05/02/2016  9:51 AM<BR>    Actions taken: Vitals modified

## 2016-05-02 NOTE — Progress Notes (Signed)
Patient education done, Radiation  therapy and you book ,my business card, radiaplex only, she has deodorant that doesn't have any aluminum  In it, discussed ways to manage side effects, skin irritation ,pain swelling/soreness breast, fatigue, to use electric razor only, increase protein in diet, stay hydrated,drink plenty water, verbal understanding, teach back given 9:48 AM

## 2016-05-03 ENCOUNTER — Ambulatory Visit
Admission: RE | Admit: 2016-05-03 | Discharge: 2016-05-03 | Disposition: A | Discharge status: Home / Self Care | Payer: Managed Care, Other (non HMO) | Source: Ambulatory Visit | Attending: Radiation Oncology | Admitting: Radiation Oncology

## 2016-05-03 DIAGNOSIS — C50411 Malignant neoplasm of upper-outer quadrant of right female breast: Secondary | ICD-10-CM | POA: Diagnosis not present

## 2016-05-04 ENCOUNTER — Ambulatory Visit
Admission: RE | Admit: 2016-05-04 | Discharge: 2016-05-04 | Disposition: A | Discharge status: Home / Self Care | Payer: Managed Care, Other (non HMO) | Source: Ambulatory Visit | Attending: Radiation Oncology | Admitting: Radiation Oncology

## 2016-05-04 DIAGNOSIS — C50411 Malignant neoplasm of upper-outer quadrant of right female breast: Secondary | ICD-10-CM | POA: Diagnosis not present

## 2016-05-05 ENCOUNTER — Ambulatory Visit
Admission: RE | Admit: 2016-05-05 | Discharge: 2016-05-05 | Disposition: A | Discharge status: Home / Self Care | Payer: Managed Care, Other (non HMO) | Source: Ambulatory Visit | Attending: Radiation Oncology | Admitting: Radiation Oncology

## 2016-05-05 ENCOUNTER — Encounter: Payer: Self-pay | Admitting: Radiation Oncology

## 2016-05-05 VITALS — BP 111/56 | HR 78 | Temp 98.6°F | Resp 16 | Ht 62.0 in | Wt 118.8 lb

## 2016-05-05 DIAGNOSIS — C50411 Malignant neoplasm of upper-outer quadrant of right female breast: Secondary | ICD-10-CM

## 2016-05-05 NOTE — Progress Notes (Signed)
   Department of Radiation Oncology  Phone:  [phone] Fax:        [phone]  Weekly Treatment Note    Name: Allison Mcclure Date: 05/05/2016 MRN: [id number] DOB: Feb 27, 1976   Diagnosis:     ICD-9-CM ICD-10-CM   1. Breast cancer of upper-outer quadrant of right female breast ([place]) 174.4 C50.411      Current dose: 7.2 Gy  Current fraction:4   MEDICATIONS: Current Outpatient Prescriptions  Medication Sig Dispense Refill  . ALPRAZolam (XANAX) 0.5 MG tablet Take 0.5 mg by mouth at bedtime as needed for anxiety.    . chlordiazePOXIDE (LIBRIUM) 10 MG capsule Take 5 mg by mouth daily.     . hyaluronate sodium (RADIAPLEXRX) GEL Apply 1 application topically 2 (two) times daily. Apply to skin daily after rad tx and bedtime    . ibuprofen (ADVIL,MOTRIN) 200 MG tablet Take 200 mg by mouth every 8 (eight) hours as needed.    . Lactobacillus (PROBIOTIC ACIDOPHILUS) TABS Take 1 tablet by mouth daily as needed.    Marland Kitchen omeprazole (PRILOSEC) 20 MG capsule TAKE 1 CAPUSLE BY MOUTH DAILY 30 MINS PRIOR TO FIRST MEAL AS NEEDED  1  . loratadine (ALAVERT) 10 MG tablet Take 10 mg by mouth daily as needed.    . triamcinolone (NASACORT) 55 MCG/ACT AERO nasal inhaler Place into the nose.     No current facility-administered medications for this encounter.      ALLERGIES: Review of patient's allergies indicates no known allergies.   LABORATORY DATA:  Lab Results  Component Value Date   WBC 8.4 01/19/2016   HGB 14.7 01/19/2016   HCT 44.9 01/19/2016   MCV 94.5 01/19/2016   PLT 271 01/19/2016   Lab Results  Component Value Date   NA 140 01/19/2016   K 3.7 01/19/2016   CO2 23 01/19/2016   Lab Results  Component Value Date   ALT 12 01/19/2016   AST 13 01/19/2016   ALKPHOS 55 01/19/2016   BILITOT 0.36 01/19/2016     NARRATIVE: Allison Mcclure was seen today for weekly treatment management. The chart was checked and the patient's films were reviewed.  Allison Mcclure hs  received 4 fractions to her right breast,skin with normal had a tan to her upper chest before starting radiation.  Using Radiaplex gel bid.  Appetite is good and denies fatigue.  Pain 3/10 taking Advil. BP (!) 111/56 (BP Location: Left Arm, Patient Position: Sitting, Cuff Size: Normal)   Pulse 78   Temp 98.6 F (37 C) (Oral)   Resp 16   Ht 5\' 2"  (1.575 m)   Wt 118 lb 12.8 oz (53.9 kg)   LMP 04/21/2016 (Within Days) Comment: Onset of menses 7-11  SpO2 100%   BMI 21.73 kg/m   PHYSICAL EXAMINATION: height is 5\' 2"  (1.575 m) and weight is 118 lb 12.8 oz (53.9 kg). Her oral temperature is 98.6 F (37 C). Her blood pressure is 111/56 (abnormal) and her pulse is 78. Her respiration is 16 and oxygen saturation is 100%.        ASSESSMENT: The patient is doing satisfactorily with treatment.  PLAN: We will continue with the patient's radiation treatment as planned.

## 2016-05-05 NOTE — Progress Notes (Signed)
Allison Mcclure hs received 4 fractions to her right breast,skin with normal had a tan to her upper chest before starting radiation.  Using Radiaplex gel bid.  Appetite is good and denies fatigue.  Pain 3/10 taking Advil. BP (!) 111/56 (BP Location: Left Arm, Patient Position: Sitting, Cuff Size: Normal)   Pulse 78   Temp 98.6 F (37 C) (Oral)   Resp 16   Ht 5\' 2"  (1.575 m)   Wt 118 lb 12.8 oz (53.9 kg)   LMP 04/21/2016 (Within Days) Comment: Onset of menses 7-11  SpO2 100%   BMI 21.73 kg/m

## 2016-05-08 ENCOUNTER — Ambulatory Visit
Admission: RE | Admit: 2016-05-08 | Discharge: 2016-05-08 | Disposition: A | Discharge status: Home / Self Care | Payer: Managed Care, Other (non HMO) | Source: Ambulatory Visit | Attending: Radiation Oncology | Admitting: Radiation Oncology

## 2016-05-08 DIAGNOSIS — C50411 Malignant neoplasm of upper-outer quadrant of right female breast: Secondary | ICD-10-CM | POA: Diagnosis not present

## 2016-05-09 ENCOUNTER — Ambulatory Visit
Admission: RE | Admit: 2016-05-09 | Discharge: 2016-05-09 | Disposition: A | Discharge status: Home / Self Care | Payer: Managed Care, Other (non HMO) | Source: Ambulatory Visit | Attending: Radiation Oncology | Admitting: Radiation Oncology

## 2016-05-09 DIAGNOSIS — C50411 Malignant neoplasm of upper-outer quadrant of right female breast: Secondary | ICD-10-CM | POA: Diagnosis not present

## 2016-05-10 ENCOUNTER — Ambulatory Visit
Admission: RE | Admit: 2016-05-10 | Discharge: 2016-05-10 | Disposition: A | Discharge status: Home / Self Care | Payer: Managed Care, Other (non HMO) | Source: Ambulatory Visit | Attending: Radiation Oncology | Admitting: Radiation Oncology

## 2016-05-10 DIAGNOSIS — C50411 Malignant neoplasm of upper-outer quadrant of right female breast: Secondary | ICD-10-CM | POA: Diagnosis not present

## 2016-05-11 ENCOUNTER — Telehealth: Payer: Self-pay | Admitting: *Deleted

## 2016-05-11 ENCOUNTER — Ambulatory Visit
Admission: RE | Admit: 2016-05-11 | Discharge: 2016-05-11 | Disposition: A | Discharge status: Home / Self Care | Payer: Managed Care, Other (non HMO) | Source: Ambulatory Visit | Attending: Radiation Oncology | Admitting: Radiation Oncology

## 2016-05-11 DIAGNOSIS — C50411 Malignant neoplasm of upper-outer quadrant of right female breast: Secondary | ICD-10-CM | POA: Diagnosis not present

## 2016-05-11 NOTE — Telephone Encounter (Signed)
  Oncology Nurse Navigator Documentation    Navigator Encounter Type: Telephone (left vm to assess needs during xrt.) (05/11/16 1500) Telephone: Outgoing Call;Patient Update (Contact information provided.) (05/11/16 1500)         Patient Visit Type: RadOnc (05/11/16 1500) Treatment Phase: First Radiation Tx (05/11/16 1500)                  Acuity: Level 1 (05/11/16 1500)         Time Spent with Patient: 15 (05/11/16 1500)

## 2016-05-12 ENCOUNTER — Ambulatory Visit
Admission: RE | Admit: 2016-05-12 | Discharge: 2016-05-12 | Disposition: A | Discharge status: Home / Self Care | Payer: Managed Care, Other (non HMO) | Source: Ambulatory Visit | Attending: Radiation Oncology | Admitting: Radiation Oncology

## 2016-05-12 ENCOUNTER — Encounter: Payer: Self-pay | Admitting: Radiation Oncology

## 2016-05-12 VITALS — BP 111/80 | HR 75 | Temp 98.2°F | Resp 20 | Wt 122.8 lb

## 2016-05-12 DIAGNOSIS — C50411 Malignant neoplasm of upper-outer quadrant of right female breast: Secondary | ICD-10-CM | POA: Diagnosis not present

## 2016-05-12 NOTE — Progress Notes (Addendum)
Weekly  Right breast, 9/28 completed, mild erythema, seroma hard,  And painful  Stated, took ibuprofen which helped stated,  Didn't sleep well, using rdaiplex bid 9:25 AM  Wt Readings from Last 3 Encounters:  05/05/16 118 lb 12.8 oz (53.9 kg)  05/02/16 125 lb (56.7 kg)  04/21/16 122 lb 9.6 oz (55.6 kg)  BP 111/80 (BP Location: Left Arm, Patient Position: Sitting, Cuff Size: Normal)   Pulse 75   Temp 98.2 F (36.8 C) (Oral)   Resp 20   Wt 122 lb 12.8 oz (55.7 kg)   LMP 04/21/2016 (Within Days) Comment: Onset of menses 7-11  BMI 22.46 kg/m

## 2016-05-12 NOTE — Progress Notes (Signed)
   Department of Radiation Oncology  Phone:  [phone] Fax:        [phone]  Weekly Treatment Note    Name: Allison Mcclure Date: 05/14/2016 MRN: [id number] DOB: 1976/05/27   Diagnosis:     ICD-9-CM ICD-10-CM   1. Breast cancer of upper-outer quadrant of right female breast ([place]) 174.4 C50.411      Current dose: 16.2 Gy  Current fraction: 9   MEDICATIONS: Current Outpatient Prescriptions  Medication Sig Dispense Refill  . ALPRAZolam (XANAX) 0.5 MG tablet Take 0.5 mg by mouth at bedtime as needed for anxiety.    . chlordiazePOXIDE (LIBRIUM) 10 MG capsule Take 5 mg by mouth daily.     . hyaluronate sodium (RADIAPLEXRX) GEL Apply 1 application topically 2 (two) times daily. Apply to skin daily after rad tx and bedtime    . ibuprofen (ADVIL,MOTRIN) 200 MG tablet Take 200 mg by mouth every 8 (eight) hours as needed.    . Lactobacillus (PROBIOTIC ACIDOPHILUS) TABS Take 1 tablet by mouth daily as needed.    . loratadine (ALAVERT) 10 MG tablet Take 10 mg by mouth daily as needed.    Marland Kitchen omeprazole (PRILOSEC) 20 MG capsule TAKE 1 CAPUSLE BY MOUTH DAILY 30 MINS PRIOR TO FIRST MEAL AS NEEDED  1  . triamcinolone (NASACORT) 55 MCG/ACT AERO nasal inhaler Place into the nose.     No current facility-administered medications for this encounter.      ALLERGIES: Review of patient's allergies indicates no known allergies.   LABORATORY DATA:  Lab Results  Component Value Date   WBC 8.4 01/19/2016   HGB 14.7 01/19/2016   HCT 44.9 01/19/2016   MCV 94.5 01/19/2016   PLT 271 01/19/2016   Lab Results  Component Value Date   NA 140 01/19/2016   K 3.7 01/19/2016   CO2 23 01/19/2016   Lab Results  Component Value Date   ALT 12 01/19/2016   AST 13 01/19/2016   ALKPHOS 55 01/19/2016   BILITOT 0.36 01/19/2016     NARRATIVE: Allison Mcclure was seen today for weekly treatment management. The chart was checked and the patient's films were reviewed.  Right breast, 9/28  completed. Mild erythema, hard seroma in the right breast that is painful. She took ibuprofen which helped. She doesn't sleep well and is using rdaiplex bid.  BP 111/80 (BP Location: Left Arm, Patient Position: Sitting, Cuff Size: Normal)   Pulse 75   Temp 98.2 F (36.8 C) (Oral)   Resp 20   Wt 122 lb 12.8 oz (55.7 kg)   LMP 04/21/2016 (Within Days) Comment: Onset of menses 7-11  BMI 22.46 kg/m   PHYSICAL EXAMINATION: weight is 122 lb 12.8 oz (55.7 kg). Her oral temperature is 98.2 F (36.8 C). Her blood pressure is 111/80 and her pulse is 75. Her respiration is 20.   3 cm palpable hematoma lateral aspect of right breast. No desquamation.  ASSESSMENT: The patient is doing satisfactorily with treatment.  PLAN: We will continue with the patient's radiation treatment as planned.   This document serves as a record of services personally performed by Shona Simpson, PA-C and Kyung Rudd, MD. It was created on their behalf by Darcus Austin, a trained medical scribe. The creation of this record is based on the scribe's personal observations and the providers' statements to them. This document has been checked and approved by the attending provider.

## 2016-05-15 ENCOUNTER — Ambulatory Visit
Admission: RE | Admit: 2016-05-15 | Discharge: 2016-05-15 | Disposition: A | Discharge status: Home / Self Care | Payer: Managed Care, Other (non HMO) | Source: Ambulatory Visit | Attending: Radiation Oncology | Admitting: Radiation Oncology

## 2016-05-15 DIAGNOSIS — C50411 Malignant neoplasm of upper-outer quadrant of right female breast: Secondary | ICD-10-CM | POA: Diagnosis not present

## 2016-05-16 ENCOUNTER — Telehealth: Payer: Self-pay | Admitting: *Deleted

## 2016-05-16 ENCOUNTER — Ambulatory Visit
Admission: RE | Admit: 2016-05-16 | Discharge: 2016-05-16 | Disposition: A | Discharge status: Home / Self Care | Payer: Managed Care, Other (non HMO) | Source: Ambulatory Visit | Attending: Radiation Oncology | Admitting: Radiation Oncology

## 2016-05-16 DIAGNOSIS — C50411 Malignant neoplasm of upper-outer quadrant of right female breast: Secondary | ICD-10-CM | POA: Diagnosis not present

## 2016-05-16 NOTE — Telephone Encounter (Signed)
Return pt call- relate she has had constant breast pain since she started radiation. Informed pt that she may have some breast pain and that it wasn't uncommon. Informed pt that I would reach out to Dr.Moody and Dr. Barry Dienes about her concern.

## 2016-05-17 ENCOUNTER — Ambulatory Visit
Admission: RE | Admit: 2016-05-17 | Discharge: 2016-05-17 | Disposition: A | Discharge status: Home / Self Care | Payer: Managed Care, Other (non HMO) | Source: Ambulatory Visit | Attending: Radiation Oncology | Admitting: Radiation Oncology

## 2016-05-17 DIAGNOSIS — C50411 Malignant neoplasm of upper-outer quadrant of right female breast: Secondary | ICD-10-CM | POA: Diagnosis not present

## 2016-05-18 ENCOUNTER — Ambulatory Visit
Admission: RE | Admit: 2016-05-18 | Discharge: 2016-05-18 | Disposition: A | Discharge status: Home / Self Care | Payer: Managed Care, Other (non HMO) | Source: Ambulatory Visit | Attending: Radiation Oncology | Admitting: Radiation Oncology

## 2016-05-18 ENCOUNTER — Telehealth: Payer: Self-pay | Admitting: *Deleted

## 2016-05-18 DIAGNOSIS — C50411 Malignant neoplasm of upper-outer quadrant of right female breast: Secondary | ICD-10-CM | POA: Diagnosis not present

## 2016-05-18 NOTE — Telephone Encounter (Signed)
Christy w/ [facility] called stating she has not been able to get in touch with the patient and she is her representative for her radiation treatments.  Her contact info is [phone] for any questions or concerns.

## 2016-05-19 ENCOUNTER — Ambulatory Visit
Admission: RE | Admit: 2016-05-19 | Discharge: 2016-05-19 | Disposition: A | Discharge status: Home / Self Care | Payer: Managed Care, Other (non HMO) | Source: Ambulatory Visit | Attending: Radiation Oncology | Admitting: Radiation Oncology

## 2016-05-19 ENCOUNTER — Encounter: Payer: Self-pay | Admitting: Radiation Oncology

## 2016-05-19 VITALS — BP 112/74 | HR 80 | Temp 98.8°F | Resp 16 | Wt 121.7 lb

## 2016-05-19 DIAGNOSIS — C50411 Malignant neoplasm of upper-outer quadrant of right female breast: Secondary | ICD-10-CM

## 2016-05-19 NOTE — Progress Notes (Signed)
   Department of Radiation Oncology  Phone:  [phone] Fax:        [phone]  Weekly Treatment Note    Name: Allison Mcclure Date: 05/19/2016 MRN: [id number] DOB: 07-31-1976   Diagnosis:     ICD-9-CM ICD-10-CM   1. Breast cancer of upper-outer quadrant of right female breast ([place]) 174.4 C50.411      Current dose: 23.4Gy  Current fraction: 14   MEDICATIONS: Current Outpatient Prescriptions  Medication Sig Dispense Refill  . ALPRAZolam (XANAX) 0.5 MG tablet Take 0.5 mg by mouth at bedtime as needed for anxiety.    . chlordiazePOXIDE (LIBRIUM) 10 MG capsule Take 5 mg by mouth daily.     . hyaluronate sodium (RADIAPLEXRX) GEL Apply 1 application topically 2 (two) times daily. Apply to skin daily after rad tx and bedtime    . ibuprofen (ADVIL,MOTRIN) 200 MG tablet Take 200 mg by mouth every 8 (eight) hours as needed.    . Lactobacillus (PROBIOTIC ACIDOPHILUS) TABS Take 1 tablet by mouth daily as needed.    . loratadine (ALAVERT) 10 MG tablet Take 10 mg by mouth daily as needed.    Marland Kitchen omeprazole (PRILOSEC) 20 MG capsule TAKE 1 CAPUSLE BY MOUTH DAILY 30 MINS PRIOR TO FIRST MEAL AS NEEDED  1  . triamcinolone (NASACORT) 55 MCG/ACT AERO nasal inhaler Place into the nose.     No current facility-administered medications for this encounter.      ALLERGIES: Review of patient's allergies indicates no known allergies.   LABORATORY DATA:  Lab Results  Component Value Date   WBC 8.4 01/19/2016   HGB 14.7 01/19/2016   HCT 44.9 01/19/2016   MCV 94.5 01/19/2016   PLT 271 01/19/2016   Lab Results  Component Value Date   NA 140 01/19/2016   K 3.7 01/19/2016   CO2 23 01/19/2016   Lab Results  Component Value Date   ALT 12 01/19/2016   AST 13 01/19/2016   ALKPHOS 55 01/19/2016   BILITOT 0.36 01/19/2016     NARRATIVE: Allison Mcclure was seen today for weekly treatment management. The chart was checked and the patient's films were reviewed.  Weekly rad tx right  breast 14/28 completed,  Mild erythema, still feel golf ball size hematoma, skin intact, c/o pain there but has improved stated, appetite good, drinking plenty fluids,  9:45 AM BP 112/74 (BP Location: Left Arm, Patient Position: Sitting, Cuff Size: Normal)   Pulse 80   Temp 98.8 F (37.1 C) (Oral)   Resp 16   Wt 121 lb 11.2 oz (55.2 kg)   LMP 04/21/2016 (Within Days) Comment: Onset of menses 7-11  BMI 22.26 kg/m   Wt Readings from Last 3 Encounters:  05/19/16 121 lb 11.2 oz (55.2 kg)  05/12/16 122 lb 12.8 oz (55.7 kg)  05/05/16 118 lb 12.8 oz (53.9 kg)    PHYSICAL EXAMINATION: weight is 121 lb 11.2 oz (55.2 kg). Her oral temperature is 98.8 F (37.1 C). Her blood pressure is 112/74 and her pulse is 80. Her respiration is 16.      The patient's skin shows mild erythema. No significant desquamation. Overall her skin looks quite good for where he is in her treatment.  ASSESSMENT: The patient is doing satisfactorily with treatment.  PLAN: We will continue with the patient's radiation treatment as planned.

## 2016-05-19 NOTE — Progress Notes (Addendum)
Weekly rad tx right breast 14/28 completed,  Mild erythema, still feel golf ball size hematoma, skin intact, c/o pain there but has improved stated, appetite good, drinking plenty fluids,  9:27 AM BP 112/74 (BP Location: Left Arm, Patient Position: Sitting, Cuff Size: Normal)   Pulse 80   Temp 98.8 F (37.1 C) (Oral)   Resp 16   Wt 121 lb 11.2 oz (55.2 kg)   LMP 04/21/2016 (Within Days) Comment: Onset of menses 7-11  BMI 22.26 kg/m   Wt Readings from Last 3 Encounters:  05/19/16 121 lb 11.2 oz (55.2 kg)  05/12/16 122 lb 12.8 oz (55.7 kg)  05/05/16 118 lb 12.8 oz (53.9 kg)

## 2016-05-23 ENCOUNTER — Ambulatory Visit
Admission: RE | Admit: 2016-05-23 | Discharge: 2016-05-23 | Disposition: A | Discharge status: Home / Self Care | Payer: Managed Care, Other (non HMO) | Source: Ambulatory Visit | Attending: Radiation Oncology | Admitting: Radiation Oncology

## 2016-05-23 DIAGNOSIS — C50411 Malignant neoplasm of upper-outer quadrant of right female breast: Secondary | ICD-10-CM | POA: Diagnosis not present

## 2016-05-24 ENCOUNTER — Ambulatory Visit
Admission: RE | Admit: 2016-05-24 | Discharge: 2016-05-24 | Disposition: A | Discharge status: Home / Self Care | Payer: Managed Care, Other (non HMO) | Source: Ambulatory Visit | Attending: Radiation Oncology | Admitting: Radiation Oncology

## 2016-05-24 ENCOUNTER — Encounter: Payer: Self-pay | Admitting: Radiation Oncology

## 2016-05-24 VITALS — BP 109/58 | HR 76 | Temp 98.2°F | Resp 20 | Wt 123.8 lb

## 2016-05-24 DIAGNOSIS — C50411 Malignant neoplasm of upper-outer quadrant of right female breast: Secondary | ICD-10-CM | POA: Diagnosis not present

## 2016-05-24 NOTE — Addendum Note (Signed)
Encounter addended by: Kyung Rudd, MD on: 05/24/2016  9:43 AM<BR>    Actions taken: Sign clinical note

## 2016-05-24 NOTE — Progress Notes (Signed)
Right breast  Rad txs 16/28 completed, mild erythema, itchiness on mid chest areas, using radiaplex bid, gave another tube, using radaiplex 2-3x day, appetite good,  9:33 AM BP (!) 109/58 (BP Location: Left Arm, Patient Position: Sitting, Cuff Size: Normal)   Pulse 76   Temp 98.2 F (36.8 C) (Oral)   Resp 20   Wt 123 lb 12.8 oz (56.2 kg)   BMI 22.64 kg/m   Wt Readings from Last 3 Encounters:  05/24/16 123 lb 12.8 oz (56.2 kg)  05/19/16 121 lb 11.2 oz (55.2 kg)  05/12/16 122 lb 12.8 oz (55.7 kg)

## 2016-05-24 NOTE — Progress Notes (Signed)
Department of Radiation Oncology  Phone:  [phone] Fax:        [phone]  Weekly Treatment Note    Name: Allison Mcclure Date: 05/24/2016 MRN: [id number] DOB: 1975-10-29   Diagnosis:     ICD-9-CM ICD-10-CM   1. Breast cancer of upper-outer quadrant of right female breast ([place]) 174.4 C50.411      Current dose: 28.8 Gy  Current fraction: 16   MEDICATIONS: Current Outpatient Prescriptions  Medication Sig Dispense Refill  . ALPRAZolam (XANAX) 0.5 MG tablet Take 0.5 mg by mouth at bedtime as needed for anxiety.    . chlordiazePOXIDE (LIBRIUM) 10 MG capsule Take 5 mg by mouth daily.     . hyaluronate sodium (RADIAPLEXRX) GEL Apply 1 application topically 2 (two) times daily. Apply to skin daily after rad tx and bedtime    . ibuprofen (ADVIL,MOTRIN) 200 MG tablet Take 200 mg by mouth every 8 (eight) hours as needed.    . Lactobacillus (PROBIOTIC ACIDOPHILUS) TABS Take 1 tablet by mouth daily as needed.    . loratadine (ALAVERT) 10 MG tablet Take 10 mg by mouth daily as needed.    Marland Kitchen omeprazole (PRILOSEC) 20 MG capsule TAKE 1 CAPUSLE BY MOUTH DAILY 30 MINS PRIOR TO FIRST MEAL AS NEEDED  1  . triamcinolone (NASACORT) 55 MCG/ACT AERO nasal inhaler Place into the nose.     No current facility-administered medications for this encounter.      ALLERGIES: Review of patient's allergies indicates no known allergies.   LABORATORY DATA:  Lab Results  Component Value Date   WBC 8.4 01/19/2016   HGB 14.7 01/19/2016   HCT 44.9 01/19/2016   MCV 94.5 01/19/2016   PLT 271 01/19/2016   Lab Results  Component Value Date   NA 140 01/19/2016   K 3.7 01/19/2016   CO2 23 01/19/2016   Lab Results  Component Value Date   ALT 12 01/19/2016   AST 13 01/19/2016   ALKPHOS 55 01/19/2016   BILITOT 0.36 01/19/2016     NARRATIVE: Allison Mcclure was seen today for weekly treatment management. The chart was checked and the patient's films were reviewed.  Right breast  Rad  txs 16/28 completed, mild erythema, itchiness on mid chest areas, using radiaplex bid, gave another tube, using radaiplex 2-3x day, appetite good,   The patient is having some discomfort in the left breast at the area of a possible cyst. We discussed obtaining an ultrasound of the contralateral breast. 9:41 AM BP 112/74 (BP Location: Left Arm, Patient Position: Sitting, Cuff Size: Normal)   Pulse 80   Temp 98.8 F (37.1 C) (Oral)   Resp 16   Wt 121 lb 11.2 oz (55.2 kg)   LMP 04/21/2016 (Within Days) Comment: Onset of menses 7-11  BMI 22.26 kg/m   Wt Readings from Last 3 Encounters:  05/24/16 123 lb 12.8 oz (56.2 kg)  05/19/16 121 lb 11.2 oz (55.2 kg)  05/12/16 122 lb 12.8 oz (55.7 kg)    PHYSICAL EXAMINATION: weight is 121 lb 11.2 oz (55.2 kg). Her oral temperature is 98.8 F (37.1 C). Her blood pressure is 112/74 and her pulse is 80. Her respiration is 16.      Mild erythema present in the treatment area. Overall her skin looks good.  ASSESSMENT: The patient is doing satisfactorily with treatment.  PLAN: We will continue with the patient's radiation treatment as planned. We discussed obtaining an ultrasound of the contralateral breast given her complaints and physical exam  findings. Suspicion for malignancy is low but the patient does wish to proceed with this.

## 2016-05-25 ENCOUNTER — Encounter: Payer: Self-pay | Admitting: Radiation Oncology

## 2016-05-25 ENCOUNTER — Ambulatory Visit
Admission: RE | Admit: 2016-05-25 | Discharge: 2016-05-25 | Disposition: A | Discharge status: Home / Self Care | Payer: Managed Care, Other (non HMO) | Source: Ambulatory Visit | Attending: Radiation Oncology | Admitting: Radiation Oncology

## 2016-05-25 DIAGNOSIS — C50411 Malignant neoplasm of upper-outer quadrant of right female breast: Secondary | ICD-10-CM | POA: Diagnosis not present

## 2016-05-25 NOTE — Progress Notes (Signed)
Paperwork Psychologist, counselling) received 9/7, given to nurse

## 2016-05-26 ENCOUNTER — Ambulatory Visit
Admission: RE | Admit: 2016-05-26 | Discharge: 2016-05-26 | Disposition: A | Discharge status: Home / Self Care | Payer: Managed Care, Other (non HMO) | Source: Ambulatory Visit | Attending: Radiation Oncology | Admitting: Radiation Oncology

## 2016-05-26 ENCOUNTER — Telehealth: Payer: Self-pay | Admitting: *Deleted

## 2016-05-26 DIAGNOSIS — C50411 Malignant neoplasm of upper-outer quadrant of right female breast: Secondary | ICD-10-CM | POA: Diagnosis not present

## 2016-05-26 NOTE — Telephone Encounter (Signed)
CALLED PATIENT TO INFORM OF ULTRASOUND @ [facility] ON 06-01-16- ARRIVAL TIME - 1:15 PM, LVM FOR A RETURN CALL

## 2016-05-29 ENCOUNTER — Encounter: Payer: Self-pay | Admitting: Radiation Oncology

## 2016-05-29 ENCOUNTER — Ambulatory Visit
Admission: RE | Admit: 2016-05-29 | Discharge: 2016-05-29 | Disposition: A | Discharge status: Home / Self Care | Payer: Managed Care, Other (non HMO) | Source: Ambulatory Visit | Attending: Radiation Oncology | Admitting: Radiation Oncology

## 2016-05-29 DIAGNOSIS — C50411 Malignant neoplasm of upper-outer quadrant of right female breast: Secondary | ICD-10-CM | POA: Diagnosis not present

## 2016-05-29 NOTE — Progress Notes (Signed)
PAPERWORK RECEIVED 9/11, FAXED TO CHRISTY @ [phone], CONF RECEIVED, COPY GIVEN TO PATIENT 9/12

## 2016-05-30 ENCOUNTER — Ambulatory Visit
Admission: RE | Admit: 2016-05-30 | Discharge: 2016-05-30 | Disposition: A | Discharge status: Home / Self Care | Payer: Managed Care, Other (non HMO) | Source: Ambulatory Visit | Attending: Radiation Oncology | Admitting: Radiation Oncology

## 2016-05-30 DIAGNOSIS — C50411 Malignant neoplasm of upper-outer quadrant of right female breast: Secondary | ICD-10-CM | POA: Diagnosis not present

## 2016-05-31 ENCOUNTER — Ambulatory Visit
Admission: RE | Admit: 2016-05-31 | Discharge: 2016-05-31 | Disposition: A | Discharge status: Home / Self Care | Payer: Managed Care, Other (non HMO) | Source: Ambulatory Visit | Attending: Radiation Oncology | Admitting: Radiation Oncology

## 2016-05-31 DIAGNOSIS — C50411 Malignant neoplasm of upper-outer quadrant of right female breast: Secondary | ICD-10-CM | POA: Diagnosis not present

## 2016-05-31 NOTE — Progress Notes (Signed)
Patient came to nursing today after rad tx to right breast c/o gi upset, nausea yesterday not feeling well, thought she had a fever last night, appetite okay, took vitals, wnl, no nausea now, she is going for an u/s left breast  Today where lump on left side arm/breast,says it does hurt,  No other issues, doesn't need to be sen by MD stated, asked if she was achuy or muscles aching, No was stated by patient, saw Shona Simpson, PA in the hall when leaving 10:05 AM

## 2016-06-01 ENCOUNTER — Ambulatory Visit
Admission: RE | Admit: 2016-06-01 | Discharge: 2016-06-01 | Disposition: A | Discharge status: Home / Self Care | Payer: Managed Care, Other (non HMO) | Source: Ambulatory Visit | Attending: Radiation Oncology | Admitting: Radiation Oncology

## 2016-06-01 DIAGNOSIS — C50411 Malignant neoplasm of upper-outer quadrant of right female breast: Secondary | ICD-10-CM | POA: Diagnosis not present

## 2016-06-02 ENCOUNTER — Encounter: Payer: Self-pay | Admitting: Radiation Oncology

## 2016-06-02 ENCOUNTER — Ambulatory Visit
Admission: RE | Admit: 2016-06-02 | Discharge: 2016-06-02 | Disposition: A | Discharge status: Home / Self Care | Payer: Managed Care, Other (non HMO) | Source: Ambulatory Visit | Attending: Radiation Oncology | Admitting: Radiation Oncology

## 2016-06-02 ENCOUNTER — Telehealth: Payer: Self-pay | Admitting: Hematology and Oncology

## 2016-06-02 VITALS — BP 111/66 | HR 71 | Temp 98.4°F | Ht 62.0 in | Wt 122.3 lb

## 2016-06-02 DIAGNOSIS — C50411 Malignant neoplasm of upper-outer quadrant of right female breast: Secondary | ICD-10-CM | POA: Insufficient documentation

## 2016-06-02 DIAGNOSIS — R5383 Other fatigue: Secondary | ICD-10-CM | POA: Insufficient documentation

## 2016-06-02 DIAGNOSIS — L309 Dermatitis, unspecified: Secondary | ICD-10-CM | POA: Insufficient documentation

## 2016-06-02 MED ORDER — SONAFINE EX EMUL
1.0000 | Freq: Two times a day (BID) | CUTANEOUS | Status: DC
Start: 2016-06-02 — End: 2016-06-03
  Administered 2016-06-02: 1 via TOPICAL

## 2016-06-02 NOTE — Progress Notes (Signed)
   Department of Radiation Oncology  Phone:  [phone] Fax:        [phone]  Weekly Treatment Note    Name: Allison Mcclure Date: 06/02/2016 MRN: [id number] DOB: [birth year]   Diagnosis:     ICD-9-CM ICD-10-CM   1. Breast cancer of upper-outer quadrant of right female breast ([place]) 174.4 C50.411      Current dose: 41.4 Gy  Current fraction: 23   MEDICATIONS: Current Outpatient Prescriptions  Medication Sig Dispense Refill  . ALPRAZolam (XANAX) 0.5 MG tablet Take 0.5 mg by mouth at bedtime as needed for anxiety.    . chlordiazePOXIDE (LIBRIUM) 10 MG capsule Take 5 mg by mouth daily.     . hyaluronate sodium (RADIAPLEXRX) GEL Apply 1 application topically 2 (two) times daily. Apply to skin daily after rad tx and bedtime    . ibuprofen (ADVIL,MOTRIN) 200 MG tablet Take 200 mg by mouth every 8 (eight) hours as needed.    . Lactobacillus (PROBIOTIC ACIDOPHILUS) TABS Take 1 tablet by mouth daily as needed.    . loratadine (ALAVERT) 10 MG tablet Take 10 mg by mouth daily as needed.    Marland Kitchen omeprazole (PRILOSEC) 20 MG capsule TAKE 1 CAPUSLE BY MOUTH DAILY 30 MINS PRIOR TO FIRST MEAL AS NEEDED  1  . triamcinolone (NASACORT) 55 MCG/ACT AERO nasal inhaler Place into the nose.     No current facility-administered medications for this encounter.      ALLERGIES: Review of patient's allergies indicates no known allergies.   LABORATORY DATA:  Lab Results  Component Value Date   WBC 8.4 01/19/2016   HGB 14.7 01/19/2016   HCT 44.9 01/19/2016   MCV 94.5 01/19/2016   PLT 271 01/19/2016   Lab Results  Component Value Date   NA 140 01/19/2016   K 3.7 01/19/2016   CO2 23 01/19/2016   Lab Results  Component Value Date   ALT 12 01/19/2016   AST 13 01/19/2016   ALKPHOS 55 01/19/2016   BILITOT 0.36 01/19/2016     NARRATIVE: Allison Mcclure was seen today for weekly treatment management. The chart was checked and the patient's films were reviewed.  Allison Mcclure has  received 23 fractions to her right breast.  She reports fatigue today.  Continues to work.  Note bright erythema of her breast with rash-like appearance in the upper, inner quadrant.  Skin remains intact.  Hematoma right lateral breast.   PHYSICAL EXAMINATION: height is 5\' 2"  (1.575 m) and weight is 122 lb 4.8 oz (55.5 kg). Her temperature is 98.4 F (36.9 C). Her blood pressure is 111/66 and her pulse is 71.      Some dermatitis is present especially in the upper inner aspect of the treatment area. No desquamation present.  ASSESSMENT: The patient is doing satisfactorily with treatment.  PLAN: We will continue with the patient's radiation treatment as planned.

## 2016-06-02 NOTE — Addendum Note (Signed)
Encounter addended by: Benn Moulder, RN on: 06/02/2016  1:00 PM<BR>    Actions taken: MAR administration accepted

## 2016-06-02 NOTE — Progress Notes (Signed)
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

## 2016-06-02 NOTE — Telephone Encounter (Signed)
Pt wants to speak with Dr. Lindi Adie prior to appt on 9/29 so she can have a discussion without her parents present.  Appt made for 9/25 after radiation.  Pt voiced understanding.

## 2016-06-02 NOTE — Progress Notes (Signed)
Allison Mcclure has received 23 fractions to her right breast.  She reports fatigue today.  Continues to work.  Note bright erythema of her breast with rash-like appearance in the upper, inner quadrant.  Skin remains intact.  Hematoma right lateral breast.

## 2016-06-02 NOTE — Telephone Encounter (Signed)
Pt called wanting to schedule an appt with Dr. Lindi Adie sooner then scheduled appt. Advised pt that I would send message to desk nurse. Pt did not want to say what the concern was.

## 2016-06-02 NOTE — Addendum Note (Signed)
Encounter addended by: Benn Moulder, RN on: 06/02/2016 12:58 PM<BR>    Actions taken: Order Entry activity accessed, Diagnosis association updated

## 2016-06-05 ENCOUNTER — Encounter: Payer: Self-pay | Admitting: Radiation Oncology

## 2016-06-05 ENCOUNTER — Ambulatory Visit
Admission: RE | Admit: 2016-06-05 | Discharge: 2016-06-05 | Disposition: A | Discharge status: Home / Self Care | Payer: Managed Care, Other (non HMO) | Source: Ambulatory Visit | Attending: Radiation Oncology | Admitting: Radiation Oncology

## 2016-06-05 DIAGNOSIS — C50411 Malignant neoplasm of upper-outer quadrant of right female breast: Secondary | ICD-10-CM | POA: Diagnosis not present

## 2016-06-06 ENCOUNTER — Ambulatory Visit
Admission: RE | Admit: 2016-06-06 | Discharge: 2016-06-06 | Disposition: A | Discharge status: Home / Self Care | Payer: Managed Care, Other (non HMO) | Source: Ambulatory Visit | Attending: Radiation Oncology | Admitting: Radiation Oncology

## 2016-06-06 DIAGNOSIS — C50411 Malignant neoplasm of upper-outer quadrant of right female breast: Secondary | ICD-10-CM | POA: Diagnosis not present

## 2016-06-06 NOTE — Progress Notes (Signed)
Patient came bny c/o rash all over her body,abdomen, arms and legs, minute bumps seen on arms and abdomen, asked if she had changed her detergent,soaps, eaten anything new, wore new clothes without washing?drinking anything new?'No stated patient, then she stated"I just started using sonafine cream Friday, that's the only new thing I have changed", asked her then when she started getting a rash?'Saturday stated patient", asked her to stop using the sonafine cream and buy benadryl cream  For the areas that itch her most, she used benadryl tablet last night, will stop the sonafine and get benadryl cream, will go back to radiaplex gel, asked her if she needed any more radiaplex?"No, I have enough" thanked this Rn, also infomred her that jher U/S result was neg, and Bryson Ha ws pleased with the results

## 2016-06-07 ENCOUNTER — Ambulatory Visit
Admission: RE | Admit: 2016-06-07 | Discharge: 2016-06-07 | Disposition: A | Discharge status: Home / Self Care | Payer: Managed Care, Other (non HMO) | Source: Ambulatory Visit | Attending: Radiation Oncology | Admitting: Radiation Oncology

## 2016-06-07 DIAGNOSIS — C50411 Malignant neoplasm of upper-outer quadrant of right female breast: Secondary | ICD-10-CM | POA: Diagnosis not present

## 2016-06-08 ENCOUNTER — Ambulatory Visit
Admission: RE | Admit: 2016-06-08 | Discharge: 2016-06-08 | Disposition: A | Discharge status: Home / Self Care | Payer: Managed Care, Other (non HMO) | Source: Ambulatory Visit | Attending: Radiation Oncology | Admitting: Radiation Oncology

## 2016-06-08 DIAGNOSIS — C50411 Malignant neoplasm of upper-outer quadrant of right female breast: Secondary | ICD-10-CM

## 2016-06-08 DIAGNOSIS — L309 Dermatitis, unspecified: Secondary | ICD-10-CM | POA: Insufficient documentation

## 2016-06-08 MED ORDER — HYDROXYZINE HCL 10 MG PO TABS: 10.0000 mg | ORAL_TABLET | Freq: Three times a day (TID) | ORAL | 0 refills | Status: DC | PRN

## 2016-06-08 MED ORDER — METHYLPREDNISOLONE 4 MG PO TBPK: ORAL_TABLET | ORAL | 0 refills | Status: DC

## 2016-06-08 NOTE — Progress Notes (Signed)
Department of Radiation Oncology  Phone:  [phone] Fax:        [phone]  Weekly Treatment Note    Name: Allison Mcclure Date: 06/08/2016 MRN: [id number] DOB: 05/15/1976   Diagnosis:     ICD-9-CM ICD-10-CM   1. Breast cancer of upper-outer quadrant of right female breast ([place]) 174.4 C50.411   2. Dermatitis 692.9 L30.9      Current dose: 48.6 Gy  Current fraction: 27   MEDICATIONS: Current Outpatient Prescriptions  Medication Sig Dispense Refill  . ALPRAZolam (XANAX) 0.5 MG tablet Take 0.5 mg by mouth at bedtime as needed for anxiety.    . chlordiazePOXIDE (LIBRIUM) 10 MG capsule Take 5 mg by mouth daily.     . hyaluronate sodium (RADIAPLEXRX) GEL Apply 1 application topically 2 (two) times daily. Apply to skin daily after rad tx and bedtime    . hydrOXYzine (ATARAX/VISTARIL) 10 MG tablet Take 1 tablet (10 mg total) by mouth 3 (three) times daily as needed for itching. 30 tablet 0  . ibuprofen (ADVIL,MOTRIN) 200 MG tablet Take 200 mg by mouth every 8 (eight) hours as needed.    . Lactobacillus (PROBIOTIC ACIDOPHILUS) TABS Take 1 tablet by mouth daily as needed.    . loratadine (ALAVERT) 10 MG tablet Take 10 mg by mouth daily as needed.    . methylPREDNISolone (MEDROL DOSEPAK) 4 MG TBPK tablet Take 6 tabs po on day 1, 5 tabs po on day 2, 4 tabs po on day 3, 3 tabs po day 4, 2 tabs po day 5, 1 tab po day 6 then stop 21 tablet 0  . omeprazole (PRILOSEC) 20 MG capsule TAKE 1 CAPUSLE BY MOUTH DAILY 30 MINS PRIOR TO FIRST MEAL AS NEEDED  1  . traMADol (ULTRAM) 50 MG tablet Take 50 mg by mouth as needed.    . triamcinolone (NASACORT) 55 MCG/ACT AERO nasal inhaler Place into the nose.     No current facility-administered medications for this encounter.      ALLERGIES: Sonafine [wound dressings]   LABORATORY DATA:  Lab Results  Component Value Date   WBC 8.4 01/19/2016   HGB 14.7 01/19/2016   HCT 44.9 01/19/2016   MCV 94.5 01/19/2016   PLT 271 01/19/2016    Lab Results  Component Value Date   NA 140 01/19/2016   K 3.7 01/19/2016   CO2 23 01/19/2016   Lab Results  Component Value Date   ALT 12 01/19/2016   AST 13 01/19/2016   ALKPHOS 55 01/19/2016   BILITOT 0.36 01/19/2016     NARRATIVE: KOMAL FAILING was seen today for concerns with progressive rash that she noted on her arms and legs and abdomen since Sunday which are progressively getting worse. She continues to have pruritits and white lesions on her skin. She has tried benadryl cream and oral benadryl to sleep. She is miserable per report and is tearful. She denies any newly noted creams, detergents, linens, or hot tub exposure.  PHYSICAL EXAMINATION:  The right breast has hyperpigmentation consistent with treatment but without breakdown or desquamation. The remainder of her skin is intact but there are multple maculopapular changes, hyperemic and noted on the upper and lower extremites as well as the anterior surface of the abdomen. No lesions are seen in the posterior thorax. HEENT reveals NCAT, EOMS are intact bilaterally. There is a aphthous ulcer in the right mandibular gum line. Cardiovascular exam reveals RRR, no CRM. Chest is CTAB.   ASSESSMENT/PLAN: 1. 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 The patient will continue treatment and be seen by Dr. [name] tomorrow for her weekly under treatment visit. 2. Cutaneous rash. I am uncertain the nature of this rash but it does appear to be consistent with either immune mediated or contact exposure. The patient has been refractory to OTC antihistamines. I have recommended hydroxazine and a solumedrol dosepak. These have been called to her pharmacy. She will take off today thru Monday from work to recover from this, and return next Tuesday 06/13/16.    Carola Rhine, PAC

## 2016-06-09 ENCOUNTER — Ambulatory Visit
Admission: RE | Admit: 2016-06-09 | Discharge: 2016-06-09 | Disposition: A | Discharge status: Home / Self Care | Payer: Managed Care, Other (non HMO) | Source: Ambulatory Visit | Attending: Radiation Oncology | Admitting: Radiation Oncology

## 2016-06-09 ENCOUNTER — Encounter: Payer: Self-pay | Admitting: Radiation Oncology

## 2016-06-09 VITALS — BP 113/79 | HR 55 | Temp 98.7°F | Resp 16 | Ht 62.0 in | Wt 120.7 lb

## 2016-06-09 DIAGNOSIS — C50411 Malignant neoplasm of upper-outer quadrant of right female breast: Secondary | ICD-10-CM

## 2016-06-09 NOTE — Progress Notes (Addendum)
Right breast  Rad txs 28/28 completed, mild erythema, itchiness on mid chest areas, using radiaplex bid to tid, gave another tube, using radaiplex 2-3x day, appetite good.  Having fatigue all during the day, started medrol dose pack yesterday feels better today.   Wt Readings from Last 3 Encounters:  06/09/16 120 lb 11.2 oz (54.7 kg)  06/02/16 122 lb 4.8 oz (55.5 kg)  05/31/16 122 lb 1.6 oz (55.4 kg)  BP 113/79 (BP Location: Left Arm, Patient Position: Sitting, Cuff Size: Normal)   Pulse (!) 55   Temp 98.7 F (37.1 C) (Oral)   Resp 16   Ht 5\' 2"  (1.575 m)   Wt 120 lb 11.2 oz (54.7 kg)   SpO2 100%   BMI 22.08 kg/m

## 2016-06-09 NOTE — Progress Notes (Signed)
Department of Radiation Oncology  Phone:  [phone] Fax:        [phone]  Weekly Treatment Note    Name: Allison Mcclure Date: 06/10/2016 MRN: [id number] DOB: May 25, 1976   Diagnosis:     ICD-9-CM ICD-10-CM   1. Breast cancer of upper-outer quadrant of right female breast ([place]) 174.4 C50.411      Current dose: 50.4 Gy  Current fraction: 28   MEDICATIONS: Current Outpatient Prescriptions  Medication Sig Dispense Refill  . chlordiazePOXIDE (LIBRIUM) 10 MG capsule Take 5 mg by mouth daily.     . hyaluronate sodium (RADIAPLEXRX) GEL Apply 1 application topically 2 (two) times daily. Apply to skin daily after rad tx and bedtime    . hydrOXYzine (ATARAX/VISTARIL) 10 MG tablet Take 1 tablet (10 mg total) by mouth 3 (three) times daily as needed for itching. 30 tablet 0  . loratadine (ALAVERT) 10 MG tablet Take 10 mg by mouth daily as needed.    . methylPREDNISolone (MEDROL DOSEPAK) 4 MG TBPK tablet Take 6 tabs po on day 1, 5 tabs po on day 2, 4 tabs po on day 3, 3 tabs po day 4, 2 tabs po day 5, 1 tab po day 6 then stop 21 tablet 0  . omeprazole (PRILOSEC) 20 MG capsule TAKE 1 CAPUSLE BY MOUTH DAILY 30 MINS PRIOR TO FIRST MEAL AS NEEDED  1  . triamcinolone (NASACORT) 55 MCG/ACT AERO nasal inhaler Place into the nose.    Marland Kitchen ALPRAZolam (XANAX) 0.5 MG tablet Take 0.5 mg by mouth at bedtime as needed for anxiety.    Marland Kitchen ibuprofen (ADVIL,MOTRIN) 200 MG tablet Take 200 mg by mouth every 8 (eight) hours as needed.    . Lactobacillus (PROBIOTIC ACIDOPHILUS) TABS Take 1 tablet by mouth daily as needed.    . traMADol (ULTRAM) 50 MG tablet Take 50 mg by mouth as needed.     No current facility-administered medications for this encounter.      ALLERGIES: Sonafine [wound dressings]   LABORATORY DATA:  Lab Results  Component Value Date   WBC 8.4 01/19/2016   HGB 14.7 01/19/2016   HCT 44.9 01/19/2016   MCV 94.5 01/19/2016   PLT 271 01/19/2016   Lab Results  Component Value  Date   NA 140 01/19/2016   K 3.7 01/19/2016   CO2 23 01/19/2016   Lab Results  Component Value Date   ALT 12 01/19/2016   AST 13 01/19/2016   ALKPHOS 55 01/19/2016   BILITOT 0.36 01/19/2016     NARRATIVE: Allison Mcclure was seen today for weekly treatment management. The chart was checked and the patient's films were reviewed.  Patient presents with mild erythema, itchiness on mild chest areas. She uses Radiaplex bid to tid. She was given another tube. She notes a good appetite. She notes fatigue during the day. She started medrol dose pack yesterday and notes feeling better today.  PHYSICAL EXAMINATION: height is 5\' 2"  (1.575 m) and weight is 120 lb 11.2 oz (54.7 kg). Her oral temperature is 98.7 F (37.1 C). Her blood pressure is 113/79 and her pulse is 55 (abnormal). Her respiration is 16 and oxygen saturation is 100%.     Diffuse erythema in treatment area, no significant desquamation.   ASSESSMENT: The patient is doing satisfactorily with treatment.  PLAN: We will continue with the patient's radiation treatment as planned.        This document serves as a record of services personally performed by Kyung Rudd,  MD. It was created on his behalf by Bethann Humble, a trained medical scribe. The creation of this record is based on the scribe's personal observations and the provider's statements to them. This document has been checked and approved by the attending provider.

## 2016-06-12 ENCOUNTER — Ambulatory Visit
Admission: RE | Admit: 2016-06-12 | Discharge: 2016-06-12 | Disposition: A | Discharge status: Home / Self Care | Payer: Managed Care, Other (non HMO) | Source: Ambulatory Visit | Attending: Radiation Oncology | Admitting: Radiation Oncology

## 2016-06-12 ENCOUNTER — Encounter: Payer: Self-pay | Admitting: Hematology and Oncology

## 2016-06-12 ENCOUNTER — Ambulatory Visit (HOSPITAL_BASED_OUTPATIENT_CLINIC_OR_DEPARTMENT_OTHER): Payer: Managed Care, Other (non HMO) | Admitting: Hematology and Oncology

## 2016-06-12 DIAGNOSIS — C50411 Malignant neoplasm of upper-outer quadrant of right female breast: Secondary | ICD-10-CM | POA: Diagnosis not present

## 2016-06-12 DIAGNOSIS — Z170 Estrogen receptor positive status [ER+]: Secondary | ICD-10-CM

## 2016-06-12 MED ORDER — TAMOXIFEN CITRATE 20 MG PO TABS: 20.0000 mg | ORAL_TABLET | Freq: Every day | ORAL | 3 refills | Status: DC

## 2016-06-12 NOTE — Assessment & Plan Note (Signed)
Right lumpectomy 03/01/2016: IDC grade 1, 0.7 cm, margins negative, 0/4 lymph nodes negative, ER 100%, PR 100%, Ki-67 10%, HER-2 negative ratio 1.32, T1 BN 0 stage IA Oncotype DX score 8: 6% risk of recurrence low risk Adjuvant radiation therapy from 05/02/2016 to 06/09/2016  Treatment plan: Adjuvant antiestrogen therapy with tamoxifen 20 mg daily 5-10 years Tamoxifen counseling: We discussed the risks and benefits of tamoxifen. These include but not limited to insomnia, hot flashes, mood changes, vaginal dryness, and weight gain. Although rare, serious side effects including endometrial cancer, risk of blood clots were also discussed. We strongly believe that the benefits far outweigh the risks. Patient understands these risks and consented to starting treatment. Planned treatment duration is 5-10 years.  Return to clinic in 3 months for toxicity check on tamoxifen therapy.

## 2016-06-12 NOTE — Progress Notes (Signed)
Patient Care Team: Kathyrn Lass, MD as PCP - General (Family Medicine)  DIAGNOSIS: Breast cancer of upper-outer quadrant of right female breast [facility])   Staging form: Breast, AJCC 7th Edition   - Clinical stage from 01/19/2016: Stage IA (T1b, N0, M0) - Unsigned         Staging comments: Staged at breast conference on 5.3.17  SUMMARY OF ONCOLOGIC HISTORY:   Breast cancer of upper-outer quadrant of right female breast ([name])   01/03/2016 Initial Diagnosis    Right breast biopsy: Invasive ductal carcinoma with LVI, grade 2,ER 100%, PR 100%, Ki-67 10%, HER-2 negative ratio 1.23      01/03/2016 Mammogram    screening detected asymmetry in the right breast ultrasound measured 8 mm at 10:00 position axillary normal, T1b N0 stage IA      03/01/2016 Surgery    Right lumpectomy: IDC grade 1, 0.7 cm, margins negative, 0/4 lymph nodes negative, ER 100%, PR 100%, Ki-67 10%, HER-2 negative ratio 1.32, T1 BN 0 stage IA, Oncotype DX 8, 6% ROR      05/02/2016 - 06/09/2016 Radiation Therapy    Adjuvant radiation therapy       CHIEF COMPLIANT: Follow-up on radiation therapy  INTERVAL HISTORY: AMIYLAH Mcclure is a 40 year old with above-mentioned history of right breast cancer who underwent lumpectomy and is currently receiving adjuvant radiation. He is going to getting a boost currently. She is extremely sore under the arm and has shooting pains through the breast. She is here today to discuss adjuvant antiestrogen therapy and its side effects.  REVIEW OF SYSTEMS:   Constitutional: Denies fevers, chills or abnormal weight loss Eyes: Denies blurriness of vision Ears, nose, mouth, throat, and face: Denies mucositis or sore throat Respiratory: Denies cough, dyspnea or wheezes Cardiovascular: Denies palpitation, chest discomfort Gastrointestinal:  Denies nausea, heartburn or change in bowel habits Skin: Radiation dermatitis in the right breast and axilla Lymphatics: Denies new lymphadenopathy or easy  bruising Neurological:Denies numbness, tingling or new weaknesses Behavioral/Psych: Mood is stable, no new changes  Extremities: No lower extremity edema Breast: Right breast and right axillary pain related to radiation  All other systems were reviewed with the patient and are negative.  I have reviewed the past medical history, past surgical history, social history and family history with the patient and they are unchanged from previous note.  ALLERGIES:  is allergic to sonafine [wound dressings]. Is  MEDICATIONS:  Current Outpatient Prescriptions  Medication Sig Dispense Refill  . ALPRAZolam (XANAX) 0.5 MG tablet Take 0.5 mg by mouth at bedtime as needed for anxiety.    . chlordiazePOXIDE (LIBRIUM) 10 MG capsule Take 5 mg by mouth daily.     . hyaluronate sodium (RADIAPLEXRX) GEL Apply 1 application topically 2 (two) times daily. Apply to skin daily after rad tx and bedtime    . hydrOXYzine (ATARAX/VISTARIL) 10 MG tablet Take 1 tablet (10 mg total) by mouth 3 (three) times daily as needed for itching. 30 tablet 0  . ibuprofen (ADVIL,MOTRIN) 200 MG tablet Take 200 mg by mouth every 8 (eight) hours as needed.    . Lactobacillus (PROBIOTIC ACIDOPHILUS) TABS Take 1 tablet by mouth daily as needed.    . loratadine (ALAVERT) 10 MG tablet Take 10 mg by mouth daily as needed.    . methylPREDNISolone (MEDROL DOSEPAK) 4 MG TBPK tablet Take 6 tabs po on day 1, 5 tabs po on day 2, 4 tabs po on day 3, 3 tabs po day 4, 2 tabs po  day 5, 1 tab po day 6 then stop 21 tablet 0  . omeprazole (PRILOSEC) 20 MG capsule TAKE 1 CAPUSLE BY MOUTH DAILY 30 MINS PRIOR TO FIRST MEAL AS NEEDED  1  . traMADol (ULTRAM) 50 MG tablet Take 50 mg by mouth as needed.    . triamcinolone (NASACORT) 55 MCG/ACT AERO nasal inhaler Place into the nose.     No current facility-administered medications for this visit.     PHYSICAL EXAMINATION: ECOG PERFORMANCE STATUS: 1 - Symptomatic but completely ambulatory  Vitals:    06/12/16 1017  BP: 128/83  Pulse: 64  Resp: 17  Temp: 98.8 F (37.1 C)   Filed Weights   06/12/16 1017  Weight: 122 lb 9.6 oz (55.6 kg)    GENERAL:alert, no distress and comfortable SKIN: skin color, texture, turgor are normal, no rashes or significant lesions EYES: normal, Conjunctiva are pink and non-injected, sclera clear OROPHARYNX:no exudate, no erythema and lips, buccal mucosa, and tongue normal  NECK: supple, thyroid normal size, non-tender, without nodularity LYMPH:  no palpable lymphadenopathy in the cervical, axillary or inguinal LUNGS: clear to auscultation and percussion with normal breathing effort HEART: regular rate & rhythm and no murmurs and no lower extremity edema ABDOMEN:abdomen soft, non-tender and normal bowel sounds MUSCULOSKELETAL:no cyanosis of digits and no clubbing  NEURO: alert & oriented x 3 with fluent speech, no focal motor/sensory deficits EXTREMITIES: No lower extremity edema  LABORATORY DATA:  I have reviewed the data as listed   Chemistry      Component Value Date/Time   NA 140 01/19/2016 1227   K 3.7 01/19/2016 1227   CO2 23 01/19/2016 1227   BUN 15.1 01/19/2016 1227   CREATININE 0.9 01/19/2016 1227      Component Value Date/Time   CALCIUM 9.2 01/19/2016 1227   ALKPHOS 55 01/19/2016 1227   AST 13 01/19/2016 1227   ALT 12 01/19/2016 1227   BILITOT 0.36 01/19/2016 1227       Lab Results  Component Value Date   WBC 8.4 01/19/2016   HGB 14.7 01/19/2016   HCT 44.9 01/19/2016   MCV 94.5 01/19/2016   PLT 271 01/19/2016   NEUTROABS 6.2 01/19/2016     ASSESSMENT & PLAN:  Breast cancer of upper-outer quadrant of right female breast ([name]) Right lumpectomy 03/01/2016: IDC grade 1, 0.7 cm, margins negative, 0/4 lymph nodes negative, ER 100%, PR 100%, Ki-67 10%, HER-2 negative ratio 1.32, T1 BN 0 stage IA Oncotype DX score 8: 6% risk of recurrence low risk Adjuvant radiation therapy from 05/02/2016 to 06/09/2016  Treatment plan:  Adjuvant antiestrogen therapy with tamoxifen 20 mg daily 5-10 years, Recommended starting 07/19/2016 Tamoxifen counseling: We discussed the risks and benefits of tamoxifen. These include but not limited to insomnia, hot flashes, mood changes, vaginal dryness, and weight gain. Although rare, serious side effects including endometrial cancer, risk of blood clots were also discussed. We strongly believe that the benefits far outweigh the risks. Patient understands these risks and consented to starting treatment. Planned treatment duration is 5-10 years.  Sexual concerns: I discussed at length about libido and vaginal dryness. We discussed some other measures that she could take if she develops those symptoms. She is not contemplating on having children at this time but she may do so in the future. I discussed with her that implanted IUDs are reasonable methods of contraception as long as she remains on tamoxifen. I discussed with her that she should not get pregnant while taking tamoxifen.  We discussed vaginal lubricants as well as Coconut oil as well as vaginal estrogen creams are all potential treatment options for vaginal dryness or she develops them.   Return to clinic in 4 months for toxicity check on tamoxifen therapy.  No orders of the defined types were placed in this encounter.  The patient has a good understanding of the overall plan. she agrees with it. she will call with any problems that may develop before the next visit here.   Rulon Eisenmenger, MD 06/12/16

## 2016-06-13 ENCOUNTER — Ambulatory Visit
Admission: RE | Admit: 2016-06-13 | Discharge: 2016-06-13 | Disposition: A | Discharge status: Home / Self Care | Payer: Managed Care, Other (non HMO) | Source: Ambulatory Visit | Attending: Radiation Oncology | Admitting: Radiation Oncology

## 2016-06-13 DIAGNOSIS — C50411 Malignant neoplasm of upper-outer quadrant of right female breast: Secondary | ICD-10-CM | POA: Diagnosis not present

## 2016-06-14 ENCOUNTER — Ambulatory Visit
Admission: RE | Admit: 2016-06-14 | Discharge: 2016-06-14 | Disposition: A | Discharge status: Home / Self Care | Payer: Managed Care, Other (non HMO) | Source: Ambulatory Visit | Attending: Radiation Oncology | Admitting: Radiation Oncology

## 2016-06-14 DIAGNOSIS — C50411 Malignant neoplasm of upper-outer quadrant of right female breast: Secondary | ICD-10-CM | POA: Diagnosis not present

## 2016-06-15 ENCOUNTER — Ambulatory Visit
Admission: RE | Admit: 2016-06-15 | Discharge: 2016-06-15 | Disposition: A | Discharge status: Home / Self Care | Payer: Managed Care, Other (non HMO) | Source: Ambulatory Visit | Attending: Radiation Oncology | Admitting: Radiation Oncology

## 2016-06-15 DIAGNOSIS — C50411 Malignant neoplasm of upper-outer quadrant of right female breast: Secondary | ICD-10-CM | POA: Diagnosis not present

## 2016-06-16 ENCOUNTER — Ambulatory Visit: Payer: Managed Care, Other (non HMO) | Admitting: Hematology and Oncology

## 2016-06-16 ENCOUNTER — Ambulatory Visit
Admission: RE | Admit: 2016-06-16 | Discharge: 2016-06-16 | Disposition: A | Discharge status: Home / Self Care | Payer: Managed Care, Other (non HMO) | Source: Ambulatory Visit | Attending: Radiation Oncology | Admitting: Radiation Oncology

## 2016-06-16 ENCOUNTER — Encounter: Payer: Self-pay | Admitting: Radiation Oncology

## 2016-06-16 VITALS — BP 137/90 | HR 96 | Temp 98.4°F | Resp 169 | Wt 121.1 lb

## 2016-06-16 DIAGNOSIS — C50411 Malignant neoplasm of upper-outer quadrant of right female breast: Secondary | ICD-10-CM

## 2016-06-16 NOTE — Progress Notes (Signed)
  Radiation Oncology         [phone] ________________________________  Name: Allison Mcclure MRN: [id number]  Date: 06/05/2016  DOB: [birth year]  Complex simulation note  The patient has undergone complex simulation for her upcoming boost treatment for her diagnosis of right-sided breast cancer. The patient has initially been planned to receive 50.4 Gy. The patient will now receive a 10 Gy boost to the seroma cavity which has been contoured. This will be accomplished using an en face electron field. Based on the depth of the target area, 18 MeV electrons will be used. The patient's final total dose therefore will be 60.4 Gy. A complex isodose plan from the electron [facility] Carlo calculation is requested for the boost treatment.   _______________________________  Jodelle Gross, MD, PhD

## 2016-06-16 NOTE — Progress Notes (Signed)
weekly rad tx  33/33 completd, right breast , bright erythema , and under inframmary fold thinning, looks like it might peel,  Using radiaplex bid, 1 month f/u appr given  With [facility] 08/01/16 9:44 AM BP 137/90 (BP Location: Left Arm, Patient Position: Sitting, Cuff Size: Normal)   Pulse 96   Temp 98.4 F (36.9 C) (Oral)   Resp (!) 169   Wt 121 lb 1.6 oz (54.9 kg)   BMI 22.15 kg/m   Wt Readings from Last 3 Encounters:  06/16/16 121 lb 1.6 oz (54.9 kg)  06/12/16 122 lb 9.6 oz (55.6 kg)  06/09/16 120 lb 11.2 oz (54.7 kg)

## 2016-06-16 NOTE — Progress Notes (Signed)
Department of Radiation Oncology  Phone:  [phone] Fax:        [phone]  Weekly Treatment Note    Name: Allison Mcclure Date: 06/16/2016 MRN: [id number] DOB: 12-12-1975   Diagnosis:     ICD-9-CM ICD-10-CM   1. Breast cancer of upper-outer quadrant of right female breast ([place]) 174.4 C50.411      Current dose: 60.4 Gy  Current fraction: 33   MEDICATIONS: Current Outpatient Prescriptions  Medication Sig Dispense Refill  . ALPRAZolam (XANAX) 0.5 MG tablet Take 0.5 mg by mouth at bedtime as needed for anxiety.    . chlordiazePOXIDE (LIBRIUM) 10 MG capsule Take 5 mg by mouth daily.     . hyaluronate sodium (RADIAPLEXRX) GEL Apply 1 application topically 2 (two) times daily. Apply to skin daily after rad tx and bedtime    . hydrOXYzine (ATARAX/VISTARIL) 10 MG tablet Take 1 tablet (10 mg total) by mouth 3 (three) times daily as needed for itching. 30 tablet 0  . ibuprofen (ADVIL,MOTRIN) 200 MG tablet Take 200 mg by mouth every 8 (eight) hours as needed.    . Lactobacillus (PROBIOTIC ACIDOPHILUS) TABS Take 1 tablet by mouth daily as needed.    . loratadine (ALAVERT) 10 MG tablet Take 10 mg by mouth daily as needed.    . methylPREDNISolone (MEDROL DOSEPAK) 4 MG TBPK tablet Take 6 tabs po on day 1, 5 tabs po on day 2, 4 tabs po on day 3, 3 tabs po day 4, 2 tabs po day 5, 1 tab po day 6 then stop 21 tablet 0  . omeprazole (PRILOSEC) 20 MG capsule TAKE 1 CAPUSLE BY MOUTH DAILY 30 MINS PRIOR TO FIRST MEAL AS NEEDED  1  . [START ON 07/19/2016] tamoxifen (NOLVADEX) 20 MG tablet Take 1 tablet (20 mg total) by mouth daily. 90 tablet 3  . traMADol (ULTRAM) 50 MG tablet Take 50 mg by mouth as needed.    . triamcinolone (NASACORT) 55 MCG/ACT AERO nasal inhaler Place into the nose.     No current facility-administered medications for this encounter.      ALLERGIES: Sonafine [wound dressings]   LABORATORY DATA:  Lab Results  Component Value Date   WBC 8.4 01/19/2016   HGB  14.7 01/19/2016   HCT 44.9 01/19/2016   MCV 94.5 01/19/2016   PLT 271 01/19/2016   Lab Results  Component Value Date   NA 140 01/19/2016   K 3.7 01/19/2016   CO2 23 01/19/2016   Lab Results  Component Value Date   ALT 12 01/19/2016   AST 13 01/19/2016   ALKPHOS 55 01/19/2016   BILITOT 0.36 01/19/2016     NARRATIVE: Allison Mcclure was seen today for weekly treatment management. The chart was checked and the patient's films were reviewed.  weekly rad tx  33/33 completd, right breast , bright erythema , and under inframmary fold thinning, looks like it might peel,  Using radiaplex bid, 1 month f/u appr given  With [facility] 08/01/16 10:27 AM BP 137/90 (BP Location: Left Arm, Patient Position: Sitting, Cuff Size: Normal)   Pulse 96   Temp 98.4 F (36.9 C) (Oral)   Resp (!) 169   Wt 121 lb 1.6 oz (54.9 kg)   BMI 22.15 kg/m   Wt Readings from Last 3 Encounters:  06/16/16 121 lb 1.6 oz (54.9 kg)  06/12/16 122 lb 9.6 oz (55.6 kg)  06/09/16 120 lb 11.2 oz (54.7 kg)    PHYSICAL EXAMINATION: weight is 121 lb  1.6 oz (54.9 kg). Her oral temperature is 98.4 F (36.9 C). Her blood pressure is 137/90 and her pulse is 96. Her respiration is 169 (abnormal).      Diffuse right erythema is present with some increased hyperpigmentation in the inframammary region as well as the right axilla. No moist desquamation.  ASSESSMENT: The patient is doing satisfactorily with treatment. She finished her final fraction today. Overall she has done quite well. I am pleased that her discomfort has improved during her course of radiation treatment which we felt was related to the patient's hematoma.  PLAN: The patient will follow-up in our clinic in 1 month.

## 2016-06-19 ENCOUNTER — Telehealth: Payer: Self-pay | Admitting: *Deleted

## 2016-06-19 ENCOUNTER — Encounter: Payer: Self-pay | Admitting: *Deleted

## 2016-06-19 DIAGNOSIS — C50411 Malignant neoplasm of upper-outer quadrant of right female breast: Secondary | ICD-10-CM

## 2016-06-19 DIAGNOSIS — Z170 Estrogen receptor positive status [ER+]: Principal | ICD-10-CM

## 2016-06-19 NOTE — Telephone Encounter (Signed)
Call from [place] with [employer] calling to verify pt is to start on Tamoxifen. Confirmed per office note, the plan is for pt to begin Tamoxifen 07/19/16.

## 2016-06-28 ENCOUNTER — Encounter: Payer: Self-pay | Admitting: Radiation Oncology

## 2016-06-28 NOTE — Progress Notes (Signed)
°  Radiation Oncology         [phone] ________________________________  Name: Allison Mcclure MRN: [id number]  Date: 06/28/2016  DOB: [birth year]  End of Treatment Note  Diagnosis:   Breast cancer of upper-outer quadrant of right breast    Indication for treatment:  Curative       Radiation treatment dates:   05/02/16-06/16/16  Site/dose:   1)Right breast/ 50.4 Gy in 28 fx   2) Boost / 10 Gy in 5 fx  Beams/energy:   1) 3D / 6X        2) Isodose Plan/ 18 MeV  Narrative: The patient tolerated radiation treatment relatively well.   Patient presented with bright erythema on right breast and thinning under the inframmary fold.  Plan: The patient has completed radiation treatment. The patient will return to radiation oncology clinic for routine followup in one month. I advised them to call or return sooner if they have any questions or concerns related to their recovery or treatment.  ------------------------------------------------  Jodelle Gross, MD, PhD  This document serves as a record of services personally performed by Kyung Rudd, MD. It was created on his behalf by Bethann Humble, a trained medical scribe. The creation of this record is based on the scribe's personal observations and the provider's statements to them. This document has been checked and approved by the attending provider.

## 2016-06-29 NOTE — Progress Notes (Signed)
°  Radiation Oncology         [phone] ________________________________  Name: Allison Mcclure MRN: [id number]  Date: 05/01/2016  DOB: 1976/03/28  Simulation Verification Note  Status: inpatient  NARRATIVE: The patient was brought to the treatment unit and placed in the planned treatment position. The clinical setup was verified. Then port films were obtained and uploaded to the radiation oncology medical record software.  The treatment beams were carefully compared against the planned radiation fields. The position location and shape of the radiation fields was reviewed. They targeted volume of tissue appears to be appropriately covered by the radiation beams. Organs at risk appear to be excluded as planned.  Based on my personal review, I approved the simulation verification. The patient's treatment will proceed as planned.  ------------------------------------------------  Jodelle Gross, MD, PhD

## 2016-07-11 ENCOUNTER — Telehealth: Payer: Self-pay | Admitting: *Deleted

## 2016-07-11 NOTE — Telephone Encounter (Signed)
Faxed compression sleeve prescription to [facility]- [phone]

## 2016-07-17 ENCOUNTER — Encounter: Payer: Self-pay | Admitting: Hematology and Oncology

## 2016-07-27 NOTE — Progress Notes (Addendum)
          Allison Mcclure is here for a one month follow up appointment for right breast cancer.  Skin status:Right breast with mild redness. What lotion are you using? Using vitamin E oil to right breast. Have you seen med onc? If not, when is appointment? 06-12-16 Dr. Lindi Adie next 10-09-16 If they are ER+, have they started Al or Tamoxifen? If not, why? Plans to start this weekTamoxifen Discuss survivorship appointment.08-21-16 Have you had a mammogram scheduled? No Offer referral to [facility]. Will receive during survivorship appointment Appetite:Fine Pain:None Fatigue:None Arm mobility: Has some discomfort if she reaches. Wt Readings from Last 3 Encounters:  08/01/16 125 lb (56.7 kg)  06/16/16 121 lb 1.6 oz (54.9 kg)  06/12/16 122 lb 9.6 oz (55.6 kg)  BP (!) 101/58   Pulse 79   Temp 98.3 F (36.8 C) (Oral)   Resp 18   Ht 5\' 2"  (1.575 m)   Wt 125 lb (56.7 kg)   LMP 07/31/2016   SpO2 100%   BMI 22.86 kg/m

## 2016-08-01 ENCOUNTER — Encounter: Payer: Self-pay | Admitting: Radiation Oncology

## 2016-08-01 ENCOUNTER — Ambulatory Visit
Admission: RE | Admit: 2016-08-01 | Discharge: 2016-08-01 | Disposition: A | Discharge status: Home / Self Care | Payer: Managed Care, Other (non HMO) | Source: Ambulatory Visit | Attending: Radiation Oncology | Admitting: Radiation Oncology

## 2016-08-01 VITALS — BP 101/58 | HR 79 | Temp 98.3°F | Resp 18 | Ht 62.0 in | Wt 125.0 lb

## 2016-08-01 DIAGNOSIS — C50911 Malignant neoplasm of unspecified site of right female breast: Secondary | ICD-10-CM | POA: Diagnosis not present

## 2016-08-01 DIAGNOSIS — Z9889 Other specified postprocedural states: Secondary | ICD-10-CM | POA: Diagnosis not present

## 2016-08-01 DIAGNOSIS — C50411 Malignant neoplasm of upper-outer quadrant of right female breast: Secondary | ICD-10-CM

## 2016-08-01 DIAGNOSIS — Z170 Estrogen receptor positive status [ER+]: Secondary | ICD-10-CM | POA: Diagnosis not present

## 2016-08-03 ENCOUNTER — Other Ambulatory Visit: Payer: Self-pay | Admitting: Radiation Oncology

## 2016-08-03 DIAGNOSIS — Z170 Estrogen receptor positive status [ER+]: Principal | ICD-10-CM

## 2016-08-03 DIAGNOSIS — C50411 Malignant neoplasm of upper-outer quadrant of right female breast: Secondary | ICD-10-CM

## 2016-08-03 NOTE — Progress Notes (Signed)
Radiation Oncology         [phone] ________________________________  Name: Allison Mcclure MRN: [id number]  Date: 08/01/2016  DOB: 11-15-1975  Post Treatment Note  CC: Tawanna Solo, MD  Stark Klein, MD  Diagnosis:  Stage IA, T1bN0, ER/PR positive invasive ductal carcinoma of the right breast.  Interval Since Last Radiation:  7 weeks   05/02/16-06/16/16 1.  Right breast/ 50.4 Gy in 28 fx 2.  Boost / 10 Gy in 5 fx  Narrative:  The patient returns today for routine follow-up. During treatment she did very well with radiotherapy and did not have significant desquamation. She did have trouble with pain due to a postop hematoma as well as a systemic rash and required a prednisone taper with hydroxyzine for pruritis.                         On review of systems, the patient states she is doing well. The patient reports she had another skin eruption and determined this was due to the use of sunscreen. She required another steroid dosepak. She denies any concerns at this point with her skin. She is concerned about developing lymphedema and requests suggestions on what to do. She reports some discomfort with extending her right arm cephalad. She still feels that she has intact range of motion however. No other complaints are verbalized.  ALLERGIES:  is allergic to sonafine [wound dressings].  Meds: Current Outpatient Prescriptions  Medication Sig Dispense Refill  . chlordiazePOXIDE (LIBRIUM) 10 MG capsule Take 5 mg by mouth daily.     Marland Kitchen loratadine (ALAVERT) 10 MG tablet Take 10 mg by mouth daily as needed.    Marland Kitchen omeprazole (PRILOSEC) 20 MG capsule TAKE 1 CAPUSLE BY MOUTH DAILY 30 MINS PRIOR TO FIRST MEAL AS NEEDED  1  . triamcinolone (NASACORT) 55 MCG/ACT AERO nasal inhaler Place into the nose.    Marland Kitchen ALPRAZolam (XANAX) 0.5 MG tablet Take 0.5 mg by mouth at bedtime as needed for anxiety.    . hydrOXYzine (ATARAX/VISTARIL) 10 MG tablet Take 1 tablet (10 mg total) by mouth 3 (three)  times daily as needed for itching. (Patient not taking: Reported on 08/01/2016) 30 tablet 0  . ibuprofen (ADVIL,MOTRIN) 200 MG tablet Take 200 mg by mouth every 8 (eight) hours as needed.    . Lactobacillus (PROBIOTIC ACIDOPHILUS) TABS Take 1 tablet by mouth daily as needed.    . methylPREDNISolone (MEDROL DOSEPAK) 4 MG TBPK tablet Take 6 tabs po on day 1, 5 tabs po on day 2, 4 tabs po on day 3, 3 tabs po day 4, 2 tabs po day 5, 1 tab po day 6 then stop (Patient not taking: Reported on 08/01/2016) 21 tablet 0  . tamoxifen (NOLVADEX) 20 MG tablet Take 1 tablet (20 mg total) by mouth daily. (Patient not taking: Reported on 08/01/2016) 90 tablet 3  . traMADol (ULTRAM) 50 MG tablet Take 50 mg by mouth as needed.     No current facility-administered medications for this encounter.     Physical Findings:  height is 5\' 2"  (1.575 m) and weight is 125 lb (56.7 kg). Her oral temperature is 98.3 F (36.8 C). Her blood pressure is 101/58 (abnormal) and her pulse is 79. Her respiration is 18 and oxygen saturation is 100%.  In general this is a well appearing Caucasian female in no acute distress. She's alert and oriented x4 and appropriate throughout the examination. Cardiopulmonary assessment is negative for  acute distress and she exhibits normal effort. The right  breast was examined and reveals minimal hyperpigmentation particularly along the axilla without evidence of desquamation, and minimal fullness is noted in the prior lumpectomy site consistent with her previous hematoma.   Lab Findings: Lab Results  Component Value Date   WBC 8.4 01/19/2016   HGB 14.7 01/19/2016   HCT 44.9 01/19/2016   MCV 94.5 01/19/2016   PLT 271 01/19/2016     Radiographic Findings: No results found.  Impression/Plan: 1. Stage IA, T1bN0, ER/PR positive invasive ductal carcinoma of the right breast. The patient has been doing well since completion of radiotherapy. We discussed that we would be happy to continue to  follow her as needed, but she will also continue to follow up with Dr. Lindi Adie in medical oncology. She was counseled on skin care and was encouraged to use Vitamin E Oil or Vitamin E containing lotion for the next 6 months, as well as encouraged to consider sunscreen over the upper chest wall to avoid sunburn.  2. Survivorship. The patient will meet with survivorship in December 2017. We encouraged her to attend this visit and discussed the rationale for this aspect of her care. 3. Pain with extension of her RUE, and concerns with development of lymphedema. The patient did have difficulty with postop hematoma and is concerned about tightness in her axilla as well as concerns with wanting to avoid activity that could bring on lymphedema. I will refer her to PT for further discussion and evaluation.     Carola Rhine, PAC

## 2016-08-04 ENCOUNTER — Encounter: Payer: Managed Care, Other (non HMO) | Admitting: Adult Health

## 2016-08-07 ENCOUNTER — Ambulatory Visit: Payer: Managed Care, Other (non HMO) | Attending: Radiation Oncology | Admitting: Physical Therapy

## 2016-08-07 DIAGNOSIS — Z483 Aftercare following surgery for neoplasm: Secondary | ICD-10-CM

## 2016-08-07 DIAGNOSIS — M25611 Stiffness of right shoulder, not elsewhere classified: Secondary | ICD-10-CM

## 2016-08-07 NOTE — Therapy (Signed)
[place], [place], [address] Phone: [phone]   Fax:  [phone]  Physical Therapy Evaluation  Patient Details  Name: Allison Mcclure MRN: [id number] Date of Birth: 12/17/1975 Referring Provider: Shona Simpson, PA  Encounter Date: 08/07/2016      PT End of Session - 08/07/16 2040    Visit Number 1   Number of Visits 1   PT Start Time [id number]   PT Stop Time 1433   PT Time Calculation (min) 46 min   Activity Tolerance Patient tolerated treatment well   Behavior During Therapy [facility] for tasks assessed/performed      Past Medical History:  Diagnosis Date  . Anxiety   . Breast cancer ([place])   . Cervical dysplasia   . Gastrointestinal disorder   . GERD (gastroesophageal reflux disease)   . IBS (irritable bowel syndrome)   . Ovarian cyst     Past Surgical History:  Procedure Laterality Date  . arm surgery     left  . BREAST LUMPECTOMY WITH RADIOACTIVE SEED AND SENTINEL LYMPH NODE BIOPSY Right 03/01/2016   Procedure: RIGHT BREAST LUMPECTOMY WITH RADIOACTIVE SEED AND SENTINEL LYMPH NODE BIOPSY;  Surgeon: Stark Klein, MD;  Location: [facility];  Service: General;  Laterality: Right;  . CRYOTHERAPY    . EXCISION MASS LOWER EXTREMETIES Right 03/01/2016   Procedure: EXCISION MASS RIGHT THIGH;  Surgeon: Stark Klein, MD;  Location: [facility];  Service: General;  Laterality: Right;  . MOUTH SURGERY     "gum surgery" x 2  . TONSILLECTOMY    . WISDOM TOOTH EXTRACTION      There were no vitals filed for this visit.       Subjective Assessment - 08/07/16 1355    Subjective Having trouble reaching with right arm; feels pulling and wants to know if that's normal at this point.  Also does not want to get lymphedema.   Pertinent History Right breast cancer with lumpectomy with 4 lymph nodes removed, 03/01/16; started radiation 05/02/16 and finished 06/16/16.  Had breast  hematoma that resolved in October.  Has started tamoxifen 08/02/16.  IBS with control with Prilosec, but that's not currently working, so she plans to see her doctor about it.  Reports she has issues with her SI joint and she has to "pop it all the time."  H/o left elbow fracture when in 2nd grade, with ORIF; now with scar approx. 4 inches long.   Patient Stated Goals see if things are normal with her recovery and get knowledgeable about lymphedema   Currently in Pain? No/denies            [facility] PT Assessment - 08/07/16 0001      Assessment   Medical Diagnosis right breast cancer with lumpectomy   Referring Provider Shona Simpson, PA   Onset Date/Surgical Date 03/01/16   Prior Therapy none     Precautions   Precautions Other (comment)   Precaution Comments cancer precautions     Restrictions   Weight Bearing Restrictions No     Balance Screen   Has the patient fallen in the past 6 months No   Has the patient had a decrease in activity level because of a fear of falling?  No   Is the patient reluctant to leave their home because of a fear of falling?  No     Home Social worker Private residence   Living Arrangements Alone  Type of Home Other(Comment)  townhome   Home Layout Two level     Prior Function   Level of Independence Independent   Vocation Full time employment   Vocation Requirements runs an apartment community; some deskwork and some physical activity including some heavy lifitn   Leisure walks and runs on treadmill; likes [employer] for exercise.; used 2 and 5 lb. weights prior to surgery for UEs     Cognition   Overall Cognitive Status Within Functional Limits for tasks assessed     ROM / Strength   AROM / PROM / Strength AROM;Strength     AROM   AROM Assessment Site Shoulder   Right/Left Shoulder Right;Left   Right Shoulder Flexion 157 Degrees   Right Shoulder ABduction 171 Degrees   Right Shoulder Internal Rotation --   WFL   Right  Shoulder External Rotation 90 Degrees   Left Shoulder Flexion 168 Degrees   Left Shoulder ABduction 186 Degrees   Left Shoulder Internal Rotation --  [facility]   Left Shoulder External Rotation 90 Degrees     Strength   Overall Strength Comments right shoulder grossly 4+/5, left grossly 5-/5     Ambulation/Gait   Ambulation/Gait Yes           LYMPHEDEMA/ONCOLOGY QUESTIONNAIRE - 08/07/16 1401      Type   Cancer Type right breast      Surgeries   Lumpectomy Date 03/01/16   Number Lymph Nodes Removed 4     Treatment   Past Radiation Treatment Yes   Date 06/16/16     Lymphedema Assessments   Lymphedema Assessments Upper extremities     Right Upper Extremity Lymphedema   10 cm Proximal to Olecranon Process 25.8 cm   Olecranon Process 21.8 cm   10 cm Proximal to Ulnar Styloid Process 21 cm   Just Proximal to Ulnar Styloid Process 14.5 cm   Across Hand at [employer] 18 cm   At [place] of 2nd Digit 5.4 cm     Left Upper Extremity Lymphedema   10 cm Proximal to Olecranon Process 25.2 cm   Olecranon Process 21 cm   10 cm Proximal to Ulnar Styloid Process 20.7 cm   Just Proximal to Ulnar Styloid Process 14.2 cm   Across Hand at [employer] 17.5 cm   At [place] of 2nd Digit 5.3 cm                        PT Education - 08/07/16 2040    Education provided Yes   Education Details about ABC class; about strength ABC program   Person(s) Educated Patient   Methods Explanation;Handout   Comprehension Verbalized understanding                [facility] Goals - 08/07/16 2050      CC Long Term Goal  #1   Title Pt. will be knowledgeable about topics covered in ABC class.   Time 1   Period Days   Status Achieved     CC Long Term Goal  #2   Title Patient will have information about strength after breast cancer program   Time 1   Period Days   Status Achieved            Plan - 08/07/16 2041    Clinical Impression Statement Patient  comes in today wanting to know if her feeling of pulling when reaching with right arm  is normal for someone at this stage of breast cancer treatment; also wants to avoid lymphedema if at all possible.  Her shoulder AROM if functional, but right is 10-15 degrees behind left currently.  There is no sign of axillary web syndrome nor particular soft tissue tightness.  Her strength is slightly decreased from full on the right.  She would do well to come to ABC class, where she will likely get the information she is looking for.   Rehab Potential Good   PT Frequency One time visit   ABC class   PT Treatment/Interventions Patient/family education   PT Next Visit Plan No follow-up at this time; patient knows to call if she finds she is not improved after a couple more months.  Suggested she attend ABC class for info on lymphedema and exercise.   Consulted and Agree with Plan of Care Patient      Patient will benefit from skilled therapeutic intervention in order to improve the following deficits and impairments:  Decreased range of motion, Pain, Decreased strength, Impaired flexibility  Visit Diagnosis: Stiffness of right shoulder, not elsewhere classified - Plan: PT plan of care cert/re-cert  Aftercare following surgery for neoplasm - Plan: PT plan of care cert/re-cert     Problem List Patient Active Problem List   Diagnosis Date Noted  . Dermatitis 06/08/2016  . Breast cancer of upper-outer quadrant of right female breast ([place]) 01/06/2016  . IBS (irritable bowel syndrome) 04/23/2013  . Chronic diarrhea 10/01/2012  . Anxiety 10/01/2012    Connee Ikner 08/07/2016, 8:54 PM  [place], [place], [address] Phone: [phone]   Fax:  [phone]  Name: Allison Mcclure MRN: [id number] Date of Birth: 1975/11/11  Serafina Royals, PT 08/07/16 8:55 PM

## 2016-08-18 ENCOUNTER — Telehealth: Payer: Self-pay | Admitting: Physician Assistant

## 2016-08-18 ENCOUNTER — Telehealth: Payer: Self-pay | Admitting: *Deleted

## 2016-08-18 NOTE — Telephone Encounter (Signed)
Pt called with c/o pain/discomfort under her lumpectomy scar and to the side of the scar. Informed pt to call Dr. Marlowe Aschoff office to discuss discomfort. Gave [facility] number to call.

## 2016-08-21 ENCOUNTER — Ambulatory Visit (HOSPITAL_BASED_OUTPATIENT_CLINIC_OR_DEPARTMENT_OTHER): Payer: Managed Care, Other (non HMO) | Admitting: Adult Health

## 2016-08-21 ENCOUNTER — Encounter: Payer: Self-pay | Admitting: Adult Health

## 2016-08-21 ENCOUNTER — Other Ambulatory Visit (HOSPITAL_COMMUNITY): Payer: Self-pay | Admitting: General Surgery

## 2016-08-21 VITALS — BP 111/69 | HR 93 | Temp 99.0°F | Resp 18 | Wt 123.0 lb

## 2016-08-21 DIAGNOSIS — C50411 Malignant neoplasm of upper-outer quadrant of right female breast: Secondary | ICD-10-CM

## 2016-08-21 DIAGNOSIS — F419 Anxiety disorder, unspecified: Secondary | ICD-10-CM | POA: Diagnosis not present

## 2016-08-21 DIAGNOSIS — Z170 Estrogen receptor positive status [ER+]: Secondary | ICD-10-CM | POA: Diagnosis not present

## 2016-08-21 DIAGNOSIS — R52 Pain, unspecified: Secondary | ICD-10-CM

## 2016-08-21 MED ORDER — ALPRAZOLAM 0.5 MG PO TABS: 0.5000 mg | ORAL_TABLET | Freq: Two times a day (BID) | ORAL | 0 refills | Status: DC | PRN

## 2016-08-21 NOTE — Telephone Encounter (Signed)
Patient having IBS and constipation.  She will come in and see Dr. Hilarie Fredrickson on 08/25/16 3:00

## 2016-08-21 NOTE — Progress Notes (Signed)
CLINIC:  Survivorship   REASON FOR VISIT:  Routine follow-up post-treatment for a recent history of breast cancer.  BRIEF ONCOLOGIC HISTORY:    Breast cancer of upper-outer quadrant of right female breast ([place])   01/03/2016 Initial Diagnosis    Right breast biopsy: Invasive ductal carcinoma with LVI, grade 2,ER 100%, PR 100%, Ki-67 10%, HER-2 negative ratio 1.23      01/03/2016 Mammogram    screening detected asymmetry in the right breast ultrasound measured 8 mm at 10:00 position axillary normal, T1b N0 stage IA      03/01/2016 Surgery    Right lumpectomy [facility]): IDC grade 1, 0.7 cm, margins negative, 0/4 lymph nodes negative, ER 100%, PR 100%, Ki-67 10%, HER-2 negative ratio 1.32, T1 BN 0 stage IA, Oncotype DX 8, 6% ROR      03/01/2016 Oncotype testing    Recurrence score: 8; ROR 6% (low-risk)      05/02/2016 - 06/16/2016 Radiation Therapy    Adjuvant radiation therapy [name]). Right breast/ 50.4 Gy in 28 fx.  Boost / 10 Gy in 5 fx      07/2016 -  Anti-estrogen oral therapy    Tamoxifen 20 mg daily. Planned duration of therapy: 5-10 years        INTERVAL HISTORY:  Ms. Fellman presents to the [facility] today for our initial meeting to review her survivorship care plan detailing her treatment course for breast cancer, as well as monitoring long-term side effects of that treatment, education regarding health maintenance, screening, and overall wellness and health promotion.     She completed her radiation therapy approximately 2.5 months ago.  Last Sunday (08/13/16), she noticed worsening right breast pain, particularly to the lateral aspect of her breast near her previous lumpectomy scar. She states it is uncomfortable to the lateral right chest wall and radiates a bit to her back. She tells me that she call Dr. Marlowe Aschoff office on Friday, and is waiting for a return call. She is concerned that she may have a seroma in her breast.  She started the tamoxifen in mid  November 2017. She tells me "My emotions have been all over the place."  She reports feeling very tearful, angry at times, and with worsening anxiety. She has a long-standing history of anxiety, for which she currently takes Librium 5 mg daily. She also intermittently takes 0.25-0.5 mg Xanax as needed. The Xanax is more effective at managing her anxiety; the Librium was started by a psychiatrist in the past as they were "working on getting me off of the Xanax."   She tells me that she has taken several anti-depressants in the past, with Lexapro being the most recent drug.  It was effective at the time, but tells me she does not want to be back on any additional anti-depressant medications at this time, "if I can help it" due to concerns about side effects.   Ms. Sarr has had a chronic left leg mass since childhood. It was reportedly worked up many years ago with MRI and she was told it was a benign fatty tumor; "They told me I didn't need to do anything about it unless it started bothering me."  She tells me that it has started to bother her more in recent weeks, particularly with wearing high heel shoes.  She would like my recommendations on what to do/what specialist she should see.    She is looking forward to an upcoming trip to [place] with her boyfriend.  REVIEW OF SYSTEMS:  Review of Systems  HENT:  Negative.   Eyes: Negative.   Respiratory: Negative.   Cardiovascular: Negative.   Gastrointestinal:       Bowel changes since starting Tamoxifen  Endocrine: Positive for hot flashes.       Periodic hot flashes, but they are manageable.   Genitourinary: Negative.    Musculoskeletal: Negative.   Skin: Negative.   Neurological: Negative.   Hematological: Negative.   Psychiatric/Behavioral: Positive for depression and sleep disturbance. The patient is nervous/anxious.   Breast: per HPI    A 14-point review of systems was completed and was negative, except as noted  above.   ONCOLOGY TREATMENT TEAM:  1. Surgeon:  Dr. Barry Dienes at [facility] 2. Medical Oncologist: Dr. Lindi Adie 3. Radiation Oncologist: Dr. [name]    PAST MEDICAL/SURGICAL HISTORY:  Past Medical History:  Diagnosis Date  . Anxiety   . Breast cancer ([place])   . Cervical dysplasia   . Gastrointestinal disorder   . GERD (gastroesophageal reflux disease)   . IBS (irritable bowel syndrome)   . Ovarian cyst    Past Surgical History:  Procedure Laterality Date  . arm surgery     left  . BREAST LUMPECTOMY WITH RADIOACTIVE SEED AND SENTINEL LYMPH NODE BIOPSY Right 03/01/2016   Procedure: RIGHT BREAST LUMPECTOMY WITH RADIOACTIVE SEED AND SENTINEL LYMPH NODE BIOPSY;  Surgeon: Stark Klein, MD;  Location: [facility];  Service: General;  Laterality: Right;  . CRYOTHERAPY    . EXCISION MASS LOWER EXTREMETIES Right 03/01/2016   Procedure: EXCISION MASS RIGHT THIGH;  Surgeon: Stark Klein, MD;  Location: [facility];  Service: General;  Laterality: Right;  . MOUTH SURGERY     "gum surgery" x 2  . TONSILLECTOMY    . WISDOM TOOTH EXTRACTION       ALLERGIES:  Allergies  Allergen Reactions  . Sonafine [Wound Dressings] Rash     CURRENT MEDICATIONS:  Outpatient Encounter Prescriptions as of 08/21/2016  Medication Sig Note  . ALPRAZolam (XANAX) 0.5 MG tablet Take 0.5 mg by mouth at bedtime as needed for anxiety.   . chlordiazePOXIDE (LIBRIUM) 10 MG capsule Take 5 mg by mouth daily.    Marland Kitchen etonogestrel-ethinyl estradiol (NUVARING) 0.12-0.015 MG/24HR vaginal ring Place 1 each vaginally every 28 (twenty-eight) days. Insert vaginally and leave in place for 3 consecutive weeks, then remove for 1 week.   . hydrOXYzine (ATARAX/VISTARIL) 10 MG tablet Take 1 tablet (10 mg total) by mouth 3 (three) times daily as needed for itching. (Patient not taking: Reported on 08/07/2016)   . ibuprofen (ADVIL,MOTRIN) 200 MG tablet Take 200 mg by mouth every 8 (eight) hours as  needed.   . Lactobacillus (PROBIOTIC ACIDOPHILUS) TABS Take 1 tablet by mouth daily as needed.   . loratadine (ALAVERT) 10 MG tablet Take 10 mg by mouth daily as needed.   . methylPREDNISolone (MEDROL DOSEPAK) 4 MG TBPK tablet Take 6 tabs po on day 1, 5 tabs po on day 2, 4 tabs po on day 3, 3 tabs po day 4, 2 tabs po day 5, 1 tab po day 6 then stop (Patient not taking: Reported on 08/07/2016)   . omeprazole (PRILOSEC) 20 MG capsule TAKE 1 CAPUSLE BY MOUTH DAILY 30 MINS PRIOR TO FIRST MEAL AS NEEDED 10/11/2015: Received from: External Pharmacy  . tamoxifen (NOLVADEX) 20 MG tablet Take 1 tablet (20 mg total) by mouth daily.   . traMADol (ULTRAM) 50 MG tablet Take 50 mg  by mouth as needed. 06/06/2016: Received from: External Pharmacy  . triamcinolone (NASACORT) 55 MCG/ACT AERO nasal inhaler Place into the nose. 01/19/2016: Received from: [facility]   No facility-administered encounter medications on file as of 08/21/2016.      ONCOLOGIC FAMILY HISTORY:  Family History  Problem Relation Age of Onset  . Hyperlipidemia Father   . Prostate cancer Father   . Hypertension Father   . Hypertension Mother   . Hyperlipidemia Mother   . Colon cancer Paternal Grandfather      GENETIC COUNSELING/TESTING: None.   SOCIAL HISTORY:  ANGELEENA DUEITT is divorced and lives in [place], [place].  She does not have any children.  She currently works full-time.  She denies any tobacco or illicit drug use. She drinks alcohol regularly.    PHYSICAL EXAMINATION:  Vital Signs:   Vitals:   08/21/16 0904  BP: 111/69  Pulse: 93  Resp: 18  Temp: 99 F (37.2 C)   Filed Weights   08/21/16 0904  Weight: 123 lb (55.8 kg)   General: Well-nourished, well-appearing female in no acute distress.  She is unaccompanied today.  HEENT: Head is normocephalic.  Pupils equal and reactive to light. Conjunctivae clear without exudate.  Sclerae anicteric. Oral mucosa is pink, moist.  Oropharynx is  pink without lesions or erythema.  Lymph: No cervical, supraclavicular, or infraclavicular lymphadenopathy noted on palpation.  Cardiovascular: Regular rate and rhythm.Marland Kitchen Respiratory: Clear to auscultation bilaterally. Chest expansion symmetric; breathing non-labored.  Breast Exam:  -Left breast: No appreciable masses. No axillary lymphadenopathy.  -Right breast: Diffuse breast edema, particularly to UOQ and lateral breast.  There is palpable scar tissue vs ? Small seroma near lumpectomy site, but it is difficult to assess given patient's severe tenderness to palpation.  Lateral chest wall near axilla also very tender to palpation without rash or appreciable edema.  No axillary lymphadenopathy.  GI: Abdomen soft and round; non-tender, non-distended. Bowel sounds normoactive.  GU: Deferred.  Neuro/MSK: No focal deficits. Steady gait. (L) femur soft tissue mass, measuring about 4 x 5 cm.  Psych: Anxious mood; Tearful during much of today's visit.  Extremities: No edema. Skin: Warm and dry.  LABORATORY DATA:  None for this visit.  DIAGNOSTIC IMAGING:  None for this visit.      ASSESSMENT AND PLAN:  Ms.. Tatro is a pleasant 40 y.o. female with Stage IA right breast invasive ductal carcinoma, ER+/PR+/HER2-, diagnosed in 12/2015; treated with lumpectomy, adjuvant radiation therapy, and anti-estrogen therapy with Tamoxifen beginning in 07/2016.  She presents to the Survivorship Clinic for our initial meeting and routine follow-up post-completion of treatment for breast cancer.    1. Stage IA right breast cancer:  Ms. Raimondi is continuing to recover from definitive treatment for breast cancer. She will follow-up with her medical oncologist, Dr. Lindi Adie in 09/2016 with history and physical exam per surveillance protocol.  She will continue her anti-estrogen therapy with Tamoxifen. Though the incidence is low, there is an associated risk of endometrial cancer with anti-estrogen therapies like  Tamoxifen.  Ms. Jenning was encouraged to contact Dr. Lindi Adie or myself with any abnormal vaginal bleeding while taking Tamoxifen. Other side effects of Tamoxifen were again reviewed with her as well. Today, a comprehensive survivorship care plan and treatment summary was reviewed with the patient today detailing her breast cancer diagnosis, treatment course, potential late/long-term effects of treatment, appropriate follow-up care with recommendations for the future, and patient education resources.  A copy of this summary,  along with a letter will be sent to the patient's primary care provider via mail/fax/In Basket message after today's visit.    2. Right breast pain: I was able to speak directly with Dr. Barry Dienes following today's visit with the patient. I shared with Dr. Barry Dienes that on exam today, the breast is quite swollen. It was difficult to palpate any appreciable fluid collection. 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 Dr. Barry Dienes plans to see her this afternoon at 3 PM for further management and recommendations, which will include referral to PT for lymphedema management.   3. Emotional lability; Adjustment disorder with anxious mood: It is not uncommon for this period of the patient's cancer care trajectory to be one of many emotions and stressors.  Certainly, the Tamoxifen and her history of anxiety are contributing to her emotional lability.  We discussed that it is not uncommon for women to need anti-depressant therapy to better help manage anxiety and associated depression after completing cancer treatments.  Generally, Lexapro and Celexa are the best SSRI options for patients on Tamoxifen due to mild CYP2D6 inhibition with these medications.  Of course, Effexor XR would be a great SNRI choice for her as well, to help manage mood and hot flashes. Effexor is also a mild inhibitor of CYP2D6, making it safe to take with Tamoxifen.  At this time, Ms. Worth does not want to resume any SSRI/SNRI therapy.  She prefers to maintain her Librium and Xanax for now.  I did refill the Xanax prescription for her today (Xanax 0.5 mg po BIDprn, #50, no refills).  I encouraged her to let us know if she changes her mind about more long-term management of her mood disturbance.  Encouraged her to follow-up with her PCP regarding the Librium (as this would not be my first choice for long-term anxiety management; she does not wish to see the psychiatrist she saw in the past).  Aside from medications, she has seen a therapist in the past. I encouraged her to reach back out to her therapist to see if they may be able to provide some additional support for her during this time as well.  I shared with her that we also have free counseling as an option to her, if she would like.  We discussed an opportunity for her to participate in the next session of [facility] ("Finding Your New Normal") support group series designed for patients after they have completed treatment.   Ms. Pohlmann was encouraged to take advantage of our many other support services programs, support groups, and/or counseling in coping with her new life as a cancer survivor after completing anti-cancer treatment.  She was offered support today through active listening and expressive supportive counseling.  She was given information regarding our available services and encouraged to contact me with any questions or for help enrolling in any of our support group/programs.   4. Left leg mass: Ms. Tersigni has had a reportedly left femur/soft tissue fatty mass since childhood.  It is beginning to bother her; "It doesn't hurt, but it's annoying."  She is considering having it further evaluated.  I recommended that she see an orthopedic or sports medicine specialist, as they will likely need to establish care and do repeat imaging to re-assess the mass.  She is not sure if she wants to have surgery, but  wants to talk to someone about her options.  I think starting with an orthopedic/sports medicine specialist would  be a reasonable place to start for an initial opinion, work-up, and treatment recommendations.   5. Bone health:  Given Ms. Delancey history of breast cancer, she is at risk for bone demineralization. We do not have records of DEXA scan.  We discussed that Tamoxifen often offers the positive side effect of increased bone density when compared to aromatase inhibitors. Therefore, I will defer any future DEXA scan imaging to her medical oncologist or PCP, as clinically indicated.  In the meantime, she was encouraged to increase her consumption of foods rich in calcium, as well as increase her weight-bearing activities.  She was given education on specific activities to promote bone health.  6. Cancer screening:  Due to Ms. Fritts history and her age, she should receive screening for skin cancers, colon cancer, and gynecologic cancers.  The information and recommendations are listed on the patient's comprehensive care plan/treatment summary and were reviewed in detail with the patient.    7. Health maintenance and wellness promotion: Ms. Stamour was encouraged to consume 5-7 servings of fruits and vegetables per day. We reviewed the "Nutrition Rainbow" handout, as well as the handout "Take Control of Your Health and Reduce Your Cancer Risk" from the [place].  She was also encouraged to engage in moderate to vigorous exercise for 30 minutes per day most days of the week. We discussed the [employer] fitness program, which is designed for cancer survivors to help them become more physically fit after cancer treatments.  She was instructed to limit her alcohol consumption and continue to abstain from tobacco use.       Dispo:   -Return to cancer center to see Dr. Lindi Adie in 09/2016 -She is welcome to return back to the Survivorship Clinic at any time; no additional  follow-up needed at this time.  -Consider referral back to survivorship as a long-term survivor for continued surveillance   A total of 60 minutes of face-to-face time was spent with this patient with greater than 50% of that time in counseling and care-coordination.   Mike Craze, NP Survivorship Program [place] [phone]   Note: PRIMARY CARE PROVIDER Tawanna Solo, [place] [phone]

## 2016-08-22 ENCOUNTER — Ambulatory Visit (HOSPITAL_COMMUNITY)
Admission: RE | Admit: 2016-08-22 | Discharge: 2016-08-22 | Disposition: A | Discharge status: Home / Self Care | Payer: Managed Care, Other (non HMO) | Source: Ambulatory Visit | Attending: General Surgery | Admitting: General Surgery

## 2016-08-22 DIAGNOSIS — M79605 Pain in left leg: Secondary | ICD-10-CM | POA: Insufficient documentation

## 2016-08-22 DIAGNOSIS — M7989 Other specified soft tissue disorders: Secondary | ICD-10-CM | POA: Insufficient documentation

## 2016-08-22 DIAGNOSIS — R52 Pain, unspecified: Secondary | ICD-10-CM

## 2016-08-22 NOTE — Progress Notes (Signed)
VASCULAR LAB PRELIMINARY  PRELIMINARY  PRELIMINARY  PRELIMINARY  Bilateral lower extremity venous duplex completed.    Preliminary report:  Bilateral:  No evidence of DVT, superficial thrombosis, or Baker's Cyst.  Chandrika Sandles, RVS 08/22/2016, 12:04 PM

## 2016-08-23 ENCOUNTER — Telehealth: Payer: Self-pay | Admitting: *Deleted

## 2016-08-23 ENCOUNTER — Ambulatory Visit: Payer: Managed Care, Other (non HMO) | Attending: General Surgery | Admitting: Physical Therapy

## 2016-08-23 DIAGNOSIS — Z483 Aftercare following surgery for neoplasm: Secondary | ICD-10-CM | POA: Insufficient documentation

## 2016-08-23 DIAGNOSIS — I890 Lymphedema, not elsewhere classified: Secondary | ICD-10-CM | POA: Diagnosis not present

## 2016-08-23 DIAGNOSIS — M25611 Stiffness of right shoulder, not elsewhere classified: Secondary | ICD-10-CM | POA: Insufficient documentation

## 2016-08-23 DIAGNOSIS — M25511 Pain in right shoulder: Secondary | ICD-10-CM | POA: Diagnosis present

## 2016-08-23 NOTE — Therapy (Signed)
[place], [place], [address] Phone: [phone]   Fax:  [phone]  Physical Therapy Evaluation  Patient Details  Name: Allison Mcclure MRN: [id number] Date of Birth: 06-Aug-1976 Referring Provider: Stark Klein  Encounter Date: 08/23/2016      PT End of Session - 08/23/16 1455    Visit Number 1   Number of Visits 7   Date for PT Re-Evaluation 09/22/16   PT Start Time [id number]   PT Stop Time 1351   PT Time Calculation (min) 43 min   Activity Tolerance Patient tolerated treatment well   Behavior During Therapy [facility] for tasks assessed/performed      Past Medical History:  Diagnosis Date  . Anxiety   . Breast cancer ([place])   . Cervical dysplasia   . Gastrointestinal disorder   . GERD (gastroesophageal reflux disease)   . IBS (irritable bowel syndrome)   . Ovarian cyst     Past Surgical History:  Procedure Laterality Date  . arm surgery     left  . BREAST LUMPECTOMY WITH RADIOACTIVE SEED AND SENTINEL LYMPH NODE BIOPSY Right 03/01/2016   Procedure: RIGHT BREAST LUMPECTOMY WITH RADIOACTIVE SEED AND SENTINEL LYMPH NODE BIOPSY;  Surgeon: Stark Klein, MD;  Location: [place];  Service: General;  Laterality: Right;  . CRYOTHERAPY    . EXCISION MASS LOWER EXTREMETIES Right 03/01/2016   Procedure: EXCISION MASS RIGHT THIGH;  Surgeon: Stark Klein, MD;  Location: [place];  Service: General;  Laterality: Right;  . MOUTH SURGERY     "gum surgery" x 2  . TONSILLECTOMY    . WISDOM TOOTH EXTRACTION      There were no vitals filed for this visit.       Subjective Assessment - 08/23/16 1310    Subjective right breast pain started two Sundays ago; also having left leg swelling and pain, and feeling like muscles are tight in her gluteal area   Pertinent History Right breast cancer with lumpectomy with 4 lymph nodes removed, 03/01/16; started radiation 05/02/16 and  finished 06/16/16.  Had breast hematoma that resolved in October.  Has started tamoxifen 08/02/16.  IBS with control with Prilosec, but that's not currently working, so she plans to see her doctor about it.  Reports she has issues with her SI joint and she has to "pop it all the time."  H/o left elbow fracture when in 2nd grade, with ORIF; now with scar approx. 4 inches long.   Patient Stated Goals get out of pain   Currently in Pain? Yes   Pain Score 0-No pain   Pain Location Breast  and right flank   Pain Orientation Right   Pain Descriptors / Indicators Other (Comment);Discomfort;Aching  annoying   Aggravating Factors  bumping right side/back, certain movements, lying on left side   Pain Relieving Factors rest, sleep   Multiple Pain Sites Yes   Pain Score 5   Pain Location Leg  and buttocks   Pain Orientation Left;Upper   Pain Descriptors / Indicators Heaviness  annoying   Aggravating Factors  wearing high heels, sitting a long time in desk chair   Pain Relieving Factors nothing            [facility] PT Assessment - 08/23/16 0001      Assessment   Medical Diagnosis right breast cancer with lumpectomy   Referring Provider Stark Klein   Onset Date/Surgical Date 03/01/16   Prior Therapy none  Precautions   Precaution Comments cancer precautions     Restrictions   Weight Bearing Restrictions No     Balance Screen   Has the patient fallen in the past 6 months Yes  up steps at sister's house   How many times? 1   Has the patient had a decrease in activity level because of a fear of falling?  No   Is the patient reluctant to leave their home because of a fear of falling?  No     Home Environment   Living Environment Private residence   Living Arrangements Alone   Type of Home Other(Comment)  townhome   Home Layout Two level     Prior Function   Level of Independence Independent   Vocation Full time employment   Vocation Requirements runs an apartment community; some  deskwork and some physical activity including some heavy lifitn   Leisure walks and runs on treadmill; likes [employer] for exercise.; used 2 and 5 lb. weights prior to surgery for UEs     Cognition   Overall Cognitive Status Within Functional Limits for tasks assessed     Observation/Other Assessments   Observations right breast appears larger than left     Palpation   Palpation comment right breast feels indurated compared to left, particularly at lateral aspect           LYMPHEDEMA/ONCOLOGY QUESTIONNAIRE - 08/23/16 1325      Right Upper Extremity Lymphedema   Other at base of breast, 41.1     Left Upper Extremity Lymphedema   Other at base of breast                [facility] Adult PT Treatment/Exercise - 08/23/16 0001      Self-Care   Self-Care Other Self-Care Comments   Other Self-Care Comments  fashioned compression foam garment with two 1/4" layers of gray foam and a smaller rectangle of large dot peach Medi foam, all in stockinette.  Instructed patient to wear this between bra and skin (preferably sports bra with compression), with dots on the firmest part of her outer breast, to see if that would reduce swelling.     Manual Therapy   Manual Therapy Manual Lymphatic Drainage (MLD)   Manual Lymphatic Drainage (MLD) In supine with head of bed slightly elevated:  short neck, superficial and deep abdomen, left axilla and anterior interaxillary anastomosis, right groin and axillo-inguinal anastomosis, and right breast, directing towards pathways.  Instructed patient in basics of principles and techniques of doing this.                PT Education - 08/23/16 1454    Education provided Yes   Education Details began instruction in principles of manual lymph drainage; also about using and trimming foam pad, if needed, for right breast swelling   Person(s) Educated Patient   Methods Explanation;Demonstration   Comprehension Need further instruction                 Long Term Clinic Goals - 08/23/16 1505      CC Long Term Goal  #1   Title Patient will be knowledgeable about self-care for right breast lymphedema including manual lymph drainage and use of compression.   Time 3   Period Weeks   Status New     CC Long Term Goal  #2   Title Patient will report at least 50% decrease in discomfort in right breast/flank area   Time 3  Period Weeks            Plan - 08/23/16 1456    Clinical Impression Statement Patient returns today with new referral, having been here just a couple of weeks ago for post-treatment assessment, in which it was determined she did not need therapy.  However, shortly after that, she developed discomfort and swelling in the affected breast (right).  Observation shows the right breast to look larger than the left; palpation reveals induration of right breast, particularly at lateral aspect.  Measurement of breast swelling is difficulty, but a measurement taken today did show a larger measurement right than left.  Patient also reports left leg pain, but was referred today only for the breast lymphedema, and may be referred to an orthopedist for assessment of the leg problem.  Pt. will be going out of the country to [place] 12/13-12/18, so hopes to have some resolution of problems prior to this vacation.   Rehab Potential Good   PT Frequency 2x / week  1-2 x/week   PT Duration 3 weeks  to 4 weeks prn   PT Treatment/Interventions ADLs/Self Care Home Management;DME Instruction;Therapeutic exercise;Patient/family education;Manual techniques;Manual lymph drainage;Compression bandaging;Scar mobilization;Passive range of motion   PT Next Visit Plan Teach patient self-manual lymph drainage for right breast and issue handout; check on benefit of foam pad and use of sports bra.  Pt. to bring or wear sports bra in:  check fit and benefit of this.  Educate about lymphedema risk reduction.   Consulted and Agree with Plan of Care Patient       Patient will benefit from skilled therapeutic intervention in order to improve the following deficits and impairments:  Increased edema, Pain, Decreased knowledge of precautions, Decreased knowledge of use of DME  Visit Diagnosis: Lymphedema, not elsewhere classified - Plan: PT plan of care cert/re-cert  Acute pain of right shoulder - Plan: PT plan of care cert/re-cert     Problem List Patient Active Problem List   Diagnosis Date Noted  . Dermatitis 06/08/2016  . Breast cancer of upper-outer quadrant of right female breast ([place]) 01/06/2016  . IBS (irritable bowel syndrome) 04/23/2013  . Chronic diarrhea 10/01/2012  . Anxiety 10/01/2012    Fauna Neuner 08/23/2016, 3:09 PM  [place], [place], [address] Phone: [phone]   Fax:  [phone]  Name: Allison Mcclure MRN: [id number] Date of Birth: 1976/08/30  Serafina Royals, PT 08/23/16 3:09 PM

## 2016-08-23 NOTE — Telephone Encounter (Signed)
Called to let pt know that taking Librium will have no adverse effects on her tamoxifen. I also told pt to pick up Rx of Xanax at pharmacy. I encouraged pt if she has any issues concerning add'l anxiety problems to f/u with her PCP. Pt agreed with the plan. Message to be fwd to G.Dawson,NP.

## 2016-08-24 ENCOUNTER — Encounter: Payer: Self-pay | Admitting: *Deleted

## 2016-08-25 ENCOUNTER — Ambulatory Visit (INDEPENDENT_AMBULATORY_CARE_PROVIDER_SITE_OTHER): Payer: Managed Care, Other (non HMO) | Admitting: Internal Medicine

## 2016-08-25 ENCOUNTER — Ambulatory Visit: Payer: Managed Care, Other (non HMO) | Admitting: Physical Therapy

## 2016-08-25 ENCOUNTER — Encounter: Payer: Self-pay | Admitting: Internal Medicine

## 2016-08-25 VITALS — BP 96/70 | HR 96 | Ht 62.0 in | Wt 123.0 lb

## 2016-08-25 DIAGNOSIS — K219 Gastro-esophageal reflux disease without esophagitis: Secondary | ICD-10-CM

## 2016-08-25 DIAGNOSIS — I890 Lymphedema, not elsewhere classified: Secondary | ICD-10-CM | POA: Diagnosis not present

## 2016-08-25 DIAGNOSIS — M25611 Stiffness of right shoulder, not elsewhere classified: Secondary | ICD-10-CM

## 2016-08-25 DIAGNOSIS — K589 Irritable bowel syndrome without diarrhea: Secondary | ICD-10-CM

## 2016-08-25 DIAGNOSIS — Z483 Aftercare following surgery for neoplasm: Secondary | ICD-10-CM

## 2016-08-25 DIAGNOSIS — M25511 Pain in right shoulder: Secondary | ICD-10-CM

## 2016-08-25 NOTE — Therapy (Signed)
[name], [place], [address] Phone: [phone]   Fax:  [phone]  Physical Therapy Treatment  Patient Details  Name: Allison Mcclure MRN: [id number] Date of Birth: 08/14/1976 Referring Provider: Stark Klein  Encounter Date: 08/25/2016      PT End of Session - 08/25/16 1032    Visit Number 2   Number of Visits 7   Date for PT Re-Evaluation 09/22/16   PT Start Time [id number]  pt arrived late   PT Stop Time 0930   PT Time Calculation (min) 35 min   Activity Tolerance Patient tolerated treatment well   Behavior During Therapy [name] for tasks assessed/performed      Past Medical History:  Diagnosis Date  . Anxiety   . Breast cancer ([place])   . Cervical dysplasia   . Gastrointestinal disorder   . GERD (gastroesophageal reflux disease)   . GERD (gastroesophageal reflux disease)   . IBS (irritable bowel syndrome)   . Ovarian cyst     Past Surgical History:  Procedure Laterality Date  . arm surgery     left  . BREAST LUMPECTOMY WITH RADIOACTIVE SEED AND SENTINEL LYMPH NODE BIOPSY Right 03/01/2016   Procedure: RIGHT BREAST LUMPECTOMY WITH RADIOACTIVE SEED AND SENTINEL LYMPH NODE BIOPSY;  Surgeon: Stark Klein, MD;  Location: [place];  Service: General;  Laterality: Right;  . CRYOTHERAPY    . EXCISION MASS LOWER EXTREMETIES Right 03/01/2016   Procedure: EXCISION MASS RIGHT THIGH;  Surgeon: Stark Klein, MD;  Location: [place];  Service: General;  Laterality: Right;  . MOUTH SURGERY     "gum surgery" x 2  . TONSILLECTOMY    . WISDOM TOOTH EXTRACTION      There were no vitals filed for this visit.      Subjective Assessment - 08/25/16 1033    Subjective Pt states she has been using the foam patch every day .  She says she is upset all this is happening to her    Pertinent History Right breast cancer with lumpectomy with 4 lymph nodes removed, 03/01/16; started  radiation 05/02/16 and finished 06/16/16.  Had breast hematoma that resolved in October.  Has started tamoxifen 08/02/16.  IBS with control with Prilosec, but that's not currently working, so she plans to see her doctor about it.  Reports she has issues with her SI joint and she has to "pop it all the time."  H/o left elbow fracture when in 2nd grade, with ORIF; now with scar approx. 4 inches long.   Patient Stated Goals get out of pain   Currently in Pain? --  did not rate                          [facility] Adult PT Treatment/Exercise - 08/25/16 0001      Self-Care   Other Self-Care Comments  gave pt small dotted foam for less aggressive compression now that induration has decreased      Shoulder Exercises: Sidelying   ABduction AROM;Right;5 reps  with deep breath and stretch at the top    Other Sidelying Exercises small circles with hand pointed toward ceiling      Manual Therapy   Manual Therapy Manual Lymphatic Drainage (MLD)   Manual Lymphatic Drainage (MLD) In supine with head of bed slightly elevated:  short neck, superficial and deep abdomen, left axilla and anterior interaxillary anastomosis, right groin and axillo-inguinal anastomosis,  and right breast, directing towards pathways.  Then to sidelying for lateral breast and anastamosis.  Verbal and hand over hand instruction while performing.                 PT Education - 08/25/16 1037    Education provided Yes   Education Details self manual lymph draiange, written instructions and [name] DVD issued    Person(s) Educated Patient   Methods Explanation;Demonstration;Handout   Comprehension Verbalized understanding;Need further instruction                Long Term Clinic Goals - 08/23/16 1505      CC Long Term Goal  #1   Title Patient will be knowledgeable about self-care for right breast lymphedema including manual lymph drainage and use of compression.   Time 3   Period Weeks   Status New      CC Long Term Goal  #2   Title Patient will report at least 50% decrease in discomfort in right breast/flank area   Time 3   Period Weeks            Plan - 08/25/16 1038    Clinical Impression Statement Right breast is softer with much less induration so updated foam compression to wear in bra.  Pt able to do some of manual lymph drainage, but will need reinforcement. Encouraged swimming and deep water walking as well as shoudler range of motion  Also gave information about second to nature for compression bra    Rehab Potential Good   Clinical Impairments Affecting Rehab Potential previous radiation    PT Frequency 2x / week   PT Duration 3 weeks   PT Treatment/Interventions ADLs/Self Care Home Management;DME Instruction;Therapeutic exercise;Patient/family education;Manual techniques;Manual lymph drainage;Compression bandaging;Scar mobilization;Passive range of motion   PT Next Visit Plan Review self-manual lymph drainage for right breast  check on benefit of foam pad and use of sports bra.   Educate about lymphedema risk reduction.      Patient will benefit from skilled therapeutic intervention in order to improve the following deficits and impairments:  Increased edema, Pain, Decreased knowledge of precautions, Decreased knowledge of use of DME  Visit Diagnosis: Lymphedema, not elsewhere classified  Acute pain of right shoulder  Stiffness of right shoulder, not elsewhere classified  Aftercare following surgery for neoplasm     Problem List Patient Active Problem List   Diagnosis Date Noted  . Dermatitis 06/08/2016  . Breast cancer of upper-outer quadrant of right female breast ([place]) 01/06/2016  . IBS (irritable bowel syndrome) 04/23/2013  . Chronic diarrhea 10/01/2012  . Anxiety 10/01/2012   [name] PT  [name] 08/25/2016, 10:43 AM  [place], [place],  [address] Phone: [phone]   Fax:  [phone]  Name: Allison Mcclure MRN: [id number] Date of Birth: 1975/10/12

## 2016-08-25 NOTE — Progress Notes (Signed)
Subjective:    Patient ID: Allison Mcclure, female    DOB: Jan 24, 1976, 40 y.o.   MRN: [id number]  HPI Allison Mcclure is a 40 year old female with a history of IBS, previously diarrhea predominant, GERD, breast cancer diagnosed this year now status post lumpectomy and lymph node dissection and radiation therapy currently on tamoxifen who is seen for follow-up. She was seen in July 2017 by Ellouise Newer, PA-C. Initially when I saw her years ago she was having diarrhea predominant IBS. She had an unremarkable colonoscopy and was going to start Lotronex. This was never started. Eventually she developed reflux disease and was started on PPI. PPI significant early helped her IBS symptoms including the diarrhea. She took Prilosec for several years and was restarted on this summer when she presented with indigestion. She took it twice a day for 2 or 3 months and symptoms have improved dramatically. She has avoided NSAIDs and feels that she had gastritis or either an ulcer.  Interestingly she was treated with prednisone on 2 occasions for rash in the last 6 months. Since being treated with steroids her indigestion and upper abdominal pain have completely resolved. This is strange for her and she has not needed Prilosec. Now Prilosec after being treated with steroid seems to cause bloating so she is not using it.  She reports regular bowel movements and even occasional constipation. No blood in stool or melena. No trouble swallowing. No hepatobiliary complaint.  She has had issues with depressed mood and anxiety this year particularly with the breast cancer diagnosis. Mood seems to be improving. She is going to [place] with her serious boyfriend and expects to likely become engaged soon.  Review of Systems  as per history of present illness, otherwise negative  Current Medications, Allergies, Past Medical History, Past Surgical History, Family History and Social History were reviewed in  [employer] record.     Objective:   Physical Exam BP 96/70   Pulse 96   Ht 5\' 2"  (1.575 m)   Wt 123 lb (55.8 kg)   LMP 07/31/2016   BMI 22.50 kg/m  Constitutional: Well-developed and well-nourished. No distress. HEENT: Normocephalic and atraumatic.  Conjunctivae are normal.  No scleral icterus. Neck: Neck supple. Trachea midline. Cardiovascular: Normal rate, regular rhythm and intact distal pulses. No M/R/G Pulmonary/chest: Effort normal and breath sounds normal. No wheezing, rales or rhonchi. Abdominal: Soft, nontender, nondistended. Bowel sounds active throughout. There are no masses palpable. No hepatosplenomegaly. Extremities: no clubbing, cyanosis, or edema Neurological: Alert and oriented to person place and time. Skin: Skin is warm and dry. Psychiatric: Normal mood and affect. Behavior is normal.  CBC    Component Value Date/Time   WBC 8.4 01/19/2016 1227   WBC 7.1 10/11/2015 1059   RBC 4.75 01/19/2016 1227   RBC 4.89 10/11/2015 1059   HGB 14.7 01/19/2016 1227   HCT 44.9 01/19/2016 1227   PLT 271 01/19/2016 1227   MCV 94.5 01/19/2016 1227   MCH 31.0 01/19/2016 1227   MCHC 32.8 01/19/2016 1227   MCHC 33.5 10/11/2015 1059   RDW 12.7 01/19/2016 1227   LYMPHSABS 1.5 01/19/2016 1227   MONOABS 0.5 01/19/2016 1227   EOSABS 0.1 01/19/2016 1227   BASOSABS 0.1 01/19/2016 1227   CMP     Component Value Date/Time   NA 140 01/19/2016 1227   K 3.7 01/19/2016 1227   CO2 23 01/19/2016 1227   GLUCOSE 99 01/19/2016 1227   BUN 15.1 01/19/2016 1227  CREATININE 0.9 01/19/2016 1227   CALCIUM 9.2 01/19/2016 1227   PROT 6.6 01/19/2016 1227   ALBUMIN 3.7 01/19/2016 1227   AST 13 01/19/2016 1227   ALT 12 01/19/2016 1227   ALKPHOS 55 01/19/2016 1227   BILITOT 0.36 01/19/2016 1227      Assessment & Plan:   40 year old female with a history of IBS, previously diarrhea predominant, GERD, breast cancer diagnosed this year now status post lumpectomy and  lymph node dissection and radiation therapy currently on tamoxifen who is seen for follow-up.  1. GERD/indigestion -- Not a big issue at present. I recommended ranitidine 150 mg twice a day when necessary for heartburn symptoms. Unclear why steroids helped her indigestion type symptoms and we even discussed steroids are often risk factors for ulcer disease. She was not known to have a lymphocytic or eosinophilic enteritis and so it remains a mystery to me as to why the symptoms of indigestion seem to improve with steroid therapy. She will remain off PPI for now  2. IBS -- previously diarrhea predominant. No recent diarrhea. IBS well controlled currently. Occasional constipation can be treated with MiraLAX 17 g as needed.  3. CRC screening -- to be given age 49. Normal colonoscopy at age 48  Followup as needed 25 minutes spent with the patient today. Greater than 50% was spent in counseling and coordination of care with the patient

## 2016-08-25 NOTE — Patient Instructions (Addendum)
Please follow up as needed  Please purchase the following medications over the counter and take as directed: Zantac 150 mg twice daily as needed. Remain off of your proton pump inhibitor  If you are age 41 or older, your body mass index should be between 23-30. Your Body mass index is 22.5 kg/m. If this is out of the aforementioned range listed, please consider follow up with your Primary Care Provider.  If you are age 25 or younger, your body mass index should be between 19-25. Your Body mass index is 22.5 kg/m. If this is out of the aformentioned range listed, please consider follow up with your Primary Care Provider.

## 2016-08-29 ENCOUNTER — Ambulatory Visit: Payer: Managed Care, Other (non HMO)

## 2016-08-29 DIAGNOSIS — I890 Lymphedema, not elsewhere classified: Secondary | ICD-10-CM | POA: Diagnosis not present

## 2016-08-29 DIAGNOSIS — M25611 Stiffness of right shoulder, not elsewhere classified: Secondary | ICD-10-CM

## 2016-08-29 DIAGNOSIS — Z483 Aftercare following surgery for neoplasm: Secondary | ICD-10-CM

## 2016-08-29 DIAGNOSIS — M25511 Pain in right shoulder: Secondary | ICD-10-CM

## 2016-08-29 NOTE — Therapy (Signed)
[place], [place], [address] Phone: [phone]   Fax:  [phone]  Physical Therapy Treatment  Patient Details  Name: Allison Mcclure MRN: [id number] Date of Birth: 1976/09/01 Referring Provider: Stark Mcclure  Encounter Date: 08/29/2016      PT End of Session - 08/29/16 1027    Visit Number 3   Number of Visits 7   Date for PT Re-Evaluation 09/22/16   PT Start Time 0938   PT Stop Time 1027   PT Time Calculation (min) 49 min   Activity Tolerance Patient tolerated treatment well   Behavior During Therapy [facility] for tasks assessed/performed      Past Medical History:  Diagnosis Date  . Anxiety   . Breast cancer ([place])   . Cervical dysplasia   . Gastrointestinal disorder   . GERD (gastroesophageal reflux disease)   . GERD (gastroesophageal reflux disease)   . IBS (irritable bowel syndrome)   . Ovarian cyst     Past Surgical History:  Procedure Laterality Date  . arm surgery     left  . BREAST LUMPECTOMY WITH RADIOACTIVE SEED AND SENTINEL LYMPH NODE BIOPSY Right 03/01/2016   Procedure: RIGHT BREAST LUMPECTOMY WITH RADIOACTIVE SEED AND SENTINEL LYMPH NODE BIOPSY;  Surgeon: Allison Klein, MD;  Location: [place];  Service: General;  Laterality: Right;  . CRYOTHERAPY    . EXCISION MASS LOWER EXTREMETIES Right 03/01/2016   Procedure: EXCISION MASS RIGHT THIGH;  Surgeon: Allison Klein, MD;  Location: [place];  Service: General;  Laterality: Right;  . MOUTH SURGERY     "gum surgery" x 2  . TONSILLECTOMY    . WISDOM TOOTH EXTRACTION      There were no vitals filed for this visit.      Subjective Assessment - 08/29/16 0943    Subjective My Rt breast was swollen Saturday and I did the massage and it felt a little better.    Pertinent History Right breast cancer with lumpectomy with 4 lymph nodes removed, 03/01/16; started radiation 05/02/16 and finished 06/16/16.   Had breast hematoma that resolved in October.  Has started tamoxifen 08/02/16.  IBS with control with Prilosec, but that's not currently working, so she plans to see her doctor about it.  Reports she has issues with her SI joint and she has to "pop it all the time."  H/o left elbow fracture when in 2nd grade, with ORIF; now with scar approx. 4 inches long.   Patient Stated Goals get out of pain   Currently in Pain? No/denies                         [facility] Adult PT Treatment/Exercise - 08/29/16 0001      Manual Therapy   Manual Therapy Manual Lymphatic Drainage (MLD)   Manual Lymphatic Drainage (MLD) In supine:  short neck, superficial and deep abdomen, left axilla and anterior interaxillary anastomosis, right groin and axillo-inguinal anastomosis, and right breast, directing towards pathways.  Then to sidelying for lateral breast and anastamosis..                PT Education - 08/29/16 1024    Education provided Yes   Education Details Reviewed manual lymph drainage while performing   Person(s) Educated Patient   Methods Explanation;Demonstration   Comprehension Verbalized understanding                [facility]  Goals - 08/23/16 1505      CC Long Term Goal  #1   Title Patient will be knowledgeable about self-care for right breast lymphedema including manual lymph drainage and use of compression.   Time 3   Period Weeks   Status New     CC Long Term Goal  #2   Title Patient will report at least 50% decrease in discomfort in right breast/flank area   Time 3   Period Weeks            Plan - 08/29/16 1029    Clinical Impression Statement Pt did well with self manual lymph drainage 1x over weekend and had softening of fluid after but then felt it came back next day. So educated pt on importance of staying consistent with MLD daily and especially after her upcoming flight tomorrow. Pt seems to have a good understanding of technique and  sequnce. She has not been able to  go to [facility] [place] and doesn't think she'll be able to go until after she gets back from [place].    Rehab Potential Good   Clinical Impairments Affecting Rehab Potential previous radiation    PT Frequency 2x / week   PT Duration 3 weeks   PT Treatment/Interventions ADLs/Self Care Home Management;DME Instruction;Therapeutic exercise;Patient/family education;Manual techniques;Manual lymph drainage;Compression bandaging;Scar mobilization;Passive range of motion   PT Next Visit Plan Review self-manual lymph drainage for right breast  check on benefit of foam pad and use of sports bra.   Educate about lymphedema risk reduction.   Consulted and Agree with Plan of Care Patient      Patient will benefit from skilled therapeutic intervention in order to improve the following deficits and impairments:  Increased edema, Pain, Decreased knowledge of precautions, Decreased knowledge of use of DME  Visit Diagnosis: Lymphedema, not elsewhere classified  Acute pain of right shoulder  Stiffness of right shoulder, not elsewhere classified  Aftercare following surgery for neoplasm     Problem List Patient Active Problem List   Diagnosis Date Noted  . Dermatitis 06/08/2016  . Breast cancer of upper-outer quadrant of right female breast ([place]) 01/06/2016  . IBS (irritable bowel syndrome) 04/23/2013  . Chronic diarrhea 10/01/2012  . Anxiety 10/01/2012    [name], PTA 08/29/2016, 10:33 AM  [place], [place], [address] Phone: [phone]   Fax:  [phone]  Name: Allison Mcclure MRN: [id number] Date of Birth: November 01, 1975

## 2016-09-05 ENCOUNTER — Ambulatory Visit: Payer: Managed Care, Other (non HMO)

## 2016-09-05 DIAGNOSIS — I890 Lymphedema, not elsewhere classified: Secondary | ICD-10-CM

## 2016-09-05 NOTE — Therapy (Signed)
[place], [place], [address] Phone: [phone]   Fax:  [phone]  Physical Therapy Treatment  Patient Details  Name: Allison Mcclure MRN: [id number] Date of Birth: [birth year] Referring Provider: Stark Klein  Encounter Date: 09/05/2016      PT End of Session - 09/05/16 1020    Visit Number 4   Number of Visits 7   Date for PT Re-Evaluation 09/22/16   PT Start Time 0939   PT Stop Time 1020   PT Time Calculation (min) 41 min   Activity Tolerance Patient tolerated treatment well   Behavior During Therapy [facility] for tasks assessed/performed      Past Medical History:  Diagnosis Date  . Anxiety   . Breast cancer ([place])   . Cervical dysplasia   . Gastrointestinal disorder   . GERD (gastroesophageal reflux disease)   . GERD (gastroesophageal reflux disease)   . IBS (irritable bowel syndrome)   . Ovarian cyst     Past Surgical History:  Procedure Laterality Date  . arm surgery     left  . BREAST LUMPECTOMY WITH RADIOACTIVE SEED AND SENTINEL LYMPH NODE BIOPSY Right 03/01/2016   Procedure: RIGHT BREAST LUMPECTOMY WITH RADIOACTIVE SEED AND SENTINEL LYMPH NODE BIOPSY;  Surgeon: Stark Klein, MD;  Location: [place];  Service: General;  Laterality: Right;  . CRYOTHERAPY    . EXCISION MASS LOWER EXTREMETIES Right 03/01/2016   Procedure: EXCISION MASS RIGHT THIGH;  Surgeon: Stark Klein, MD;  Location: [place];  Service: General;  Laterality: Right;  . MOUTH SURGERY     "gum surgery" x 2  . TONSILLECTOMY    . WISDOM TOOTH EXTRACTION      There were no vitals filed for this visit.      Subjective Assessment - 09/05/16 0940    Subjective My Rt breast did okay during my trip. It got hard and hurt at times but when I could I would do the manual lymph drainage and it gave me some relief.      Pertinent History Right breast cancer with lumpectomy with 4 lymph  nodes removed, 03/01/16; started radiation 05/02/16 and finished 06/16/16.  Had breast hematoma that resolved in October.  Has started tamoxifen 08/02/16.  IBS with control with Prilosec, but that's not currently working, so she plans to see her doctor about it.  Reports she has issues with her SI joint and she has to "pop it all the time."  H/o left elbow fracture when in 2nd grade, with ORIF; now with scar approx. 4 inches long.   Patient Stated Goals get out of pain   Currently in Pain? No/denies                         [facility] Adult PT Treatment/Exercise - 09/05/16 0001      Manual Therapy   Manual Therapy Manual Lymphatic Drainage (MLD)   Manual Lymphatic Drainage (MLD) In supine:  short neck, superficial and deep abdomen, left axilla and anterior interaxillary anastomosis, right groin and axillo-inguinal anastomosis, and right breast, directing towards pathways.  Then to sidelying for lateral breast and anastamosis.Marland Kitchen Pts boyfrined present so instructed him as well today in this.                 PT Education - 09/05/16 1019    Education provided Yes   Education Details Instructed pts boyfriend in manual lymph drainage  Person(s) Educated Patient;Spouse  Boyfriend Karle Starch   Methods Explanation;Demonstration   Comprehension Verbalized understanding;Returned demonstration                [facility] Goals - 08/23/16 1505      CC Long Term Goal  #1   Title Patient will be knowledgeable about self-care for right breast lymphedema including manual lymph drainage and use of compression.   Time 3   Period Weeks   Status New     CC Long Term Goal  #2   Title Patient will report at least 50% decrease in discomfort in right breast/flank area   Time 3   Period Weeks            Plan - 09/05/16 1021    Clinical Impression Statement Pts boyfriend present today and did excellent with instruction and return demonstration of manual lymph drainage. She  reports having increased pain at inferior breast prior to treatment as she just returned from [place] late last night, but reported area feeling osfter and less tender after session. Pt to look into calling [facility] for a compression bra.    Rehab Potential Good   Clinical Impairments Affecting Rehab Potential previous radiation    PT Frequency 2x / week   PT Duration 3 weeks   PT Treatment/Interventions ADLs/Self Care Home Management;DME Instruction;Therapeutic exercise;Patient/family education;Manual techniques;Manual lymph drainage;Compression bandaging;Scar mobilization;Passive range of motion   PT Next Visit Plan Review self-manual lymph drainage for right breast  check on benefit of foam pad and use of sports bra.   Educate about lymphedema risk reduction.   Consulted and Agree with Plan of Care Patient      Patient will benefit from skilled therapeutic intervention in order to improve the following deficits and impairments:  Increased edema, Pain, Decreased knowledge of precautions, Decreased knowledge of use of DME  Visit Diagnosis: Lymphedema, not elsewhere classified     Problem List Patient Active Problem List   Diagnosis Date Noted  . Dermatitis 06/08/2016  . Breast cancer of upper-outer quadrant of right female breast ([place]) 01/06/2016  . IBS (irritable bowel syndrome) 04/23/2013  . Chronic diarrhea 10/01/2012  . Anxiety 10/01/2012    [name], PTA 09/05/2016, 10:23 AM  [place], [place], [address] Phone: [phone]   Fax:  [phone]  Name: SILVA SCHOPP MRN: [id number] Date of Birth: 07/10/1976

## 2016-09-06 ENCOUNTER — Ambulatory Visit: Payer: Managed Care, Other (non HMO) | Admitting: Physical Therapy

## 2016-09-07 ENCOUNTER — Ambulatory Visit: Payer: Managed Care, Other (non HMO)

## 2016-09-07 DIAGNOSIS — I890 Lymphedema, not elsewhere classified: Secondary | ICD-10-CM | POA: Diagnosis not present

## 2016-09-07 DIAGNOSIS — M25511 Pain in right shoulder: Secondary | ICD-10-CM

## 2016-09-07 DIAGNOSIS — Z483 Aftercare following surgery for neoplasm: Secondary | ICD-10-CM

## 2016-09-07 NOTE — Therapy (Signed)
[place], [place], [address] Phone: [phone]   Fax:  [phone]  Physical Therapy Treatment  Patient Details  Name: Allison Mcclure MRN: [id number] Date of Birth: Jan 10, 1976 Referring Provider: Stark Klein  Encounter Date: 09/07/2016      PT End of Session - 09/07/16 1024    Visit Number 5   Number of Visits 7   Date for PT Re-Evaluation 09/22/16   PT Start Time 0938   PT Stop Time 1023   PT Time Calculation (min) 45 min   Activity Tolerance Patient tolerated treatment well   Behavior During Therapy [facility] for tasks assessed/performed      Past Medical History:  Diagnosis Date  . Anxiety   . Breast cancer ([place])   . Cervical dysplasia   . Gastrointestinal disorder   . GERD (gastroesophageal reflux disease)   . GERD (gastroesophageal reflux disease)   . IBS (irritable bowel syndrome)   . Ovarian cyst     Past Surgical History:  Procedure Laterality Date  . arm surgery     left  . BREAST LUMPECTOMY WITH RADIOACTIVE SEED AND SENTINEL LYMPH NODE BIOPSY Right 03/01/2016   Procedure: RIGHT BREAST LUMPECTOMY WITH RADIOACTIVE SEED AND SENTINEL LYMPH NODE BIOPSY;  Surgeon: Stark Klein, MD;  Location: [place];  Service: General;  Laterality: Right;  . CRYOTHERAPY    . EXCISION MASS LOWER EXTREMETIES Right 03/01/2016   Procedure: EXCISION MASS RIGHT THIGH;  Surgeon: Stark Klein, MD;  Location: [place];  Service: General;  Laterality: Right;  . MOUTH SURGERY     "gum surgery" x 2  . TONSILLECTOMY    . WISDOM TOOTH EXTRACTION      There were no vitals filed for this visit.      Subjective Assessment - 09/07/16 0944    Subjective My Rt breast is just killing me today. It felt good after last session but then started hurting more yesterday and my breast was really firm and I did the self MLD yesterday and got a little relief but it's back to bothering me  again this morning alot. I am just really getting frustrated with this hurting along with everything else. I do have an appt with my GP soon about my leg pain that I think is from sciatica.    Pertinent History Right breast cancer with lumpectomy with 4 lymph nodes removed, 03/01/16; started radiation 05/02/16 and finished 06/16/16.  Had breast hematoma that resolved in October.  Has started tamoxifen 08/02/16.  IBS with control with Prilosec, but that's not currently working, so she plans to see her doctor about it.  Reports she has issues with her SI joint and she has to "pop it all the time."  H/o left elbow fracture when in 2nd grade, with ORIF; now with scar approx. 4 inches long.   Patient Stated Goals get out of pain   Currently in Pain? Yes   Pain Score 6    Pain Location Breast  and right flank   Pain Orientation Right   Pain Descriptors / Indicators Aching;Heaviness  Pulling   Pain Type Surgical pain   Pain Onset 1 to 4 weeks ago   Aggravating Factors  just since the trip it's been worse   Pain Relieving Factors self MLD                         [facility] Adult PT Treatment/Exercise -  09/07/16 0001      Manual Therapy   Manual Therapy Manual Lymphatic Drainage (MLD)   Manual Lymphatic Drainage (MLD) In supine:  short neck, superficial and deep abdomen, left axilla and anterior interaxillary anastomosis, right groin and axillo-inguinal anastomosis, and right breast, directing towards pathways.  Then to sidelying for lateral breast and anastamosis.Marland Kitchen Pts boyfrined present so instructed him as well today in this.    Kinesiotex Edema     Kinesiotix   Edema Placed "bucket" near Rt hip and ran 3 fingers along Rt axillo-inguinal anastomosis to include lateral breast and flank to help direct fluid to Rt inguinal nodes.                        Long Term Clinic Goals - 08/23/16 1505      CC Long Term Goal  #1   Title Patient will be knowledgeable about self-care  for right breast lymphedema including manual lymph drainage and use of compression.   Time 3   Period Weeks   Status New     CC Long Term Goal  #2   Title Patient will report at least 50% decrease in discomfort in right breast/flank area   Time 3   Period Weeks            Plan - 09/07/16 1028    Clinical Impression Statement Pt reports having good relief after last session but since then her fibrosis has worsened causing increase pain at her Rt lateral breast and around trunk. She reports feeling good relief after session today. Also did initial trial of kinesiotape to her Rt lateral flank which she reports felt good after applied. Encouraged pt that it's normal to have some increased edema  after the weekend she had of flying, and then ziplining and riding an ATV which both required repetitive UE use she doesn't nromally do. Pt verbalized understanding this and is just frustrated with her lymphedema. She has an appt to get measured for a compression bra at [facility] [employer].    Rehab Potential Good   Clinical Impairments Affecting Rehab Potential previous radiation    PT Frequency 2x / week   PT Duration 3 weeks   PT Treatment/Interventions ADLs/Self Care Home Management;DME Instruction;Therapeutic exercise;Patient/family education;Manual techniques;Manual lymph drainage;Compression bandaging;Scar mobilization;Passive range of motion   PT Next Visit Plan Review self-manual lymph drainage for right breast, assess kinesiotape and possibly reapply. See if pt got a compression bra and possbily assess fit prn. Educate about lymphedema risk reduction.   Recommended Other Services Pt getting measured for a compression bra tomorrow, 09/08/16   Consulted and Agree with Plan of Care Patient      Patient will benefit from skilled therapeutic intervention in order to improve the following deficits and impairments:  Increased edema, Pain, Decreased knowledge of precautions, Decreased  knowledge of use of DME  Visit Diagnosis: Lymphedema, not elsewhere classified  Acute pain of right shoulder  Aftercare following surgery for neoplasm     Problem List Patient Active Problem List   Diagnosis Date Noted  . Dermatitis 06/08/2016  . Breast cancer of upper-outer quadrant of right female breast ([place]) 01/06/2016  . IBS (irritable bowel syndrome) 04/23/2013  . Chronic diarrhea 10/01/2012  . Anxiety 10/01/2012    [name], PTA 09/07/2016, 10:37 AM  [place], [place], [address] Phone: [phone]   Fax:  [phone]  Name: Allison Mcclure MRN:  [id number] Date of Birth: [birth year]

## 2016-09-12 ENCOUNTER — Ambulatory Visit: Payer: Managed Care, Other (non HMO)

## 2016-09-13 ENCOUNTER — Ambulatory Visit: Payer: Managed Care, Other (non HMO)

## 2016-09-13 DIAGNOSIS — I890 Lymphedema, not elsewhere classified: Secondary | ICD-10-CM | POA: Diagnosis not present

## 2016-09-13 DIAGNOSIS — M25511 Pain in right shoulder: Secondary | ICD-10-CM

## 2016-09-13 DIAGNOSIS — Z483 Aftercare following surgery for neoplasm: Secondary | ICD-10-CM

## 2016-09-13 NOTE — Therapy (Signed)
[place], [place], [address] Phone: [phone]   Fax:  [phone]  Physical Therapy Treatment  Patient Details  Name: Allison Mcclure MRN: [id number] Date of Birth: [birth year] Referring Provider: Stark Klein  Encounter Date: 09/13/2016      PT End of Session - 09/13/16 1121    Visit Number 6   Number of Visits 7   Date for PT Re-Evaluation 09/22/16   PT Start Time 1021   PT Stop Time 1117   PT Time Calculation (min) 56 min   Activity Tolerance Patient tolerated treatment well   Behavior During Therapy [facility] for tasks assessed/performed      Past Medical History:  Diagnosis Date  . Anxiety   . Breast cancer ([place])   . Cervical dysplasia   . Gastrointestinal disorder   . GERD (gastroesophageal reflux disease)   . GERD (gastroesophageal reflux disease)   . IBS (irritable bowel syndrome)   . Ovarian cyst     Past Surgical History:  Procedure Laterality Date  . arm surgery     left  . BREAST LUMPECTOMY WITH RADIOACTIVE SEED AND SENTINEL LYMPH NODE BIOPSY Right 03/01/2016   Procedure: RIGHT BREAST LUMPECTOMY WITH RADIOACTIVE SEED AND SENTINEL LYMPH NODE BIOPSY;  Surgeon: Stark Klein, MD;  Location: [place];  Service: General;  Laterality: Right;  . CRYOTHERAPY    . EXCISION MASS LOWER EXTREMETIES Right 03/01/2016   Procedure: EXCISION MASS RIGHT THIGH;  Surgeon: Stark Klein, MD;  Location: [place];  Service: General;  Laterality: Right;  . MOUTH SURGERY     "gum surgery" x 2  . TONSILLECTOMY    . WISDOM TOOTH EXTRACTION      There were no vitals filed for this visit.      Subjective Assessment - 09/13/16 1024    Subjective I got a compression bra and it's okay but I still don't think it's compressive enough. I went to the doctor about my leg and they can't tell me why it's swelling but htey want me to start PT next door for sciatica. I loved the  tape but it really messed with my skin so don't want to do it again.    Pertinent History Right breast cancer with lumpectomy with 4 lymph nodes removed, 03/01/16; started radiation 05/02/16 and finished 06/16/16.  Had breast hematoma that resolved in October.  Has started tamoxifen 08/02/16.  IBS with control with Prilosec, but that's not currently working, so she plans to see her doctor about it.  Reports she has issues with her SI joint and she has to "pop it all the time."  H/o left elbow fracture when in 2nd grade, with ORIF; now with scar approx. 4 inches long.   Patient Stated Goals get out of pain   Currently in Pain? Yes   Pain Score 6    Pain Location Breast   Pain Orientation Right   Pain Descriptors / Indicators Sore;Aching   Pain Type Surgical pain   Pain Radiating Towards axilla   Pain Onset 1 to 4 weeks ago   Aggravating Factors  just always hurts   Pain Relieving Factors MLD really helps               LYMPHEDEMA/ONCOLOGY QUESTIONNAIRE - 09/13/16 1108      Right Upper Extremity Lymphedema   10 cm Proximal to Olecranon Process 25 cm   Olecranon Process 21.4 cm   10 cm Proximal to Ulnar  Styloid Process 20.7 cm   Just Proximal to Ulnar Styloid Process 14 cm   Across Hand at [employer] 18 cm   At [place] of 2nd Digit 5.4 cm   Other at base of breast,                   [facility] Adult PT Treatment/Exercise - 09/13/16 0001      Manual Therapy   Manual Therapy Manual Lymphatic Drainage (MLD)   Manual Lymphatic Drainage (MLD) In supine:  short neck, superficial and deep abdomen, left axilla and anterior interaxillary anastomosis, right groin and axillo-inguinal anastomosis, and right breast, directing towards pathways.  Then to sidelying for lateral breast and anastamosis.Marland Kitchen Pts boyfrined present so instructed him as well today in this.                         [facility] [place] [facility] [place] [facility] Goals - 09/13/16 1114      CC Long Term Goal  #1   Title Patient  will be knowledgeable about self-care for right breast lymphedema including manual lymph drainage and use of compression.   Status Partially Met     CC Long Term Goal  #2   Title Patient will report at least 50% decrease in discomfort in right breast/flank area   Baseline Pt reports this goal was met but feels not as much progress this week as her swelling is very intermittent-09/13/16   Status Partially Met            Plan - 09/13/16 1122    Clinical Impression Statement Pt continues to demonstrated softening of tissue at lateral breast after manual lymph drainage and she reports improvement with her pain after session. Remeasured her Rt UE as pt reports she wants to make sure there's no swelling and all her measurements were unchanged or reduced, her Rt breast measurements was reduced as well. Overall pt is progressing well with independence of self care reporting performing self MLD daily but not getting as much relief at home as she gets here. She is also experiencing alot of pain still at lateral breast where she reports hematoma was and pain/tender to touch at Rt posteriolateral trunk. She did get a compression bra and this appears to be a good fit.    Rehab Potential Good   Clinical Impairments Affecting Rehab Potential previous radiation    PT Frequency 2x / week   PT Duration 3 weeks   PT Treatment/Interventions ADLs/Self Care Home Management;DME Instruction;Therapeutic exercise;Patient/family education;Manual techniques;Manual lymph drainage;Compression bandaging;Scar mobilization;Passive range of motion   PT Next Visit Plan Review self-manual lymph drainage for right breast. Educate about lymphedema risk reduction/issue handout.   Recommended Other Services Sent inbox to Dr. Lindi Adie at pts request to update him about the pain she is still experiencing.    Consulted and Agree with Plan of Care Patient      Patient will benefit from skilled therapeutic intervention in order to  improve the following deficits and impairments:  Increased edema, Pain, Decreased knowledge of precautions, Decreased knowledge of use of DME  Visit Diagnosis: Lymphedema, not elsewhere classified  Aftercare following surgery for neoplasm  Acute pain of right shoulder     Problem List Patient Active Problem List   Diagnosis Date Noted  . Dermatitis 06/08/2016  . Breast cancer of upper-outer quadrant of right female breast ([place]) 01/06/2016  . IBS (irritable bowel syndrome) 04/23/2013  . Chronic diarrhea 10/01/2012  . Anxiety 10/01/2012  Mcclure, Allison Ann, PTA 09/13/2016, 11:29 AM  [facility] [address] [place], [place], [address] Phone: [phone]   Fax:  [phone]  Name: Allison Mcclure MRN: [id number] Date of Birth: 04/23/1976    

## 2016-09-14 ENCOUNTER — Ambulatory Visit: Payer: Managed Care, Other (non HMO) | Admitting: Physical Therapy

## 2016-09-14 ENCOUNTER — Telehealth: Payer: Self-pay | Admitting: Emergency Medicine

## 2016-09-14 DIAGNOSIS — M25511 Pain in right shoulder: Secondary | ICD-10-CM

## 2016-09-14 DIAGNOSIS — I890 Lymphedema, not elsewhere classified: Secondary | ICD-10-CM | POA: Diagnosis not present

## 2016-09-14 DIAGNOSIS — Z483 Aftercare following surgery for neoplasm: Secondary | ICD-10-CM

## 2016-09-14 NOTE — Telephone Encounter (Signed)
Patient called with complaints of continued pain in her right breast. States she is continuing to go to the lymphedema clinic for rehab. Patient also states " I have something wrong with my leg"; patient scheduled to see ortho next week for this concern. Patient states she is "not well" and is feeling "frustrated". States that she may need an antidepressant. Patient requesting that she be seen by Dr Lindi Adie earlier than Jan 22. Appointment made for Jan 5 with Dr Lindi Adie. Instructed patient to call for any worsening of symptoms in the meantime.

## 2016-09-14 NOTE — Therapy (Signed)
[place], [place], [address] Phone: [phone]   Fax:  [phone]  Physical Therapy Treatment  Patient Details  Name: Allison Mcclure MRN: [id number] Date of Birth: Mar 17, 1976 Referring Provider: Stark Klein  Encounter Date: 09/14/2016      PT End of Session - 09/14/16 1505    Visit Number 7   Number of Visits 7   Date for PT Re-Evaluation 09/22/16   PT Start Time 1301   PT Stop Time 1344   PT Time Calculation (min) 43 min   Activity Tolerance Patient tolerated treatment well   Behavior During Therapy [facility] for tasks assessed/performed      Past Medical History:  Diagnosis Date  . Anxiety   . Breast cancer ([place])   . Cervical dysplasia   . Gastrointestinal disorder   . GERD (gastroesophageal reflux disease)   . GERD (gastroesophageal reflux disease)   . IBS (irritable bowel syndrome)   . Ovarian cyst     Past Surgical History:  Procedure Laterality Date  . arm surgery     left  . BREAST LUMPECTOMY WITH RADIOACTIVE SEED AND SENTINEL LYMPH NODE BIOPSY Right 03/01/2016   Procedure: RIGHT BREAST LUMPECTOMY WITH RADIOACTIVE SEED AND SENTINEL LYMPH NODE BIOPSY;  Surgeon: Stark Klein, MD;  Location: [facility];  Service: General;  Laterality: Right;  . CRYOTHERAPY    . EXCISION MASS LOWER EXTREMETIES Right 03/01/2016   Procedure: EXCISION MASS RIGHT THIGH;  Surgeon: Stark Klein, MD;  Location: [facility];  Service: General;  Laterality: Right;  . MOUTH SURGERY     "gum surgery" x 2  . TONSILLECTOMY    . WISDOM TOOTH EXTRACTION      There were no vitals filed for this visit.      Subjective Assessment - 09/14/16 1303    Subjective Was doing better until things flared up on her recent trip. Having pain at right back posterior to axilla where it feels like there is a pocket of fluid.  Breast hurts too in compression bra right now.  Reports feeling tired of  not feeling good and not getting answers about this and about her leg swelling and pain.  Has a PT appt. and plans to see Dr. Lindi Adie again.   Currently in Pain? Yes   Pain Location Back   Pain Orientation Right;Upper   Aggravating Factors  moving around hurts, taking off the bra   Pain Relieving Factors nothing               LYMPHEDEMA/ONCOLOGY QUESTIONNAIRE - 09/13/16 1108      Right Upper Extremity Lymphedema   10 cm Proximal to Olecranon Process 25 cm   Olecranon Process 21.4 cm   10 cm Proximal to Ulnar Styloid Process 20.7 cm   Just Proximal to Ulnar Styloid Process 14 cm   Across Hand at [employer] 18 cm   At [place] of 2nd Digit 5.4 cm   Other at base of breast,                   [facility] Adult PT Treatment/Exercise - 09/14/16 0001      Manual Therapy   Manual Lymphatic Drainage (MLD) In supine:  short neck, superficial and deep abdomen, left axilla and anterior interaxillary anastomosis, right groin and axillo-inguinal anastomosis, and right breast, directing towards pathways.  Then to sidelying for lateral breast and anastamosis.  [facility] Goals - 09/14/16 1511      CC Long Term Goal  #1   Title Patient will be knowledgeable about self-care for right breast lymphedema including manual lymph drainage and use of compression.   Status Achieved     CC Long Term Goal  #2   Title Patient will report at least 50% decrease in discomfort in right breast/flank area   Status Partially Met            Plan - 09/14/16 1506    Clinical Impression Statement Patient with quite exquisite sensitivity to touch at lateral aspect of right breast today; that spot has an area that feels something like cording, extending about 1.5 inches laterally at the lateral right breast, and visible and palpable with the patient in left sidelying.  She hopes to see Dr. Lindi Adie in a week to get his opinion on whether some sort of further  workup needs to be done to determine the cause of her recently increased and significant pain at right breast and extending posterior to the breast.  She will also see an orthopedic physical therapist about her left leg pain and swelling. Pt. will be on hold for this therapy until after she sees Dr. Lindi Adie; if she were to continue with [place], she will need a renewal.   Rehab Potential Good   Clinical Impairments Affecting Rehab Potential previous radiation    PT Treatment/Interventions ADLs/Self Care Home Management;DME Instruction;Therapeutic exercise;Patient/family education;Manual techniques;Manual lymph drainage;Compression bandaging;Scar mobilization;Passive range of motion   PT Next Visit Plan On hold for now.  Patient will see Dr. Lindi Adie to help determine the cause of recent pain.  She is to call us to let us know what happens from there.  If she continues therapy, she will need a renewal at that time.   Consulted and Agree with Plan of Care Patient      Patient will benefit from skilled therapeutic intervention in order to improve the following deficits and impairments:  Increased edema, Pain, Decreased knowledge of precautions, Decreased knowledge of use of DME  Visit Diagnosis: Lymphedema, not elsewhere classified  Aftercare following surgery for neoplasm  Acute pain of right shoulder     Problem List Patient Active Problem List   Diagnosis Date Noted  . Dermatitis 06/08/2016  . Breast cancer of upper-outer quadrant of right female breast ([place]) 01/06/2016  . IBS (irritable bowel syndrome) 04/23/2013  . Chronic diarrhea 10/01/2012  . Anxiety 10/01/2012    SALISBURY,DONNA 09/14/2016, 3:12 PM  [place], [place], [address] Phone: [phone]   Fax:  [phone]  Name: HUNTLEY DEMEDEIROS MRN: [id number] Date of Birth: 1976-06-02 Serafina Royals, PT 09/14/16 3:12 PM

## 2016-09-20 ENCOUNTER — Ambulatory Visit: Payer: Managed Care, Other (non HMO) | Attending: Family Medicine | Admitting: Physical Therapy

## 2016-09-20 ENCOUNTER — Encounter: Payer: Self-pay | Admitting: Physical Therapy

## 2016-09-20 DIAGNOSIS — M25552 Pain in left hip: Secondary | ICD-10-CM | POA: Insufficient documentation

## 2016-09-20 DIAGNOSIS — I890 Lymphedema, not elsewhere classified: Secondary | ICD-10-CM | POA: Diagnosis present

## 2016-09-20 DIAGNOSIS — Z483 Aftercare following surgery for neoplasm: Secondary | ICD-10-CM | POA: Insufficient documentation

## 2016-09-20 DIAGNOSIS — M25611 Stiffness of right shoulder, not elsewhere classified: Secondary | ICD-10-CM | POA: Insufficient documentation

## 2016-09-20 DIAGNOSIS — M25511 Pain in right shoulder: Secondary | ICD-10-CM | POA: Insufficient documentation

## 2016-09-20 DIAGNOSIS — M79605 Pain in left leg: Secondary | ICD-10-CM | POA: Diagnosis present

## 2016-09-20 NOTE — Therapy (Signed)
[place], [place], [address] Phone: [phone]   Fax:  [phone]  Physical Therapy Evaluation  Patient Details  Name: Allison Mcclure MRN: [id number] Date of Birth: [birth year] Referring Provider: Kathyrn Lass, MD  Encounter Date: 09/20/2016      PT End of Session - 09/20/16 1306    Visit Number 8  visit 1 for ortho   Number of Visits 16  8 visits for ortho   Date for PT Re-Evaluation 10/21/16  2/3 for ortho 1/5 for lymph   Authorization Type [employer] 60 visit limit   PT Start Time 1155   PT Stop Time 1238   PT Time Calculation (min) 43 min   Activity Tolerance Patient tolerated treatment well   Behavior During Therapy Anxious  pt very stressed about number of health issues      Past Medical History:  Diagnosis Date  . Anxiety   . Breast cancer ([place])   . Cervical dysplasia   . Gastrointestinal disorder   . GERD (gastroesophageal reflux disease)   . GERD (gastroesophageal reflux disease)   . IBS (irritable bowel syndrome)   . Ovarian cyst     Past Surgical History:  Procedure Laterality Date  . arm surgery     left  . BREAST LUMPECTOMY WITH RADIOACTIVE SEED AND SENTINEL LYMPH NODE BIOPSY Right 03/01/2016   Procedure: RIGHT BREAST LUMPECTOMY WITH RADIOACTIVE SEED AND SENTINEL LYMPH NODE BIOPSY;  Surgeon: Stark Klein, MD;  Location: [facility];  Service: General;  Laterality: Right;  . CRYOTHERAPY    . EXCISION MASS LOWER EXTREMETIES Right 03/01/2016   Procedure: EXCISION MASS RIGHT THIGH;  Surgeon: Stark Klein, MD;  Location: [facility];  Service: General;  Laterality: Right;  . MOUTH SURGERY     "gum surgery" x 2  . TONSILLECTOMY    . WISDOM TOOTH EXTRACTION      There were no vitals filed for this visit.       Subjective Assessment - 09/20/16 1157    Subjective ORTHO EVALUATION: L SIJ pain and L leg pain. Leg swelling began over the summer when  receiving radiation. Fell on Thanksgiving and hit L knee. Has always had L SIJ pain, xhusband was chiropractor and worked with it often. Is seeing oncologist on Friday and will discuss leg swelling. Always crosses L leg over R when seated due to generalized discomfort with straight plane seated.    Pertinent History Right breast cancer with lumpectomy with 4 lymph nodes removed, 03/01/16; started radiation 05/02/16 and finished 06/16/16.  Had breast hematoma that resolved in October.  Has started tamoxifen 08/02/16.  IBS with control with Prilosec, but that's not currently working, so she plans to see her doctor about it.  Reports she has issues with her SI joint and she has to "pop it all the time."  H/o left elbow fracture when in 2nd grade, with ORIF; now with scar approx. 4 inches long.   Limitations Sitting   Patient Stated Goals decrease pain   Currently in Pain? Yes   Pain Score 5    Pain Location Sacrum  SIJ   Pain Orientation Left   Pain Descriptors / Indicators Aching   Pain Type Chronic pain   Pain Radiating Towards L leg   Aggravating Factors  sitting straight   Pain Relieving Factors manipulation            [facility] PT Assessment - 09/20/16 0001  Assessment   Medical Diagnosis L SIJ and L Leg pain   Referring Provider Kathyrn Lass, MD   Hand Dominance Right   Next MD Visit not scheduled   Prior Therapy lymphedema-current     Precautions   Precaution Comments cancer precautions     Restrictions   Weight Bearing Restrictions No     Balance Screen   Has the patient fallen in the past 6 months Yes   How many times? 1   Has the patient had a decrease in activity level because of a fear of falling?  No   Is the patient reluctant to leave their home because of a fear of falling?  No     Home Ecologist residence   [place] level     Prior Function   Level of Independence Independent   Vocation Full  time employment   Vocation Requirements runs an apartment community; some deskwork and some physical activity including some heavy lifitn   Leisure walks and runs on treadmill; likes [employer] for exercise.; used 2 and 5 lb. weights prior to surgery for UEs     Cognition   Overall Cognitive Status Within Functional Limits for tasks assessed     Strength   Overall Strength Comments hip strength not measured at eval due to pelvic instability     Palpation   SI assessment  L SIJ pain   Palpation comment functional LLD (L shorter)     Ambulation/Gait   Gait Comments antalgic on L                   [facility] Adult PT Treatment/Exercise - 09/20/16 0001      Exercises   Exercises Knee/Hip     Knee/Hip Exercises: Stretches   Other Knee/Hip Stretches supine perpendicular release of hip ERs 3 wash cloths     Knee/Hip Exercises: Supine   Bridges with Ball Squeeze 10 reps   Other Supine Knee/Hip Exercises hooklying & seated iso abd/add   Other Supine Knee/Hip Exercises pelvic tilt (paired with all exercises)     Manual Therapy   Manual Therapy Joint mobilization   Joint Mobilization L long axis FA gr 4 3x30s                PT Education - 09/20/16 1303    Education provided Yes   Education Details anatomy of condition, POC, HEP, exercise form/rationale, sitting/sleeping posture   Person(s) Educated Patient   Methods Explanation;Demonstration;Tactile cues;Verbal cues;Handout   Comprehension Verbalized understanding;Returned demonstration;Verbal cues required;Tactile cues required;Need further instruction          PT Short Term Goals - 09/20/16 1323      PT SHORT TERM GOAL #1   Title Pt will be independent with HEP to improve long term stability in pelvis and SIJ by 2/2   Baseline began establishing at eval   Time 4   Period Weeks   Status New     PT SHORT TERM GOAL #2   Title Pt will verbalize improved posture during sleeping and sitting to avoid crossing legs    Baseline began educating at eval   Time 4   Period Weeks   Status New     PT SHORT TERM GOAL #3   Title Pt will verbalize average pain <=3/10 to decrease limitations to daily activities   Baseline 5/10 today but frequently higher   Time 4   Period Weeks  Status New     PT SHORT TERM GOAL #4   Title Pt will present with equal leg length indicating improved stability and reduction of functional LLD   Baseline L leg shorter at eval   Time 4   Period Weeks   Status New              Long Term Clinic Goals - 09/14/16 1511      CC Long Term Goal  #1   Title Patient will be knowledgeable about self-care for right breast lymphedema including manual lymph drainage and use of compression.   Status Achieved     CC Long Term Goal  #2   Title Patient will report at least 50% decrease in discomfort in right breast/flank area   Status Partially Met            Plan - 09/20/16 1313    Clinical Impression Statement Pt presents to PT with complaints of L SIJ pain and L SIJ pain and swelling. Pt has notbale laxity through bilateral hips, SIJ and cervical region. Reports being manipulated by chiropractors for many years but has not done exercises for stability. Pt has swelling in L leg of insidious onset that alters fitting of shoes and clothes. Pt with notbale functional LLD that was easily corrected with long axis distraction indicating excessive tightness of hip/SI surrounding musculature and laxity at hip and SIJ. Pt will benefit from skilled PT in order to improve lumbo pelvic stability.    Rehab Potential Good   PT Frequency 2x / week   PT Duration 4 weeks   PT Treatment/Interventions ADLs/Self Care Home Management;Cryotherapy;Functional mobility training;Stair training;Gait training;Moist Heat;Therapeutic activities;Therapeutic exercise;Balance training;Neuromuscular re-education;Patient/family education;Passive range of motion;Manual lymph drainage;Manual techniques;Taping   PT  Next Visit Plan lumbo pelvic stability, manual to hip musculature   PT Home Exercise Plan iso abd/add, pelvic tilt, bridge with ball squeeze   Consulted and Agree with Plan of Care Patient      Patient will benefit from skilled therapeutic intervention in order to improve the following deficits and impairments:  Increased muscle spasms, Decreased activity tolerance, Pain, Improper body mechanics, Hypermobility, Decreased strength, Increased edema  Visit Diagnosis: Pain in left hip - Plan: PT plan of care cert/re-cert  Pain in left leg - Plan: PT plan of care cert/re-cert     Problem List Patient Active Problem List   Diagnosis Date Noted  . Dermatitis 06/08/2016  . Breast cancer of upper-outer quadrant of right female breast ([place]) 01/06/2016  . IBS (irritable bowel syndrome) 04/23/2013  . Chronic diarrhea 10/01/2012  . Anxiety 10/01/2012   Sheilah Rayos C. Andres Escandon PT, DPT 09/20/16 1:29 PM   [facility] [address] [place], [place], [address] Phone: [phone]   Fax:  [phone]  Name: Allison Mcclure MRN: [id number] Date of Birth: 1976/01/21

## 2016-09-22 ENCOUNTER — Ambulatory Visit: Payer: Managed Care, Other (non HMO)

## 2016-09-22 ENCOUNTER — Encounter: Payer: Self-pay | Admitting: Hematology and Oncology

## 2016-09-22 ENCOUNTER — Ambulatory Visit (HOSPITAL_BASED_OUTPATIENT_CLINIC_OR_DEPARTMENT_OTHER): Payer: Managed Care, Other (non HMO) | Admitting: Hematology and Oncology

## 2016-09-22 DIAGNOSIS — I890 Lymphedema, not elsewhere classified: Secondary | ICD-10-CM

## 2016-09-22 DIAGNOSIS — C50411 Malignant neoplasm of upper-outer quadrant of right female breast: Secondary | ICD-10-CM

## 2016-09-22 DIAGNOSIS — Z170 Estrogen receptor positive status [ER+]: Secondary | ICD-10-CM | POA: Diagnosis not present

## 2016-09-22 MED ORDER — VENLAFAXINE HCL ER 37.5 MG PO CP24: 37.5000 mg | ORAL_CAPSULE | Freq: Every day | ORAL | 6 refills | Status: DC

## 2016-09-22 MED ORDER — ETONOGESTREL-ETHINYL ESTRADIOL 0.12-0.015 MG/24HR VA RING: 1.0000 | VAGINAL_RING | VAGINAL | 0 refills | Status: DC

## 2016-09-22 NOTE — Assessment & Plan Note (Signed)
Right lumpectomy 03/01/2016: IDC grade 1, 0.7 cm, margins negative, 0/4 lymph nodes negative, ER 100%, PR 100%, Ki-67 10%, HER-2 negative ratio 1.32, T1 BN 0 stage IA Oncotype DX score 8: 6% risk of recurrence low risk Adjuvant radiation therapy from 05/02/2016 to 06/09/2016  Treatment plan: Adjuvant antiestrogen therapy with tamoxifen 20 mg daily 5-10 years, started 07/19/2016  Tamoxifen toxicities:  Return to clinic in 6 months for follow-up 

## 2016-09-22 NOTE — Progress Notes (Signed)
Patient Care Team: Kathyrn Lass, MD as PCP - General (Family Medicine)  DIAGNOSIS:  Encounter Diagnosis  Name Primary?  . Malignant neoplasm of upper-outer quadrant of right breast in female, estrogen receptor positive ([place])     SUMMARY OF ONCOLOGIC HISTORY:   Breast cancer of upper-outer quadrant of right female breast ([place])   01/03/2016 Initial Diagnosis    Right breast biopsy: Invasive ductal carcinoma with LVI, grade 2,ER 100%, PR 100%, Ki-67 10%, HER-2 negative ratio 1.23      01/03/2016 Mammogram    screening detected asymmetry in the right breast ultrasound measured 8 mm at 10:00 position axillary normal, T1b N0 stage IA      03/01/2016 Surgery    Right lumpectomy [facility]): IDC grade 1, 0.7 cm, margins negative, 0/4 lymph nodes negative, ER 100%, PR 100%, Ki-67 10%, HER-2 negative ratio 1.32, T1 BN 0 stage IA, Oncotype DX 8, 6% ROR      03/01/2016 Oncotype testing    Recurrence score: 8; ROR 6% (low-risk)      05/02/2016 - 06/16/2016 Radiation Therapy    Adjuvant radiation therapy [name]). Right breast/ 50.4 Gy in 28 fx.  Boost / 10 Gy in 5 fx      07/2016 -  Anti-estrogen oral therapy    Tamoxifen 20 mg daily. Planned duration of therapy: 5-10 years        CHIEF COMPLIANT: Profound emotional outbursts, left leg pain, right breast discomfort  INTERVAL HISTORY: Allison Mcclure is a 41 year old with above-mentioned history of right breast cancer treated with lumpectomy and radiation and was taking tamoxifen therapy. She came in because of multiple symptoms related to the right breast. She is experiencing severe pain in the right breast. She is also undergoing physical therapy for lymphedema. She had an ultrasound in September 2017 and also on 09/21/2016. Ultrasound did not show any evidence of breast cancer. There is a complex cyst in the right axilla as well as postsurgical scar tissue. She also complains of left leg pain associated with left eye swelling. She had an  ultrasound of the leg from Dr. Barry Dienes which did not show any DVT. She thinks it is getting better slowly with time. She was very emotional and tearful and crying. She is also worried about her weight although she is fairly thin individual.  REVIEW OF SYSTEMS:   Constitutional: Denies fevers, chills or abnormal weight loss Eyes: Denies blurriness of vision Ears, nose, mouth, throat, and face: Denies mucositis or sore throat Respiratory: Denies cough, dyspnea or wheezes Cardiovascular: Denies palpitation, chest discomfort Gastrointestinal:  Denies nausea, heartburn or change in bowel habits Skin: Denies abnormal skin rashes Lymphatics: Denies new lymphadenopathy or easy bruising Neurological:Denies numbness, tingling or new weaknesses Behavioral/Psych: Mood is stable, no new changes  Extremities: No lower extremity edema Breast: Pain in the right breast, swelling in the right axilla cordlike sensation beneath the breast and in the right axilla3 All other systems were reviewed with the patient and are negative.  I have reviewed the past medical history, past surgical history, social history and family history with the patient and they are unchanged from previous note.  ALLERGIES:  is allergic to sunscreen spf30 [coppertone spf4].  MEDICATIONS:  Current Outpatient Prescriptions  Medication Sig Dispense Refill  . ALPRAZolam (XANAX) 0.5 MG tablet Take 1 tablet (0.5 mg total) by mouth 2 (two) times daily as needed for anxiety. 50 tablet 0  . chlordiazePOXIDE (LIBRIUM) 10 MG capsule Take 5 mg by mouth daily.     Marland Kitchen  etonogestrel-ethinyl estradiol (NUVARING) 0.12-0.015 MG/24HR vaginal ring Place 1 each vaginally every 28 (twenty-eight) days. Insert vaginally and leave in place for 3 consecutive weeks, then remove for 1 week.    . loratadine (ALAVERT) 10 MG tablet Take 10 mg by mouth daily as needed.    . tamoxifen (NOLVADEX) 20 MG tablet Take 1 tablet (20 mg total) by mouth daily. 90 tablet 3  .  triamcinolone (NASACORT) 55 MCG/ACT AERO nasal inhaler Place 2 sprays into the nose as needed.      No current facility-administered medications for this visit.     PHYSICAL EXAMINATION: ECOG PERFORMANCE STATUS: 1 - Symptomatic but completely ambulatory  Vitals:   09/22/16 1101  BP: 127/76  Pulse: 71  Resp: 18  Temp: 98.6 F (37 C)   Filed Weights   09/22/16 1101  Weight: 123 lb 6.4 oz (56 kg)    GENERAL:alert, no distress and comfortable SKIN: skin color, texture, turgor are normal, no rashes or significant lesions EYES: normal, Conjunctiva are pink and non-injected, sclera clear OROPHARYNX:no exudate, no erythema and lips, buccal mucosa, and tongue normal  NECK: supple, thyroid normal size, non-tender, without nodularity LYMPH:  no palpable lymphadenopathy in the cervical, axillary or inguinal LUNGS: clear to auscultation and percussion with normal breathing effort HEART: regular rate & rhythm and no murmurs and no lower extremity edema ABDOMEN:abdomen soft, non-tender and normal bowel sounds MUSCULOSKELETAL:no cyanosis of digits and no clubbing  NEURO: alert & oriented x 3 with fluent speech, no focal motor/sensory deficits EXTREMITIES: No lower extremity edema BREAST: Tenderness in the right breast and axilla. Cordlike sensation beneath the right breast. (exam performed in the presence of a chaperone)  LABORATORY DATA:  I have reviewed the data as listed   Chemistry      Component Value Date/Time   NA 140 01/19/2016 1227   K 3.7 01/19/2016 1227   CO2 23 01/19/2016 1227   BUN 15.1 01/19/2016 1227   CREATININE 0.9 01/19/2016 1227      Component Value Date/Time   CALCIUM 9.2 01/19/2016 1227   ALKPHOS 55 01/19/2016 1227   AST 13 01/19/2016 1227   ALT 12 01/19/2016 1227   BILITOT 0.36 01/19/2016 1227       Lab Results  Component Value Date   WBC 8.4 01/19/2016   HGB 14.7 01/19/2016   HCT 44.9 01/19/2016   MCV 94.5 01/19/2016   PLT 271 01/19/2016    NEUTROABS 6.2 01/19/2016    ASSESSMENT & PLAN:  Breast cancer of upper-outer quadrant of right female breast ([place]) Right lumpectomy 03/01/2016: IDC grade 1, 0.7 cm, margins negative, 0/4 lymph nodes negative, ER 100%, PR 100%, Ki-67 10%, HER-2 negative ratio 1.32, T1 BN 0 stage IA Oncotype DX score 8: 6% risk of recurrence low risk Adjuvant radiation therapy from 05/02/2016 to 06/09/2016  Treatment plan: Adjuvant antiestrogen therapy with tamoxifen 20 mg daily 5-10 years, started 07/19/2016  Tamoxifen toxicities: 1. Severe emotional outbursts: I instructed her to stop tamoxifen. I started Effexor 37.5 mg. In 3 weeks she will start 10 mg of tamoxifen. In 2 months she will start 20 mg of tamoxifen. 2. left leg pain and cramps and swelling: Ultrasound of the leg was negative for DVT. These symptoms appear to be improving slowly. I instructed her about taking calcium and magnesium supplementation to help improve the leg cramping.  Breast pain and discomfort: She had an ultrasound of the breast at [facility] yesterday. There was a oval mass in the right axilla representing  a complex cyst, postsurgical scar in the right breast upper outer quadrant is benign and a three-month follow-up mammogram and ultrasound was recommended. I discussed with her that the breast pain could be related to surgical scar tissue and radiation related discomfort. She is very sensitive to touch. I instructed her to apply vitamin E ointment.  I would also like to send her blood work for CYP2D6 testing to see if she has inability to tolerate tamoxifen. Birth control: Patient is apparently using NUVAring and she cannot take any other form of contraception. She is requesting that I send for Nuva ring. I discussed with her that it is not recommended to use estrogen contraceptive measures and patient understands the risks associated with this. Because she is on tamoxifen we may be able to counteract the effect of any estrogen that  would be absorbed through the NUVA ring. I discussed with her about Mirena as well as copper IUDs. Apparently she cannot use them because her cervix cannot be dilated.  Return to clinic in 3 months for follow-up   No orders of the defined types were placed in this encounter.  The patient has a good understanding of the overall plan. she agrees with it. she will call with any problems that may develop before the next visit here.   Rulon Eisenmenger, MD 09/22/16

## 2016-09-25 ENCOUNTER — Ambulatory Visit: Payer: Managed Care, Other (non HMO) | Admitting: Physical Therapy

## 2016-09-25 DIAGNOSIS — M79605 Pain in left leg: Secondary | ICD-10-CM

## 2016-09-25 DIAGNOSIS — M25552 Pain in left hip: Secondary | ICD-10-CM | POA: Diagnosis not present

## 2016-09-25 NOTE — Therapy (Signed)
[place], [place], [address] Phone: [phone]   Fax:  [phone]  Physical Therapy Treatment  Patient Details  Name: Allison Mcclure MRN: [id number] Date of Birth: 11/01/1975 Referring Provider: Kathyrn Lass, MD  Encounter Date: 09/25/2016      PT End of Session - 09/25/16 1245    Visit Number 9  2 for ortho    Number of Visits 16  8 visits for ortho    Date for PT Re-Evaluation 10/21/16   Authorization Type [employer] 60 visit limit   PT Start Time 1238   PT Stop Time 1316   PT Time Calculation (min) 38 min      Past Medical History:  Diagnosis Date  . Anxiety   . Breast cancer ([place])   . Cervical dysplasia   . Gastrointestinal disorder   . GERD (gastroesophageal reflux disease)   . GERD (gastroesophageal reflux disease)   . IBS (irritable bowel syndrome)   . Ovarian cyst     Past Surgical History:  Procedure Laterality Date  . arm surgery     left  . BREAST LUMPECTOMY WITH RADIOACTIVE SEED AND SENTINEL LYMPH NODE BIOPSY Right 03/01/2016   Procedure: RIGHT BREAST LUMPECTOMY WITH RADIOACTIVE SEED AND SENTINEL LYMPH NODE BIOPSY;  Surgeon: Stark Klein, MD;  Location: [facility];  Service: General;  Laterality: Right;  . CRYOTHERAPY    . EXCISION MASS LOWER EXTREMETIES Right 03/01/2016   Procedure: EXCISION MASS RIGHT THIGH;  Surgeon: Stark Klein, MD;  Location: [facility];  Service: General;  Laterality: Right;  . MOUTH SURGERY     "gum surgery" x 2  . TONSILLECTOMY    . WISDOM TOOTH EXTRACTION      There were no vitals filed for this visit.      Subjective Assessment - 09/25/16 1244    Subjective Had a lot of pain yesterday. I went to tour [employer] and did alot of walking.    Currently in Pain? Yes   Pain Score 3    Pain Location Sacrum   Pain Orientation Left   Pain Descriptors / Indicators Aching                          [facility] Adult PT Treatment/Exercise - 09/25/16 0001      Knee/Hip Exercises: Stretches   Piriformis Stretch 3 reps;30 seconds   Piriformis Stretch Limitations modified figure 4 and knee to opposite shoulder    Other Knee/Hip Stretches self posterior capsule stretch via roll to right with left foot on right knee 10 sec x 3      Knee/Hip Exercises: Supine   Bridges with Ball Squeeze 10 reps   Other Supine Knee/Hip Exercises hooklying abduction isometrics, adduction isometrics x10    Other Supine Knee/Hip Exercises pelvic tilt (paired with all exercises)     Manual Therapy   Manual Therapy Soft tissue mobilization   Joint Mobilization Long axis distraction left hip    Soft tissue mobilization In right sidelying with pillow between knees: left glute max, med soft tissue work with tennis ball,                 PT Education - 09/25/16 1417    Education provided Yes   Education Details HEP   Person(s) Educated Patient   Methods Explanation;Handout   Comprehension Verbalized understanding          PT Short Term Goals -  09/20/16 1323      PT SHORT TERM GOAL #1   Title Pt will be independent with HEP to improve long term stability in pelvis and SIJ by 2/2   Baseline began establishing at eval   Time 4   Period Weeks   Status New     PT SHORT TERM GOAL #2   Title Pt will verbalize improved posture during sleeping and sitting to avoid crossing legs   Baseline began educating at eval   Time 4   Period Weeks   Status New     PT SHORT TERM GOAL #3   Title Pt will verbalize average pain <=3/10 to decrease limitations to daily activities   Baseline 5/10 today but frequently higher   Time 4   Period Weeks   Status New     PT SHORT TERM GOAL #4   Title Pt will present with equal leg length indicating improved stability and reduction of functional LLD   Baseline L leg shorter at eval   Time 4   Period Weeks   Status New              Long Term Clinic Goals -  09/14/16 1511      CC Long Term Goal  #1   Title Patient will be knowledgeable about self-care for right breast lymphedema including manual lymph drainage and use of compression.   Status Achieved     CC Long Term Goal  #2   Title Patient will report at least 50% decrease in discomfort in right breast/flank area   Status Partially Met            Plan - 09/25/16 1330    Clinical Impression Statement Pt reports consistency with posture/ sleep change sugestions. She has perfromed pelvic tilt and ball squeeze in seated and hooklying. She has not performed abduction isometric. She has increased pain yesterday due to increased walking. She c/ tightness in upper/middle left gluteal muscles. Instructedpt in piriformis stretches and capsule stretch for HEP. Repeated long axis distraction to level pelvis. Performed manual to tender gluteal muscles. Pt reports       Patient will benefit from skilled therapeutic intervention in order to improve the following deficits and impairments:  Increased muscle spasms, Decreased activity tolerance, Pain, Improper body mechanics, Hypermobility, Decreased strength, Increased edema  Visit Diagnosis: Pain in left hip  Pain in left leg     Problem List Patient Active Problem List   Diagnosis Date Noted  . Dermatitis 06/08/2016  . Breast cancer of upper-outer quadrant of right female breast ([place]) 01/06/2016  . IBS (irritable bowel syndrome) 04/23/2013  . Chronic diarrhea 10/01/2012  . Anxiety 10/01/2012    Dorene Ar, PTA 09/25/2016, 2:45 PM  [place], [place], [address] Phone: [phone]   Fax:  [phone]  Name: Allison Mcclure MRN: [id number] Date of Birth: [birth year]

## 2016-09-27 ENCOUNTER — Ambulatory Visit: Payer: Managed Care, Other (non HMO) | Admitting: Physical Therapy

## 2016-09-27 ENCOUNTER — Ambulatory Visit: Payer: Managed Care, Other (non HMO)

## 2016-09-27 DIAGNOSIS — M25552 Pain in left hip: Secondary | ICD-10-CM

## 2016-09-27 DIAGNOSIS — Z483 Aftercare following surgery for neoplasm: Secondary | ICD-10-CM

## 2016-09-27 DIAGNOSIS — I890 Lymphedema, not elsewhere classified: Secondary | ICD-10-CM

## 2016-09-27 DIAGNOSIS — M25611 Stiffness of right shoulder, not elsewhere classified: Secondary | ICD-10-CM

## 2016-09-27 DIAGNOSIS — M79605 Pain in left leg: Secondary | ICD-10-CM

## 2016-09-27 DIAGNOSIS — M25511 Pain in right shoulder: Secondary | ICD-10-CM

## 2016-09-27 NOTE — Therapy (Signed)
[place], [place], [address] Phone: [phone]   Fax:  [phone]  Physical Therapy Treatment  Patient Details  Name: Allison Mcclure MRN: [id number] Date of Birth: 1976-09-06 Referring Provider: Kathyrn Lass, MD  Encounter Date: 09/27/2016      PT End of Session - 09/27/16 1208    Visit Number 11  3 for ortho   Number of Visits 16   PT Start Time 2446  pt arrived late   PT Stop Time 1230   PT Time Calculation (min) 38 min   Activity Tolerance Patient tolerated treatment well   Behavior During Therapy [facility] for tasks assessed/performed      Past Medical History:  Diagnosis Date  . Anxiety   . Breast cancer ([place])   . Cervical dysplasia   . Gastrointestinal disorder   . GERD (gastroesophageal reflux disease)   . GERD (gastroesophageal reflux disease)   . IBS (irritable bowel syndrome)   . Ovarian cyst     Past Surgical History:  Procedure Laterality Date  . arm surgery     left  . BREAST LUMPECTOMY WITH RADIOACTIVE SEED AND SENTINEL LYMPH NODE BIOPSY Right 03/01/2016   Procedure: RIGHT BREAST LUMPECTOMY WITH RADIOACTIVE SEED AND SENTINEL LYMPH NODE BIOPSY;  Surgeon: Stark Klein, MD;  Location: [facility];  Service: General;  Laterality: Right;  . CRYOTHERAPY    . EXCISION MASS LOWER EXTREMETIES Right 03/01/2016   Procedure: EXCISION MASS RIGHT THIGH;  Surgeon: Stark Klein, MD;  Location: [place] [facility];  Service: General;  Laterality: Right;  . MOUTH SURGERY     "gum surgery" x 2  . TONSILLECTOMY    . WISDOM TOOTH EXTRACTION      There were no vitals filed for this visit.      Subjective Assessment - 09/27/16 1203    Subjective Pt was walking around [place] this weekend making it "feel like it wanted to go out"  was able to manipulate to decrease pain. Put away christmas decorations and cleaned which increased pain, esp when vaccuming. Was able to  manipulate but did not do exercises.    Pain Score 1  Ortho    Pain Location Sacrum   Pain Orientation Left  L SIJ                         [facility] Adult PT Treatment/Exercise - 09/27/16 1240      Exercises   Exercises Lumbar     Lumbar Exercises: Seated   Other Seated Lumbar Exercises seated hinge back wiht 2lb weight overhead   Other Seated Lumbar Exercises V-outs ball bw knees     Lumbar Exercises: Supine   AB Set Limitations pelvic tilt with ball squeeze   Bridge Limitations with ball squeeze   Other Supine Lumbar Exercises hooklying clams with pelvic tilt   Other Supine Lumbar Exercises hips flexed 45 deg, pull out on tband around ankles     Lumbar Exercises: Prone   Other Prone Lumbar Exercises bilat LE ext with ball bw ankles, knees extended and knees flexed     Knee/Hip Exercises: Supine   Other Supine Knee/Hip Exercises supine eversion red tband     Knee/Hip Exercises: Sidelying   Clams L only                PT Education - 09/27/16 1205    Education provided Yes   Education Details exercise form/rationale, incorporation  into daily activities.    Person(s) Educated Patient   Methods Explanation;Demonstration;Tactile cues;Verbal cues   Comprehension Verbalized understanding;Returned demonstration;Verbal cues required;Tactile cues required;Need further instruction          PT Short Term Goals - 09/20/16 1323      PT SHORT TERM GOAL #1   Title Pt will be independent with HEP to improve long term stability in pelvis and SIJ by 2/2   Baseline began establishing at eval   Time 4   Period Weeks   Status New     PT SHORT TERM GOAL #2   Title Pt will verbalize improved posture during sleeping and sitting to avoid crossing legs   Baseline began educating at eval   Time 4   Period Weeks   Status New     PT SHORT TERM GOAL #3   Title Pt will verbalize average pain <=3/10 to decrease limitations to daily activities   Baseline 5/10 today  but frequently higher   Time 4   Period Weeks   Status New     PT SHORT TERM GOAL #4   Title Pt will present with equal leg length indicating improved stability and reduction of functional LLD   Baseline L leg shorter at eval   Time 4   Period Weeks   Status [facility] Goals - 09/27/16 1158      CC Long Term Goal  #1   Title Patient will be knowledgeable about self-care for right breast lymphedema including manual lymph drainage and use of compression.   Status Achieved     CC Long Term Goal  #2   Title Patient will report at least 50% decrease in discomfort in right breast/flank area   Baseline Pt reports this goal was met but feels not as much progress this week as her swelling is very intermittent-09/13/16; pt reports 40% improved with pain   Status On-going            Plan - 09/27/16 1236    Clinical Impression Statement SIJ was feeling better today. Pt was able to perform exericse without increased pain but did require cuing for engagement of abdominals and decrease overuse of QL.    PT Next Visit Plan Renewal for Cancer Rehab this visit. Cont manual lymph drainage to Rt breast and myofascial release to area of cording. Ortho: lumbo pelvic stability   Consulted and Agree with Plan of Care Patient      Patient will benefit from skilled therapeutic intervention in order to improve the following deficits and impairments:     Visit Diagnosis: Pain in left hip  Pain in left leg     Problem List Patient Active Problem List   Diagnosis Date Noted  . Dermatitis 06/08/2016  . Breast cancer of upper-outer quadrant of right female breast ([place]) 01/06/2016  . IBS (irritable bowel syndrome) 04/23/2013  . Chronic diarrhea 10/01/2012  . Anxiety 10/01/2012    Allison Mcclure C. Suad Autrey PT, DPT 09/27/16 12:44 PM   [facility] [address] [place], [place], [address] Phone: [phone]   Fax:   [phone]  Name: Allison Mcclure MRN: [id number] Date of Birth: 20-Mar-1976

## 2016-09-27 NOTE — Addendum Note (Signed)
Addended by: Jomarie Longs on: 09/27/2016 01:25 PM   Modules accepted: Orders

## 2016-09-27 NOTE — Therapy (Addendum)
[place], [place], [address] Phone: [phone]   Fax:  [phone]  Physical Therapy Treatment  Patient Details  Name: Allison Mcclure MRN: [id number] Date of Birth: 10-10-1975 Referring Provider: Kathyrn Lass, MD  Encounter Date: 09/27/2016      PT End of Session - 09/27/16 1153    Visit Number 10   Number of Visits 16   Date for PT Re-Evaluation --  10/25/16 for Cancer Rehab   Authorization Type [employer] 60 visit limit   PT Start Time 1109   PT Stop Time 1148   PT Time Calculation (min) 39 min   Activity Tolerance Patient tolerated treatment well   Behavior During Therapy [facility] for tasks assessed/performed      Past Medical History:  Diagnosis Date  . Anxiety   . Breast cancer ([place])   . Cervical dysplasia   . Gastrointestinal disorder   . GERD (gastroesophageal reflux disease)   . GERD (gastroesophageal reflux disease)   . IBS (irritable bowel syndrome)   . Ovarian cyst     Past Surgical History:  Procedure Laterality Date  . arm surgery     left  . BREAST LUMPECTOMY WITH RADIOACTIVE SEED AND SENTINEL LYMPH NODE BIOPSY Right 03/01/2016   Procedure: RIGHT BREAST LUMPECTOMY WITH RADIOACTIVE SEED AND SENTINEL LYMPH NODE BIOPSY;  Surgeon: Stark Klein, MD;  Location: [facility];  Service: General;  Laterality: Right;  . CRYOTHERAPY    . EXCISION MASS LOWER EXTREMETIES Right 03/01/2016   Procedure: EXCISION MASS RIGHT THIGH;  Surgeon: Stark Klein, MD;  Location: [place];  Service: General;  Laterality: Right;  . MOUTH SURGERY     "gum surgery" x 2  . TONSILLECTOMY    . WISDOM TOOTH EXTRACTION      There were no vitals filed for this visit.      Subjective Assessment - 09/27/16 1114    Subjective I saw Dr. Lindi Adie since I've seen you last and he took me off of Tamoxifen for now because he thinks that is slowing the healing of my Rt breast and Lt leg. He  also started me on a antidepressant that I've started taking every other day and I'll start taking daily in next few days (that was pts choice) and he also started me on Effexor. So right now I just feel kind of loopy getting used to the changes in my body with the meds.    Pertinent History Right breast cancer with lumpectomy with 4 lymph nodes removed, 03/01/16; started radiation 05/02/16 and finished 06/16/16.  Had breast hematoma that resolved in October.  Has started tamoxifen 08/02/16.  IBS with control with Prilosec, but that's not currently working, so she plans to see her doctor about it.  Reports she has issues with her SI joint and she has to "pop it all the time."  H/o left elbow fracture when in 2nd grade, with ORIF; now with scar approx. 4 inches long.   Limitations Sitting   Patient Stated Goals decrease pain   Currently in Pain? Yes   Pain Score 4    Pain Location Breast   Pain Orientation Right   Pain Descriptors / Indicators Aching;Burning   Pain Type Surgical pain   Pain Onset 1 to 4 weeks ago   Aggravating Factors  increase breast swelling   Pain Relieving Factors manual lymph drainage  [facility] Adult PT Treatment/Exercise - 09/27/16 0001      Manual Therapy   Manual Therapy Myofascial release;Passive ROM;Manual Lymphatic Drainage (MLD)   Myofascial Release To Rt lateral breast with pt in Lt S/L along area of cording, did this with P/ROM   Manual Lymphatic Drainage (MLD) In supine:  short neck, superficial and deep abdomen, left axilla and anterior interaxillary anastomosis, right groin and axillo-inguinal anastomosis, and right breast, directing towards pathways.  Then to sidelying for lateral breast and anastamosis.   Passive ROM To Rt shoulder in Lt S/L with towel roll under Lt ribs to increase stretch at Rt lateral breast cording, P/ROM into abduction to pts tolerance                  PT Short Term Goals - 09/20/16 1323       PT SHORT TERM GOAL #1   Title Pt will be independent with HEP to improve long term stability in pelvis and SIJ by 2/2   Baseline began establishing at eval   Time 4   Period Weeks   Status New     PT SHORT TERM GOAL #2   Title Pt will verbalize improved posture during sleeping and sitting to avoid crossing legs   Baseline began educating at eval   Time 4   Period Weeks   Status New     PT SHORT TERM GOAL #3   Title Pt will verbalize average pain <=3/10 to decrease limitations to daily activities   Baseline 5/10 today but frequently higher   Time 4   Period Weeks   Status New     PT SHORT TERM GOAL #4   Title Pt will present with equal leg length indicating improved stability and reduction of functional LLD   Baseline L leg shorter at eval   Time 4   Period Weeks   Status [facility] Goals - 09/27/16 1158      CC Long Term Goal  #1   Title Patient will be knowledgeable about self-care for right breast lymphedema including manual lymph drainage and use of compression.   Status Achieved     CC Long Term Goal  #2   Title Patient will report at least 50% decrease in discomfort in right breast/flank area   Baseline Pt reports this goal was met but feels not as much progress this week as her swelling is very intermittent-09/13/16; pt reports 40% improved with pain   Status On-going            Plan - 09/27/16 1154    Clinical Impression Statement Pt was doing much better today and reporting that she has noticed her Rt brest swelling has improved since being taken off Tamoxifen Friday by Dr. Lindi Adie who said it was slowing her healing. She was able to tolerate pressure much better today and her lymphedema was visibly and palpably much improved today. Pt very pleased with her progress over past few days thus far.    Rehab Potential Good   Clinical Impairments Affecting Rehab Potential previous radiation    PT Frequency 2x / week   PT Duration 4  weeks   PT Treatment/Interventions ADLs/Self Care Home Management;Cryotherapy;Functional mobility training;Stair training;Gait training;Moist Heat;Therapeutic activities;Therapeutic exercise;Balance training;Neuromuscular re-education;Patient/family education;Passive range of motion;Manual lymph drainage;Manual techniques;Taping   PT Next Visit Plan Renewal for Cancer Rehab this visit. Cont manual lymph drainage to Rt breast and  myofascial release to area of cording.    PT Home Exercise Plan Supine on floor over towel roll for bil UE abduction and same in Lt S/L over towel roll   Consulted and Agree with Plan of Care Patient      Patient will benefit from skilled therapeutic intervention in order to improve the following deficits and impairments:  Increased muscle spasms, Decreased activity tolerance, Pain, Improper body mechanics, Hypermobility, Decreased strength, Increased edema  Visit Diagnosis: Lymphedema, not elsewhere classified  Aftercare following surgery for neoplasm  Acute pain of right shoulder  Stiffness of right shoulder, not elsewhere classified     Problem List Patient Active Problem List   Diagnosis Date Noted  . Dermatitis 06/08/2016  . Breast cancer of upper-outer quadrant of right female breast ([place]) 01/06/2016  . IBS (irritable bowel syndrome) 04/23/2013  . Chronic diarrhea 10/01/2012  . Anxiety 10/01/2012    [name], PTA 09/27/2016, 11:59 AM  [place], [place], [address] Phone: [phone]   Fax:  [phone]  Name: ZHANIYA SWALLOWS MRN: [id number] Date of Birth: 09-05-1976  Serafina Royals, PT 09/27/16 1:24 PM

## 2016-10-03 ENCOUNTER — Encounter: Payer: Self-pay | Admitting: Physical Therapy

## 2016-10-03 ENCOUNTER — Ambulatory Visit: Payer: Managed Care, Other (non HMO) | Admitting: Physical Therapy

## 2016-10-03 DIAGNOSIS — M25611 Stiffness of right shoulder, not elsewhere classified: Secondary | ICD-10-CM

## 2016-10-03 DIAGNOSIS — M25552 Pain in left hip: Secondary | ICD-10-CM | POA: Diagnosis not present

## 2016-10-03 DIAGNOSIS — Z483 Aftercare following surgery for neoplasm: Secondary | ICD-10-CM

## 2016-10-03 DIAGNOSIS — I890 Lymphedema, not elsewhere classified: Secondary | ICD-10-CM

## 2016-10-03 DIAGNOSIS — M79605 Pain in left leg: Secondary | ICD-10-CM

## 2016-10-03 DIAGNOSIS — M25511 Pain in right shoulder: Secondary | ICD-10-CM

## 2016-10-03 NOTE — Progress Notes (Signed)
Received breast US from [facility]. Sent to scan

## 2016-10-03 NOTE — Therapy (Signed)
[place], [place], [address] Phone: [phone]   Fax:  [phone]  Physical Therapy Treatment  Patient Details  Name: Allison Mcclure MRN: [id number] Date of Birth: 16-Aug-1976 Referring Provider: Kathyrn Lass, MD  Encounter Date: 10/03/2016      PT End of Session - 10/03/16 1229    Visit Number 13   Number of Visits 16   Date for PT Re-Evaluation --  10/25/16 for cancer rehab   PT Start Time 1025  pt arrived late    PT Stop Time 1105   PT Time Calculation (min) 40 min   Activity Tolerance Patient tolerated treatment well   Behavior During Therapy [facility] for tasks assessed/performed      Past Medical History:  Diagnosis Date  . Anxiety   . Breast cancer ([place])   . Cervical dysplasia   . Gastrointestinal disorder   . GERD (gastroesophageal reflux disease)   . GERD (gastroesophageal reflux disease)   . IBS (irritable bowel syndrome)   . Ovarian cyst     Past Surgical History:  Procedure Laterality Date  . arm surgery     left  . BREAST LUMPECTOMY WITH RADIOACTIVE SEED AND SENTINEL LYMPH NODE BIOPSY Right 03/01/2016   Procedure: RIGHT BREAST LUMPECTOMY WITH RADIOACTIVE SEED AND SENTINEL LYMPH NODE BIOPSY;  Surgeon: Stark Klein, MD;  Location: [place];  Service: General;  Laterality: Right;  . CRYOTHERAPY    . EXCISION MASS LOWER EXTREMETIES Right 03/01/2016   Procedure: EXCISION MASS RIGHT THIGH;  Surgeon: Stark Klein, MD;  Location: [place];  Service: General;  Laterality: Right;  . MOUTH SURGERY     "gum surgery" x 2  . TONSILLECTOMY    . WISDOM TOOTH EXTRACTION      There were no vitals filed for this visit.      Subjective Assessment - 10/03/16 1148    Subjective Pt reports having L quadriceps pain following dancing this weekend, has had this happen before after dancing. Was wearing heels.   Currently in Pain? Yes   Pain Score 2    Pain Location  Leg   Pain Orientation Anterior;Upper;Left   Pain Descriptors / Indicators --  feels like muscle "ripped"                         [facility] Adult PT Treatment/Exercise - 10/03/16 1224      Self-Care   Self-Care Other Self-Care Comments   Other Self-Care Comments  provided small patch of small dotted foam to wear at ares of cording at inferior breast.      Shoulder Exercises: Sidelying   ABduction AROM;Right;5 reps  with deep breath and stretch at the top    Other Sidelying Exercises small circles with hand pointed toward ceiling      Manual Therapy   Manual Lymphatic Drainage (MLD) In supine:  short neck, superficial and deep abdomen, left axilla and anterior interaxillary anastomosis, right groin and axillo-inguinal anastomosis, and right breast, directing towards pathways.  Then to sidelying for lateral breast and anastamosis.   Passive ROM To Rt shoulder in Lt S/L with towel roll under Lt ribs to increase stretch at Rt lateral breast cording, P/ROM into abduction to pts tolerance                PT Education - 10/03/16 1239    Education provided Yes   Education Details exercise form/rationale, anatomy of condition,  POC, importance of habits   Person(s) Educated Patient   Methods Explanation;Demonstration;Tactile cues;Verbal cues   Comprehension Verbalized understanding;Returned demonstration;Verbal cues required;Tactile cues required;Need further instruction          PT Short Term Goals - 09/20/16 1323      PT SHORT TERM GOAL #1   Title Pt will be independent with HEP to improve long term stability in pelvis and SIJ by 2/2   Baseline began establishing at eval   Time 4   Period Weeks   Status New     PT SHORT TERM GOAL #2   Title Pt will verbalize improved posture during sleeping and sitting to avoid crossing legs   Baseline began educating at eval   Time 4   Period Weeks   Status New     PT SHORT TERM GOAL #3   Title Pt will verbalize average  pain <=3/10 to decrease limitations to daily activities   Baseline 5/10 today but frequently higher   Time 4   Period Weeks   Status New     PT SHORT TERM GOAL #4   Title Pt will present with equal leg length indicating improved stability and reduction of functional LLD   Baseline L leg shorter at eval   Time 4   Period Weeks   Status New              Long Term Clinic Goals - 09/27/16 1320      CC Long Term Goal  #3   Title Pt. will report at least 60% decrease in perception of cording at right flank/lateral breast area   Time 4   Period Weeks   Status New            Plan - 10/03/16 1231    Clinical Impression Statement Pt is overall feeling better, but still has painful cording at right inferior breast that extends into right lower outer breast quadrant.  cording at lateral breast seems better to patient.  Issued small foam patch to place on  inferior cord.   Issued flyer from [facility] exercise and encouraged pt to consider yoga for upper trunk and shoulder stretching in addition to regular exercise.  She will also consider stationary bike if ok with ortho PT.    Rehab Potential Good   Clinical Impairments Affecting Rehab Potential previous radiation    PT Frequency 2x / week   PT Duration 4 weeks   PT Treatment/Interventions ADLs/Self Care Home Management;Cryotherapy;Functional mobility training;Stair training;Gait training;Moist Heat;Therapeutic activities;Therapeutic exercise;Balance training;Neuromuscular re-education;Patient/family education;Passive range of motion;Manual lymph drainage;Manual techniques;Taping   PT Next Visit Plan Cont manual lymph drainage to Rt breast and myofascial release to area of cording. Ortho: lumbo pelvic stability   Consulted and Agree with Plan of Care Patient      Patient will benefit from skilled therapeutic intervention in order to improve the following deficits and impairments:     Visit Diagnosis: Pain in left hip  Pain  in left leg     Problem List Patient Active Problem List   Diagnosis Date Noted  . Dermatitis 06/08/2016  . Breast cancer of upper-outer quadrant of right female breast ([place]) 01/06/2016  . IBS (irritable bowel syndrome) 04/23/2013  . Chronic diarrhea 10/01/2012  . Anxiety 10/01/2012    Shali Vesey C. Justun Anaya PT, DPT 10/03/16 12:41 PM   [facility] [address] [place], [place], [address] Phone: [phone]   Fax:  [phone]  Name: DELISSA ZOERB  MRN: [id number] Date of Birth: 16-Jun-1976

## 2016-10-03 NOTE — Therapy (Signed)
[place], [place], [address] Phone: [phone]   Fax:  [phone]  Physical Therapy Treatment  Patient Details  Name: Allison Mcclure MRN: [id number] Date of Birth: Dec 08, 1975 Referring Provider: Kathyrn Lass, MD  Encounter Date: 10/03/2016      PT End of Session - 10/03/16 1229    Visit Number 13   Number of Visits 16   Date for PT Re-Evaluation --  10/25/16 for cancer rehab   PT Start Time 1025  pt arrived late    PT Stop Time 1105   PT Time Calculation (min) 40 min   Activity Tolerance Patient tolerated treatment well   Behavior During Therapy [facility] for tasks assessed/performed      Past Medical History:  Diagnosis Date  . Anxiety   . Breast cancer ([place])   . Cervical dysplasia   . Gastrointestinal disorder   . GERD (gastroesophageal reflux disease)   . GERD (gastroesophageal reflux disease)   . IBS (irritable bowel syndrome)   . Ovarian cyst     Past Surgical History:  Procedure Laterality Date  . arm surgery     left  . BREAST LUMPECTOMY WITH RADIOACTIVE SEED AND SENTINEL LYMPH NODE BIOPSY Right 03/01/2016   Procedure: RIGHT BREAST LUMPECTOMY WITH RADIOACTIVE SEED AND SENTINEL LYMPH NODE BIOPSY;  Surgeon: Stark Klein, MD;  Location: [place];  Service: General;  Laterality: Right;  . CRYOTHERAPY    . EXCISION MASS LOWER EXTREMETIES Right 03/01/2016   Procedure: EXCISION MASS RIGHT THIGH;  Surgeon: Stark Klein, MD;  Location: [place];  Service: General;  Laterality: Right;  . MOUTH SURGERY     "gum surgery" x 2  . TONSILLECTOMY    . WISDOM TOOTH EXTRACTION      There were no vitals filed for this visit.      Subjective Assessment - 10/03/16 1148    Subjective Pt reports having L quadriceps pain following dancing this weekend, has had this happen before after dancing. Was wearing heels.   Currently in Pain? Yes   Pain Score 2    Pain  Location Leg   Pain Orientation Anterior;Upper;Left   Pain Descriptors / Indicators --  feels like muscle "ripped"                         [facility] Adult PT Treatment/Exercise - 10/03/16 1224      Self-Care   Self-Care Other Self-Care Comments   Other Self-Care Comments  provided small patch of small dotted foam to wear at ares of cording at inferior breast.      Shoulder Exercises: Sidelying   ABduction AROM;Right;5 reps  with deep breath and stretch at the top    Other Sidelying Exercises small circles with hand pointed toward ceiling      Manual Therapy   Manual Lymphatic Drainage (MLD) In supine:  short neck, superficial and deep abdomen, left axilla and anterior interaxillary anastomosis, right groin and axillo-inguinal anastomosis, and right breast, directing towards pathways.  Then to sidelying for lateral breast and anastamosis.   Passive ROM To Rt shoulder in Lt S/L with towel roll under Lt ribs to increase stretch at Rt lateral breast cording, P/ROM into abduction to pts tolerance                  PT Short Term Goals - 09/20/16 1323      PT SHORT TERM GOAL #1  Title Pt will be independent with HEP to improve long term stability in pelvis and SIJ by 2/2   Baseline began establishing at eval   Time 4   Period Weeks   Status New     PT SHORT TERM GOAL #2   Title Pt will verbalize improved posture during sleeping and sitting to avoid crossing legs   Baseline began educating at eval   Time 4   Period Weeks   Status New     PT SHORT TERM GOAL #3   Title Pt will verbalize average pain <=3/10 to decrease limitations to daily activities   Baseline 5/10 today but frequently higher   Time 4   Period Weeks   Status New     PT SHORT TERM GOAL #4   Title Pt will present with equal leg length indicating improved stability and reduction of functional LLD   Baseline L leg shorter at eval   Time 4   Period Weeks   Status New               Long Term Clinic Goals - 09/27/16 1320      CC Long Term Goal  #3   Title Pt. will report at least 60% decrease in perception of cording at right flank/lateral breast area   Time 4   Period Weeks   Status New            Plan - 10/03/16 1231    Clinical Impression Statement Pt is overall feeling better, but still has painful cording at right inferior breast that extends into right lower outer breast quadrant.  cording at lateral breast seems better to patient.  Issued small foam patch to place on  inferior cord.   Issued flyer from [facility] exercise and encouraged pt to consider yoga for upper trunk and shoulder stretching in addition to regular exercise.  She will also consider stationary bike if ok with ortho PT.    Rehab Potential Good   Clinical Impairments Affecting Rehab Potential previous radiation    PT Frequency 2x / week   PT Duration 4 weeks   PT Treatment/Interventions ADLs/Self Care Home Management;Cryotherapy;Functional mobility training;Stair training;Gait training;Moist Heat;Therapeutic activities;Therapeutic exercise;Balance training;Neuromuscular re-education;Patient/family education;Passive range of motion;Manual lymph drainage;Manual techniques;Taping   PT Next Visit Plan Cont manual lymph drainage to Rt breast and myofascial release to area of cording. Ortho: lumbo pelvic stability   Consulted and Agree with Plan of Care Patient      Patient will benefit from skilled therapeutic intervention in order to improve the following deficits and impairments:  Increased muscle spasms, Decreased activity tolerance, Pain, Improper body mechanics, Hypermobility, Decreased strength, Increased edema  Visit Diagnosis: Lymphedema, not elsewhere classified  Aftercare following surgery for neoplasm  Acute pain of right shoulder  Stiffness of right shoulder, not elsewhere classified     Problem List Patient Active Problem List   Diagnosis Date Noted  . Dermatitis  06/08/2016  . Breast cancer of upper-outer quadrant of right female breast ([place]) 01/06/2016  . IBS (irritable bowel syndrome) 04/23/2013  . Chronic diarrhea 10/01/2012  . Anxiety 10/01/2012   [name] PT  [name] 10/03/2016, 12:35 PM  [place], [place], [address] Phone: [phone]   Fax:  [phone]  Name: Allison Mcclure MRN: [id number] Date of Birth: November 26, 1975

## 2016-10-05 ENCOUNTER — Telehealth: Payer: Self-pay | Admitting: Hematology and Oncology

## 2016-10-05 ENCOUNTER — Ambulatory Visit: Payer: Managed Care, Other (non HMO) | Admitting: Physical Therapy

## 2016-10-05 MED ORDER — TAMOXIFEN CITRATE 20 MG PO TABS: 10.0000 mg | ORAL_TABLET | Freq: Every day | ORAL | 3 refills | Status: DC

## 2016-10-05 NOTE — Telephone Encounter (Signed)
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 Patient cannot tolerate Tamoxifen Encouraged her to take 5 mg tamoxifen She tells me that her symptoms of pain and anger went away as soon as she stopped Tamoxifen.

## 2016-10-09 ENCOUNTER — Ambulatory Visit: Payer: Managed Care, Other (non HMO) | Admitting: Hematology and Oncology

## 2016-10-09 ENCOUNTER — Ambulatory Visit: Payer: Managed Care, Other (non HMO) | Admitting: Physical Therapy

## 2016-10-09 ENCOUNTER — Encounter: Payer: Self-pay | Admitting: Physical Therapy

## 2016-10-09 DIAGNOSIS — Z483 Aftercare following surgery for neoplasm: Secondary | ICD-10-CM

## 2016-10-09 DIAGNOSIS — M25552 Pain in left hip: Secondary | ICD-10-CM | POA: Diagnosis not present

## 2016-10-09 DIAGNOSIS — M79605 Pain in left leg: Secondary | ICD-10-CM

## 2016-10-09 DIAGNOSIS — M25511 Pain in right shoulder: Secondary | ICD-10-CM

## 2016-10-09 NOTE — Therapy (Signed)
[place], [place], [address] Phone: [phone]   Fax:  [phone]  Physical Therapy Treatment  Patient Details  Name: Allison Mcclure MRN: [id number] Date of Birth: 1976/03/27 Referring Provider: Kathyrn Lass, MD  Encounter Date: 10/09/2016      PT End of Session - 10/09/16 1337    Visit Number 15   Number of Visits 16   Authorization Type [employer] 60 visit limit   PT Start Time [id number]   PT Stop Time 1325   PT Time Calculation (min) 40 min   Activity Tolerance Patient tolerated treatment well   Behavior During Therapy [facility] for tasks assessed/performed      Past Medical History:  Diagnosis Date  . Anxiety   . Breast cancer ([place])   . Cervical dysplasia   . Gastrointestinal disorder   . GERD (gastroesophageal reflux disease)   . GERD (gastroesophageal reflux disease)   . IBS (irritable bowel syndrome)   . Ovarian cyst     Past Surgical History:  Procedure Laterality Date  . arm surgery     left  . BREAST LUMPECTOMY WITH RADIOACTIVE SEED AND SENTINEL LYMPH NODE BIOPSY Right 03/01/2016   Procedure: RIGHT BREAST LUMPECTOMY WITH RADIOACTIVE SEED AND SENTINEL LYMPH NODE BIOPSY;  Surgeon: Stark Klein, MD;  Location: [place];  Service: General;  Laterality: Right;  . CRYOTHERAPY    . EXCISION MASS LOWER EXTREMETIES Right 03/01/2016   Procedure: EXCISION MASS RIGHT THIGH;  Surgeon: Stark Klein, MD;  Location: [place];  Service: General;  Laterality: Right;  . MOUTH SURGERY     "gum surgery" x 2  . TONSILLECTOMY    . WISDOM TOOTH EXTRACTION      There were no vitals filed for this visit.      Subjective Assessment - 10/09/16 1331    Subjective Pt reporting 2/10 pain upon arrival to hip hip and thigh. Pt reporting wearing heels which may have aggrivated her pain.    Limitations Sitting   Patient Stated Goals decrease pain   Currently in Pain? Yes   Pain Score  2    Pain Location Hip   Pain Orientation Left   Pain Descriptors / Indicators Sore   Pain Type Chronic pain   Pain Onset 1 to 4 weeks ago   Pain Frequency Intermittent   Aggravating Factors  sitting a long time, wearing heels, walking long distances   Pain Relieving Factors unsure                         [facility] Adult PT Treatment/Exercise - 10/09/16 1257      Lumbar Exercises: Supine   AB Set Limitations pelvic tilts with ball between knees with squeezes     Knee/Hip Exercises: Sidelying   Hip ABduction 20 reps   Hip ADduction 10 reps;2 sets   Clams Reverse clam 20 reps 2 sets     Knee/Hip Exercises: Prone   Hip Extension Limitations isometric hamstring contraction with ball squeeze     Manual Therapy   Myofascial Release roller left IT band and left Sartoris with several trigger point releases to supeior IT band and Distal Sartoris.                 PT Education - 10/09/16 1335    Education Details Continue with HEP and cord stretches   Person(s) Educated Patient   Methods Explanation   Comprehension Verbalized understanding  PT Short Term Goals - 10/09/16 1501      PT SHORT TERM GOAL #1   Title Pt will be independent with HEP to improve long term stability in pelvis and SIJ by 2/2   Baseline began establishing at eval   Time 4   Period Weeks   Status New     PT SHORT TERM GOAL #2   Title Pt will verbalize improved posture during sleeping and sitting to avoid crossing legs   Baseline began educating at eval   Time 4   Period Weeks   Status New     PT SHORT TERM GOAL #3   Title Pt will verbalize average pain <=3/10 to decrease limitations to daily activities   Time 4   Period Weeks   Status New     PT SHORT TERM GOAL #4   Title Pt will present with equal leg length indicating improved stability and reduction of functional LLD   Baseline L leg shorter at eval   Time 4   Period Weeks   Status New               Long Term Clinic Goals - 09/27/16 1320      CC Long Term Goal  #3   Title Pt. will report at least 60% decrease in perception of cording at right flank/lateral breast area   Time 4   Period Weeks   Status New            Plan - 10/09/16 1457    Clinical Impression Statement Pt tolerated treatment well today. Pt with tenderness noted to left IT band and Left Sartoris muscle with less tightness noted at the distal insertion of the Sartoris today compared to last visit per pt report. Pt reported 2/10 pain upon arrival and 0/10 pain at end of session. Pt instructed to continue her HEP and cord stretches.    Rehab Potential Good   PT Frequency 2x / week   PT Duration 4 weeks   PT Treatment/Interventions ADLs/Self Care Home Management;Cryotherapy;Functional mobility training;Stair training;Gait training;Moist Heat;Therapeutic activities;Therapeutic exercise;Balance training;Neuromuscular re-education;Patient/family education;Passive range of motion;Manual lymph drainage;Manual techniques;Taping   PT Next Visit Plan Check goals. Continue soft tissue mobilization, stretching, and myofascial release with focus on areas of cording.  Ortho: lumbopelvic stability   PT Home Exercise Plan Supine on floor over towel roll for bil UE abduction and same in Lt S/L over towel roll, use of gluts to stand/sit, cord stretches   Consulted and Agree with Plan of Care Patient      Patient will benefit from skilled therapeutic intervention in order to improve the following deficits and impairments:  Increased muscle spasms, Decreased activity tolerance, Pain, Improper body mechanics, Hypermobility, Decreased strength, Increased edema, Difficulty walking, Decreased range of motion  Visit Diagnosis: Pain in left hip  Pain in left leg     Problem List Patient Active Problem List   Diagnosis Date Noted  . Dermatitis 06/08/2016  . Breast cancer of upper-outer quadrant of right female breast ([place]) 01/06/2016   . IBS (irritable bowel syndrome) 04/23/2013  . Chronic diarrhea 10/01/2012  . Anxiety 10/01/2012    Oretha Caprice, MPT 10/09/2016, 3:03 PM  [facility] [address] [place], [place], [address] Phone: [phone]   Fax:  [phone]  Name: Allison Mcclure MRN: [id number] Date of Birth: 1976/05/20

## 2016-10-09 NOTE — Therapy (Signed)
[place], [place], [address] Phone: [phone]   Fax:  [phone]  Physical Therapy Treatment  Patient Details  Name: Allison Mcclure MRN: [id number] Date of Birth: 11-15-1975 Referring Provider: Kathyrn Lass, MD  Encounter Date: 10/09/2016      PT End of Session - 10/09/16 1236    Visit Number 14   Number of Visits 16   Date for PT Re-Evaluation --  10/25/16 for cancer rehab   Authorization Type [employer] 60 visit limit   PT Start Time 1110   PT Stop Time 1156   PT Time Calculation (min) 46 min   Activity Tolerance Patient tolerated treatment well;Patient limited by pain   Behavior During Therapy [facility] for tasks assessed/performed      Past Medical History:  Diagnosis Date  . Anxiety   . Breast cancer ([place])   . Cervical dysplasia   . Gastrointestinal disorder   . GERD (gastroesophageal reflux disease)   . GERD (gastroesophageal reflux disease)   . IBS (irritable bowel syndrome)   . Ovarian cyst     Past Surgical History:  Procedure Laterality Date  . arm surgery     left  . BREAST LUMPECTOMY WITH RADIOACTIVE SEED AND SENTINEL LYMPH NODE BIOPSY Right 03/01/2016   Procedure: RIGHT BREAST LUMPECTOMY WITH RADIOACTIVE SEED AND SENTINEL LYMPH NODE BIOPSY;  Surgeon: Stark Klein, MD;  Location: [place];  Service: General;  Laterality: Right;  . CRYOTHERAPY    . EXCISION MASS LOWER EXTREMETIES Right 03/01/2016   Procedure: EXCISION MASS RIGHT THIGH;  Surgeon: Stark Klein, MD;  Location: [place];  Service: General;  Laterality: Right;  . MOUTH SURGERY     "gum surgery" x 2  . TONSILLECTOMY    . WISDOM TOOTH EXTRACTION      There were no vitals filed for this visit.      Subjective Assessment - 10/09/16 1114    Subjective Breast doesn't feel swollen anymore, and the pain is gone, both since I went off the tamoxifen, but I'm supposed to start it back up  again this Friday.  Still has cording.   Currently in Pain? Yes   Pain Score 4    Pain Location Breast   Pain Orientation Right;Upper;Lateral   Pain Descriptors / Indicators Sore   Aggravating Factors  unsure   Pain Relieving Factors doesn't know                         [facility] Adult PT Treatment/Exercise - 10/09/16 0001      Manual Therapy   Soft tissue mobilization in left sidelying to cording at right lateral breast; in supine to cording at inferior-lateral breast and inferior to breast.  Tried left sidelying with right arm in D2 upper position and right shoulder retracted, but this was unsuccessful.   Myofascial Release In supine, crosshands in vertical direction for right chest, crosshands with one hand on right UE into abduction and other hand at medial abdomen; in left sidelying, release across lateral breast area of cording and release with one hand on right arm in flexion and other at lower right flank.                PT Education - 10/09/16 1236    Education provided Yes   Education Details continue to stretch cord areas   Person(s) Educated Patient   Methods Explanation   Comprehension Verbalized understanding  PT Short Term Goals - 09/20/16 1323      PT SHORT TERM GOAL #1   Title Pt will be independent with HEP to improve long term stability in pelvis and SIJ by 2/2   Baseline began establishing at eval   Time 4   Period Weeks   Status New     PT SHORT TERM GOAL #2   Title Pt will verbalize improved posture during sleeping and sitting to avoid crossing legs   Baseline began educating at eval   Time 4   Period Weeks   Status New     PT SHORT TERM GOAL #3   Title Pt will verbalize average pain <=3/10 to decrease limitations to daily activities   Baseline 5/10 today but frequently higher   Time 4   Period Weeks   Status New     PT SHORT TERM GOAL #4   Title Pt will present with equal leg length indicating improved stability  and reduction of functional LLD   Baseline L leg shorter at eval   Time 4   Period Weeks   Status [facility] Goals - 09/27/16 1320      CC Long Term Goal  #3   Title Pt. will report at least 60% decrease in perception of cording at right flank/lateral breast area   Time 4   Period Weeks   Status New            Plan - 10/09/16 1237    Clinical Impression Statement Patient did well overall today, though some stretches were painful so had to be stopped.  Cord areas tend to be painful with direct pressure, so some of the time, attempted to stretch these areas with pressure away from the cord.  Pt. and therapist did both hear a "pop" of the cord inferior to the right breast, and that did release some of that tightness, but the cording remained that is at the inferolateral aspect of the breast.  Therapist and pt. discussed whether she finds therapy sessions beneficial for the cording issue, and she does, so we will plan to continue with this. She feels like the breast swelling has resolved (perhaps with going off tamoxifen).   Rehab Potential Good   Clinical Impairments Affecting Rehab Potential previous radiation    PT Frequency 2x / week   PT Duration 4 weeks   PT Treatment/Interventions ADLs/Self Care Home Management;Cryotherapy;Functional mobility training;Stair training;Gait training;Moist Heat;Therapeutic activities;Therapeutic exercise;Balance training;Neuromuscular re-education;Patient/family education;Passive range of motion;Manual lymph drainage;Manual techniques;Taping   PT Next Visit Plan Check goals. Continue soft tissue mobilization, stretching, and myofascial release with focus on areas of cording.  Ortho: lumbopelvic stability   Consulted and Agree with Plan of Care Patient      Patient will benefit from skilled therapeutic intervention in order to improve the following deficits and impairments:  Increased muscle spasms, Decreased activity  tolerance, Pain, Improper body mechanics, Hypermobility, Decreased strength, Increased edema  Visit Diagnosis: Aftercare following surgery for neoplasm  Acute pain of right shoulder     Problem List Patient Active Problem List   Diagnosis Date Noted  . Dermatitis 06/08/2016  . Breast cancer of upper-outer quadrant of right female breast ([place]) 01/06/2016  . IBS (irritable bowel syndrome) 04/23/2013  . Chronic diarrhea 10/01/2012  . Anxiety 10/01/2012    SALISBURY,DONNA 10/09/2016, 12:41 PM  [place],  [place], [address] Phone: [phone]   Fax:  [phone]  Name: Allison Mcclure MRN: [id number] Date of Birth: 1976/01/20  Serafina Royals, PT 10/09/16 12:42 PM

## 2016-10-11 ENCOUNTER — Encounter: Payer: Managed Care, Other (non HMO) | Admitting: Physical Therapy

## 2016-10-11 ENCOUNTER — Ambulatory Visit: Payer: Managed Care, Other (non HMO) | Admitting: Physical Therapy

## 2016-10-11 DIAGNOSIS — M25552 Pain in left hip: Secondary | ICD-10-CM

## 2016-10-11 DIAGNOSIS — M79605 Pain in left leg: Secondary | ICD-10-CM

## 2016-10-11 NOTE — Therapy (Signed)
[place], [place], [address] Phone: [phone]   Fax:  [phone]  Physical Therapy Treatment  Patient Details  Name: Allison Mcclure MRN: [id number] Date of Birth: 03-03-1976 Referring Provider: Kathyrn Lass, MD  Encounter Date: 10/11/2016      PT End of Session - 10/11/16 1322    Visit Number 16   Number of Visits 16   PT Start Time 0105      Past Medical History:  Diagnosis Date  . Anxiety   . Breast cancer ([place])   . Cervical dysplasia   . Gastrointestinal disorder   . GERD (gastroesophageal reflux disease)   . GERD (gastroesophageal reflux disease)   . IBS (irritable bowel syndrome)   . Ovarian cyst     Past Surgical History:  Procedure Laterality Date  . arm surgery     left  . BREAST LUMPECTOMY WITH RADIOACTIVE SEED AND SENTINEL LYMPH NODE BIOPSY Right 03/01/2016   Procedure: RIGHT BREAST LUMPECTOMY WITH RADIOACTIVE SEED AND SENTINEL LYMPH NODE BIOPSY;  Surgeon: Stark Klein, MD;  Location: [place];  Service: General;  Laterality: Right;  . CRYOTHERAPY    . EXCISION MASS LOWER EXTREMETIES Right 03/01/2016   Procedure: EXCISION MASS RIGHT THIGH;  Surgeon: Stark Klein, MD;  Location: [place];  Service: General;  Laterality: Right;  . MOUTH SURGERY     "gum surgery" x 2  . TONSILLECTOMY    . WISDOM TOOTH EXTRACTION      There were no vitals filed for this visit.      Subjective Assessment - 10/11/16 1321    Subjective My Lipoma has moved more medial. Hurts more when sitting because legs are short and rest on the chair. Back pain has mostly resolved.   Currently in Pain? No/denies                         [facility] Adult PT Treatment/Exercise - 10/11/16 0001      Lumbar Exercises: Supine   Bent Knee Raise Limitations 20 reps from table top      Knee/Hip Exercises: Stretches   Active Hamstring Stretch 3 reps;30 seconds   Quad  Stretch 3 reps;30 seconds   Quad Stretch Limitations prone wiht strap and sidelying with hip flexor stretch bilateral      Knee/Hip Exercises: Sidelying   Hip ABduction 20 reps   Hip ADduction 10 reps;2 sets   Clams [place] clam x10      Manual Therapy   Myofascial Release roller left IT band and left Sartoris with several trigger point releases to supeior IT band and Distal Sartoris.                   PT Short Term Goals - 10/11/16 1313      PT SHORT TERM GOAL #1   Title Pt will be independent with HEP to improve long term stability in pelvis and SIJ by 2/2   Time 4   Period Weeks   Status Achieved     PT SHORT TERM GOAL #2   Title Pt will verbalize improved posture during sleeping and sitting to avoid crossing legs   Time 4   Period Weeks   Status Achieved     PT SHORT TERM GOAL #3   Baseline mostly painfree   Time 4   Period Weeks   Status Achieved     PT SHORT TERM GOAL #4  Title Pt will present with equal leg length indicating improved stability and reduction of functional LLD   Time 4   Period Weeks   Status On-going              Long Term Clinic Goals - 09/27/16 1320      CC Long Term Goal  #3   Title Pt. will report at least 60% decrease in perception of cording at right flank/lateral breast area   Time 4   Period Weeks   Status New            Plan - 10/11/16 1323    Clinical Impression Statement Pt presents with decreased Lumbar pain. Her primary concern is the pain associated with Lipoma in left thigh. Instructed her in hip flexor and quad stretch, hamstring stretch. Repeated manual as she reports this is helpful. Added abdominal strengthening with good tolerance. Likely discharge soon.    PT Next Visit Plan Check goals. Continue soft tissue mobilization, stretching, and myofascial release with focus on areas of cording.  Ortho: lumbopelvic stability   Consulted and Agree with Plan of Care Patient      Patient will benefit  from skilled therapeutic intervention in order to improve the following deficits and impairments:  Increased muscle spasms, Decreased activity tolerance, Pain, Improper body mechanics, Hypermobility, Decreased strength, Increased edema, Difficulty walking, Decreased range of motion  Visit Diagnosis: Pain in left hip  Pain in left leg     Problem List Patient Active Problem List   Diagnosis Date Noted  . Dermatitis 06/08/2016  . Breast cancer of upper-outer quadrant of right female breast ([place]) 01/06/2016  . IBS (irritable bowel syndrome) 04/23/2013  . Chronic diarrhea 10/01/2012  . Anxiety 10/01/2012    Dorene Ar 10/11/2016, 1:44 PM  [place], [place], [address] Phone: [phone]   Fax:  [phone]  Name: Allison Mcclure MRN: [id number] Date of Birth: [birth year]

## 2016-10-16 ENCOUNTER — Encounter: Payer: Self-pay | Admitting: Physical Therapy

## 2016-10-16 ENCOUNTER — Ambulatory Visit: Payer: Managed Care, Other (non HMO)

## 2016-10-16 ENCOUNTER — Ambulatory Visit: Payer: Managed Care, Other (non HMO) | Admitting: Physical Therapy

## 2016-10-16 DIAGNOSIS — I890 Lymphedema, not elsewhere classified: Secondary | ICD-10-CM

## 2016-10-16 DIAGNOSIS — Z483 Aftercare following surgery for neoplasm: Secondary | ICD-10-CM

## 2016-10-16 DIAGNOSIS — M25552 Pain in left hip: Secondary | ICD-10-CM

## 2016-10-16 DIAGNOSIS — M25511 Pain in right shoulder: Secondary | ICD-10-CM

## 2016-10-16 DIAGNOSIS — M79605 Pain in left leg: Secondary | ICD-10-CM

## 2016-10-16 NOTE — Therapy (Signed)
[place], [place], [address] Phone: [phone]   Fax:  [phone]  Physical Therapy Treatment  Patient Details  Name: Allison Mcclure MRN: [id number] Date of Birth: 11-May-1976 Referring Provider: Kathyrn Lass, MD  Encounter Date: 10/16/2016      PT End of Session - 10/16/16 1150    Visit Number --  23 for Cancer Rehab   Number of Visits --  41 for Cancer Rehab   Date for PT Re-Evaluation --  10/25/16 for Cancer Rehab   PT Start Time 1105   PT Stop Time 1148   PT Time Calculation (min) 43 min   Activity Tolerance Patient tolerated treatment well   Behavior During Therapy [facility] for tasks assessed/performed      Past Medical History:  Diagnosis Date  . Anxiety   . Breast cancer ([place])   . Cervical dysplasia   . Gastrointestinal disorder   . GERD (gastroesophageal reflux disease)   . GERD (gastroesophageal reflux disease)   . IBS (irritable bowel syndrome)   . Ovarian cyst     Past Surgical History:  Procedure Laterality Date  . arm surgery     left  . BREAST LUMPECTOMY WITH RADIOACTIVE SEED AND SENTINEL LYMPH NODE BIOPSY Right 03/01/2016   Procedure: RIGHT BREAST LUMPECTOMY WITH RADIOACTIVE SEED AND SENTINEL LYMPH NODE BIOPSY;  Surgeon: Stark Klein, MD;  Location: [place];  Service: General;  Laterality: Right;  . CRYOTHERAPY    . EXCISION MASS LOWER EXTREMETIES Right 03/01/2016   Procedure: EXCISION MASS RIGHT THIGH;  Surgeon: Stark Klein, MD;  Location: [place];  Service: General;  Laterality: Right;  . MOUTH SURGERY     "gum surgery" x 2  . TONSILLECTOMY    . WISDOM TOOTH EXTRACTION      There were no vitals filed for this visit.      Subjective Assessment - 10/16/16 1109    Subjective My Rt breast has hurt and felt swollen since the last time I was here. I was glad she got a cord to "pop" but then I found another one under my breast and I've  been hurting for the past week.    Pertinent History Right breast cancer with lumpectomy with 4 lymph nodes removed, 03/01/16; started radiation 05/02/16 and finished 06/16/16.  Had breast hematoma that resolved in October.  Has started tamoxifen 08/02/16.  IBS with control with Prilosec, but that's not currently working, so she plans to see her doctor about it.  Reports she has issues with her SI joint and she has to "pop it all the time."  H/o left elbow fracture when in 2nd grade, with ORIF; now with scar approx. 4 inches long.   Patient Stated Goals decrease pain   Currently in Pain? Yes   Pain Score 3    Pain Location Breast   Pain Orientation Right   Pain Descriptors / Indicators Sore   Pain Type Chronic pain   Pain Onset 1 to 4 weeks ago   Pain Frequency Constant   Aggravating Factors  just flared up over past week   Pain Relieving Factors unsure                         [facility] Adult PT Treatment/Exercise - 10/16/16 0001      Manual Therapy   Manual Lymphatic Drainage (MLD) In supine:  short neck, superficial and deep abdomen, left axilla and anterior  interaxillary anastomosis, right groin and axillo-inguinal anastomosis, and right breast, directing towards pathways.  Then to sidelying for lateral breast and anastamosis.                  PT Short Term Goals - 10/11/16 1313      PT SHORT TERM GOAL #1   Title Pt will be independent with HEP to improve long term stability in pelvis and SIJ by 2/2   Time 4   Period Weeks   Status Achieved     PT SHORT TERM GOAL #2   Title Pt will verbalize improved posture during sleeping and sitting to avoid crossing legs   Time 4   Period Weeks   Status Achieved     PT SHORT TERM GOAL #3   Baseline mostly painfree   Time 4   Period Weeks   Status Achieved     PT SHORT TERM GOAL #4   Title Pt will present with equal leg length indicating improved stability and reduction of functional LLD   Time 4   Period Weeks    Status On-going              Long Term Clinic Goals - 10/16/16 1200      CC Long Term Goal  #1   Title Patient will be knowledgeable about self-care for right breast lymphedema including manual lymph drainage and use of compression.   Status Achieved     CC Long Term Goal  #2   Title Patient will report at least 50% decrease in discomfort in right breast/flank area   Baseline Pt reports this goal was met but feels not as much progress this week as her swelling is very intermittent-09/13/16; pt reports 40% improved with pain; 90% improvement at this time-10/16/16   Status Achieved     CC Long Term Goal  #3   Title Pt. will report at least 60% decrease in perception of cording at right flank/lateral breast area   Baseline 40% improvement with lateral breast, but has new cord at inferior breast-10/16/16   Status On-going            Plan - 10/16/16 1156    Clinical Impression Statement Pt reports has been sore since last visit after increased stretching/pressure for "cord to pop". She did have relief here after at area of cord that released but she found another still inferior to breast but more medially. Focused on this area with myofascial release today and continued with manual lymph drainage though pts breast felt very soft today compared to when this therapist saw pt last . Discussed with pt need for continuing at this time and she agreed that she does not feel the need for future appointments with [place] at this time but would like to be on hold for a few weeks in case another cord is palpable. At this point pt has met 2/3 Cancer Rehab goals reporting 90% improvement of Rt breast symptoms.    Rehab Potential Good   Clinical Impairments Affecting Rehab Potential previous radiation    PT Frequency 2x / week   PT Duration 4 weeks   PT Treatment/Interventions ADLs/Self Care Home Management;Cryotherapy;Functional mobility training;Stair training;Gait training;Moist Heat;Therapeutic  activities;Therapeutic exercise;Balance training;Neuromuscular re-education;Patient/family education;Passive range of motion;Manual lymph drainage;Manual techniques;Taping   PT Next Visit Plan Pt on hold for Cancer Rehab for about 2 weeks. D/C after that if have not heard from pt.    Consulted and Agree with Plan of Care Patient  Patient will benefit from skilled therapeutic intervention in order to improve the following deficits and impairments:  Increased muscle spasms, Decreased activity tolerance, Pain, Improper body mechanics, Hypermobility, Decreased strength, Increased edema, Difficulty walking, Decreased range of motion  Visit Diagnosis: Aftercare following surgery for neoplasm  Acute pain of right shoulder  Lymphedema, not elsewhere classified     Problem List Patient Active Problem List   Diagnosis Date Noted  . Dermatitis 06/08/2016  . Breast cancer of upper-outer quadrant of right female breast ([place]) 01/06/2016  . IBS (irritable bowel syndrome) 04/23/2013  . Chronic diarrhea 10/01/2012  . Anxiety 10/01/2012    [name], PTA 10/16/2016, 12:02 PM  [place], [place], [address] Phone: [phone]   Fax:  [phone]  Name: Allison Mcclure MRN: [id number] Date of Birth: 1976/05/14

## 2016-10-16 NOTE — Therapy (Addendum)
[place], [place], [address] Phone: [phone]   Fax:  [phone]  Physical Therapy Treatment/Discharge summary  Patient Details  Name: Allison Mcclure MRN: [id number] Date of Birth: Mar 21, 1976 Referring Provider: Kathyrn Lass, MD  Encounter Date: 10/16/2016      PT End of Session - 10/16/16 1204    Visit Number 7  7 for ortho   Number of Visits 18   Date for PT Re-Evaluation 10/20/16   Authorization Type [employer] 60 visit limit   PT Start Time 1150  pt arrived late due to immediate follow of another apt   PT Stop Time 1226   PT Time Calculation (min) 36 min   Activity Tolerance Patient tolerated treatment well   Behavior During Therapy [facility] for tasks assessed/performed      Past Medical History:  Diagnosis Date  . Anxiety   . Breast cancer ([place])   . Cervical dysplasia   . Gastrointestinal disorder   . GERD (gastroesophageal reflux disease)   . GERD (gastroesophageal reflux disease)   . IBS (irritable bowel syndrome)   . Ovarian cyst     Past Surgical History:  Procedure Laterality Date  . arm surgery     left  . BREAST LUMPECTOMY WITH RADIOACTIVE SEED AND SENTINEL LYMPH NODE BIOPSY Right 03/01/2016   Procedure: RIGHT BREAST LUMPECTOMY WITH RADIOACTIVE SEED AND SENTINEL LYMPH NODE BIOPSY;  Surgeon: Stark Klein, MD;  Location: [place];  Service: General;  Laterality: Right;  . CRYOTHERAPY    . EXCISION MASS LOWER EXTREMETIES Right 03/01/2016   Procedure: EXCISION MASS RIGHT THIGH;  Surgeon: Stark Klein, MD;  Location: [place];  Service: General;  Laterality: Right;  . MOUTH SURGERY     "gum surgery" x 2  . TONSILLECTOMY    . WISDOM TOOTH EXTRACTION      There were no vitals filed for this visit.      Subjective Assessment - 10/16/16 1232    Subjective Feeling better, still pop it every now and then, esp when I am doing stuff and don't think about  it. Some distal thigh discomfort just superior to patalla since lipoma has moved.    Currently in Pain? No/denies                         [facility] Adult PT Treatment/Exercise - 10/16/16 1211      Lumbar Exercises: Supine   Bent Knee Raise Limitations V-outs from table top, ball bw knees   Other Supine Lumbar Exercises V-ups with LE external rotaiton     Lumbar Exercises: Prone   Other Prone Lumbar Exercises knee/elbow plank ball bw ankles, standard planks                PT Education - 10/16/16 1233    Education provided Yes   Education Details exercise form/rationale, HEP   Person(s) Educated Patient   Methods Explanation;Demonstration;Tactile cues;Verbal cues;Handout   Comprehension Verbalized understanding;Returned demonstration;Verbal cues required;Tactile cues required;Need further instruction          PT Short Term Goals - 10/11/16 1313      PT SHORT TERM GOAL #1   Title Pt will be independent with HEP to improve long term stability in pelvis and SIJ by 2/2   Time 4   Period Weeks   Status Achieved     PT SHORT TERM GOAL #2   Title Pt will verbalize improved posture  during sleeping and sitting to avoid crossing legs   Time 4   Period Weeks   Status Achieved     PT SHORT TERM GOAL #3   Baseline mostly painfree   Time 4   Period Weeks   Status Achieved     PT SHORT TERM GOAL #4   Title Pt will present with equal leg length indicating improved stability and reduction of functional LLD   Time 4   Period Weeks   Status On-going              Long Term Clinic Goals - 10/16/16 1200      CC Long Term Goal  #1   Title Patient will be knowledgeable about self-care for right breast lymphedema including manual lymph drainage and use of compression.   Status Achieved     CC Long Term Goal  #2   Title Patient will report at least 50% decrease in discomfort in right breast/flank area   Baseline Pt reports this goal was met but feels not  as much progress this week as her swelling is very intermittent-09/13/16; pt reports 40% improved with pain; 90% improvement at this time-10/16/16   Status Achieved     CC Long Term Goal  #3   Title Pt. will report at least 60% decrease in perception of cording at right flank/lateral breast area   Baseline 40% improvement with lateral breast, but has new cord at inferior breast-10/16/16   Status On-going            Plan - 10/16/16 1229    Clinical Impression Statement focused on core strength to provide stability to SIJ and lumbar region. Pt has performed fewer cavitations but feels the need to when she is doing activities and loses focus on core indicating need for increased functional strength.    PT Next Visit Plan ortho: core strength, Discuss POC d/c?   PT Home Exercise Plan Supine on floor over towel roll for bil UE abduction and same in Lt S/L over towel roll, use of gluts to stand/sit, cord stretches, Vsits and v ups   Consulted and Agree with Plan of Care Patient      Patient will benefit from skilled therapeutic intervention in order to improve the following deficits and impairments:     Visit Diagnosis: Pain in left hip  Pain in left leg     Problem List Patient Active Problem List   Diagnosis Date Noted  . Dermatitis 06/08/2016  . Breast cancer of upper-outer quadrant of right female breast ([place]) 01/06/2016  . IBS (irritable bowel syndrome) 04/23/2013  . Chronic diarrhea 10/01/2012  . Anxiety 10/01/2012   Edilson Vital C. Elo Marmolejos PT, DPT 10/16/16 12:34 PM   [place] [facility] [address] [place], [place], [address] Phone: [phone]   Fax:  [phone]  Name: Allison Mcclure MRN: [id number] Date of Birth: 11-Jan-1976  PHYSICAL THERAPY DISCHARGE SUMMARY  Visits from Start of Care: 7  Current functional level related to goals / functional outcomes: See above   Remaining deficits: See above   Education  / Equipment: Anatomy of condition, POC, HEP, exercise form/rationale  Plan: Patient agrees to discharge.  Patient goals were partially met. Patient is being discharged due to a change in medical status.  ?????    Weylin Plagge C. Taiana Temkin PT, DPT 11/21/16 9:47 AM

## 2016-10-18 ENCOUNTER — Ambulatory Visit: Payer: Managed Care, Other (non HMO) | Admitting: Physical Therapy

## 2016-10-19 ENCOUNTER — Other Ambulatory Visit: Payer: Self-pay | Admitting: *Deleted

## 2016-10-19 MED ORDER — CEPHALEXIN 500 MG PO CAPS: 500.0000 mg | ORAL_CAPSULE | Freq: Two times a day (BID) | ORAL | 0 refills | Status: DC

## 2016-10-19 NOTE — Telephone Encounter (Signed)
Pt called with c/o red circular rash in her right breast at the inframammary fold. Developed 3 wks ago and "itches and is now painful". Discussed with Dr. Lindi Adie. Sent in prescription for abx for 1wk. Called pt with instructions on taking Keflex po bid. Informed pt to call back after completing abx if rash is still present and the next step will be mammogram and [place]. Received verbal understanding.

## 2016-10-20 LAB — CYP2D6-TAMOXIFEN EFFICACY

## 2016-10-23 ENCOUNTER — Ambulatory Visit (HOSPITAL_BASED_OUTPATIENT_CLINIC_OR_DEPARTMENT_OTHER): Payer: Managed Care, Other (non HMO)

## 2016-10-23 ENCOUNTER — Encounter: Payer: Self-pay | Admitting: Hematology and Oncology

## 2016-10-23 ENCOUNTER — Ambulatory Visit (HOSPITAL_BASED_OUTPATIENT_CLINIC_OR_DEPARTMENT_OTHER): Payer: Managed Care, Other (non HMO) | Admitting: Hematology and Oncology

## 2016-10-23 ENCOUNTER — Other Ambulatory Visit: Payer: Self-pay | Admitting: Emergency Medicine

## 2016-10-23 ENCOUNTER — Telehealth: Payer: Self-pay | Admitting: *Deleted

## 2016-10-23 VITALS — BP 121/69 | HR 91 | Temp 99.3°F | Resp 18 | Wt 119.0 lb

## 2016-10-23 DIAGNOSIS — N61 Mastitis without abscess: Secondary | ICD-10-CM | POA: Diagnosis not present

## 2016-10-23 DIAGNOSIS — C50411 Malignant neoplasm of upper-outer quadrant of right female breast: Secondary | ICD-10-CM

## 2016-10-23 DIAGNOSIS — Z170 Estrogen receptor positive status [ER+]: Principal | ICD-10-CM

## 2016-10-23 DIAGNOSIS — Z79810 Long term (current) use of selective estrogen receptor modulators (SERMs): Secondary | ICD-10-CM | POA: Diagnosis not present

## 2016-10-23 LAB — CBC WITH DIFFERENTIAL/PLATELET
BASO%: 0.2 % (ref 0.0–2.0)
BASOS ABS: 0 10*3/uL (ref 0.0–0.1)
EOS%: 5.1 % (ref 0.0–7.0)
Eosinophils Absolute: 0.3 10*3/uL (ref 0.0–0.5)
HEMATOCRIT: 44.2 % (ref 34.8–46.6)
HGB: 14.8 g/dL (ref 11.6–15.9)
LYMPH#: 1 10*3/uL (ref 0.9–3.3)
LYMPH%: 17.6 % (ref 14.0–49.7)
MCH: 32 pg (ref 25.1–34.0)
MCHC: 33.5 g/dL (ref 31.5–36.0)
MCV: 95.7 fL (ref 79.5–101.0)
MONO#: 0.6 10*3/uL (ref 0.1–0.9)
MONO%: 9.8 % (ref 0.0–14.0)
NEUT#: 3.8 10*3/uL (ref 1.5–6.5)
NEUT%: 67.3 % (ref 38.4–76.8)
PLATELETS: 239 10*3/uL (ref 145–400)
RBC: 4.62 10*6/uL (ref 3.70–5.45)
RDW: 12.2 % (ref 11.2–14.5)
WBC: 5.7 10*3/uL (ref 3.9–10.3)

## 2016-10-23 MED ORDER — METHYLPREDNISOLONE 4 MG PO TBPK: 8.0000 mg | ORAL_TABLET | Freq: Every evening | ORAL | Status: DC

## 2016-10-23 MED ORDER — METHYLPREDNISOLONE 4 MG PO TBPK: 8.0000 mg | ORAL_TABLET | Freq: Every morning | ORAL | Status: DC

## 2016-10-23 MED ORDER — METHYLPREDNISOLONE 4 MG PO TBPK: 4.0000 mg | ORAL_TABLET | Freq: Four times a day (QID) | ORAL | Status: DC

## 2016-10-23 MED ORDER — METHYLPREDNISOLONE 4 MG PO TBPK: ORAL_TABLET | ORAL | 0 refills | Status: DC

## 2016-10-23 MED ORDER — METHYLPREDNISOLONE 4 MG PO TBPK: 4.0000 mg | ORAL_TABLET | Freq: Three times a day (TID) | ORAL | Status: DC

## 2016-10-23 MED ORDER — METHYLPREDNISOLONE 4 MG PO TBPK: 4.0000 mg | ORAL_TABLET | ORAL | Status: DC

## 2016-10-23 NOTE — Progress Notes (Signed)
Patient Care Team: Kathyrn Lass, MD as PCP - General (Family Medicine)  DIAGNOSIS:  Encounter Diagnoses  Name Primary?  . Cellulitis of right breast Yes  . Malignant neoplasm of upper-outer quadrant of right breast in female, estrogen receptor positive ([name])     SUMMARY OF ONCOLOGIC HISTORY:   Breast cancer of upper-outer quadrant of right female breast ([place])   01/03/2016 Initial Diagnosis    Right breast biopsy: Invasive ductal carcinoma with LVI, grade 2,ER 100%, PR 100%, Ki-67 10%, HER-2 negative ratio 1.23      01/03/2016 Mammogram    screening detected asymmetry in the right breast ultrasound measured 8 mm at 10:00 position axillary normal, T1b N0 stage IA      03/01/2016 Surgery    Right lumpectomy [facility]): IDC grade 1, 0.7 cm, margins negative, 0/4 lymph nodes negative, ER 100%, PR 100%, Ki-67 10%, HER-2 negative ratio 1.32, T1 BN 0 stage IA, Oncotype DX 8, 6% ROR      03/01/2016 Oncotype testing    Recurrence score: 8; ROR 6% (low-risk)      05/02/2016 - 06/16/2016 Radiation Therapy    Adjuvant radiation therapy [name]). Right breast/ 50.4 Gy in 28 fx.  Boost / 10 Gy in 5 fx      07/2016 -  Anti-estrogen oral therapy    Tamoxifen 20 mg daily. Planned duration of therapy: 5-10 years        CHIEF COMPLIANT: Urgent visit for cellulitis right breast  INTERVAL HISTORY: Allison Mcclure is a 41 year old with above-mentioned history of right breast cancer treated with lumpectomy and adjuvant radiation therapy. She was on tamoxifen 20 mg daily and was unable to tolerate it because of emotional issues. 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 Because this would reduce her tamoxifen dose to 5 mg daily. She is calling in today complaining of redness of the breasts. When we had previous to her about her breast swelling and discomfort I put her on Keflex over the counter. It appears that the Keflex is not improving her symptoms. She is  here for an urgent visit regarding right breast swelling discomfort and redness and profound itching.  REVIEW OF SYSTEMS:   Constitutional: Denies fevers, chills or abnormal weight loss Eyes: Denies blurriness of vision Ears, nose, mouth, throat, and face: Denies mucositis or sore throat Respiratory: Denies cough, dyspnea or wheezes Cardiovascular: Denies palpitation, chest discomfort Gastrointestinal:  Denies nausea, heartburn or change in bowel habits Skin: Denies abnormal skin rashes Lymphatics: Denies new lymphadenopathy or easy bruising Neurological:Denies numbness, tingling or new weaknesses Behavioral/Psych: Mood is stable, no new changes  Extremities: No lower extremity edema Breast: Right breast swelling and redness All other systems were reviewed with the patient and are negative.  I have reviewed the past medical history, past surgical history, social history and family history with the patient and they are unchanged from previous note.  ALLERGIES:  is allergic to sunscreen spf30 [coppertone spf4].  MEDICATIONS:  Current Outpatient Prescriptions  Medication Sig Dispense Refill  . ALPRAZolam (XANAX) 0.5 MG tablet Take 1 tablet (0.5 mg total) by mouth 2 (two) times daily as needed for anxiety. 50 tablet 0  . chlordiazePOXIDE (LIBRIUM) 10 MG capsule Take 5 mg by mouth daily.     . cholecalciferol (VITAMIN D) 400 units TABS tablet Take 2,000 Units by mouth.    . etonogestrel-ethinyl estradiol (NUVARING) 0.12-0.015 MG/24HR vaginal ring Place 1 each vaginally every 28 (twenty-eight)  days. Insert vaginally and leave in place for 3 consecutive weeks, then remove for 1 week. 12 each 0  . loratadine (ALAVERT) 10 MG tablet Take 10 mg by mouth daily as needed.    . methylPREDNISolone (MEDROL DOSEPAK) 4 MG TBPK tablet As Directed. 21 tablet 0  . Multiple Minerals (CALCIUM-MAGNESIUM-ZINC) TABS Take by mouth 1 day or 1 dose.    . triamcinolone (NASACORT) 55 MCG/ACT AERO nasal inhaler Place  2 sprays into the nose as needed.     . venlafaxine XR (EFFEXOR-XR) 37.5 MG 24 hr capsule Take 1 capsule (37.5 mg total) by mouth daily with breakfast. 30 capsule 6   No current facility-administered medications for this visit.     PHYSICAL EXAMINATION: ECOG PERFORMANCE STATUS: 1 - Symptomatic but completely ambulatory  Vitals:   10/23/16 1110  BP: 121/69  Pulse: 91  Resp: 18  Temp: 99.3 F (37.4 C)   Filed Weights   10/23/16 1110  Weight: 119 lb (54 kg)    GENERAL:alert, no distress and comfortable SKIN: skin color, texture, turgor are normal, no rashes or significant lesions EYES: normal, Conjunctiva are pink and non-injected, sclera clear OROPHARYNX:no exudate, no erythema and lips, buccal mucosa, and tongue normal  NECK: supple, thyroid normal size, non-tender, without nodularity LYMPH:  no palpable lymphadenopathy in the cervical, axillary or inguinal LUNGS: clear to auscultation and percussion with normal breathing effort HEART: regular rate & rhythm and no murmurs and no lower extremity edema ABDOMEN:abdomen soft, non-tender and normal bowel sounds MUSCULOSKELETAL:no cyanosis of digits and no clubbing  NEURO: alert & oriented x 3 with fluent speech, no focal motor/sensory deficits EXTREMITIES: No lower extremity edema  LABORATORY DATA:  I have reviewed the data as listed   Chemistry      Component Value Date/Time   NA 140 01/19/2016 1227   K 3.7 01/19/2016 1227   CO2 23 01/19/2016 1227   BUN 15.1 01/19/2016 1227   CREATININE 0.9 01/19/2016 1227      Component Value Date/Time   CALCIUM 9.2 01/19/2016 1227   ALKPHOS 55 01/19/2016 1227   AST 13 01/19/2016 1227   ALT 12 01/19/2016 1227   BILITOT 0.36 01/19/2016 1227       Lab Results  Component Value Date   WBC 5.7 10/23/2016   HGB 14.8 10/23/2016   HCT 44.2 10/23/2016   MCV 95.7 10/23/2016   PLT 239 10/23/2016   NEUTROABS 3.8 10/23/2016    ASSESSMENT & PLAN:  Breast cancer of upper-outer quadrant  of right female breast ([place]) Right lumpectomy 03/01/2016: IDC grade 1, 0.7 cm, margins negative, 0/4 lymph nodes negative, ER 100%, PR 100%, Ki-67 10%, HER-2 negative ratio 1.32, T1 BN 0 stage IA Oncotype DX score 8: 6% risk of recurrence low risk Adjuvant radiation therapy from 05/02/2016 to09/22/2017  Treatment plan: Adjuvant antiestrogen therapy with tamoxifen 5 mg daily 5-10 years, started 07/19/2016  (homozygous for CYP2D6 null variant)  Inflammation of right breast It has not improved on Keflex but in fact her symptoms got markedly worse. She is itching profusely through the breast and into her abdominal area as well as the left axilla.I recommended that she receive Medrol Dosepak and use topical calamine lotion or Benadryl of Zofran.  I spent 25 minutes talking to the patient of which more than half was spent in counseling and coordination of care.  Orders Placed This Encounter  Procedures  . CBC with Differential    Standing Status:   Future    Number  of Occurrences:   1    Standing Expiration Date:   10/23/2017   The patient has a good understanding of the overall plan. she agrees with it. she will call with any problems that may develop before the next visit here.   Rulon Eisenmenger, MD 10/23/16

## 2016-10-23 NOTE — Assessment & Plan Note (Signed)
Right breast cellulitis: Not gotten better with Keflex over the counter. I will discuss with Dr. Barry Dienes regarding her management. I recommended that she received a dose of IV Rocephin and change antibiotic to Augmentin. If she does not improve then we may need to admit her to the hospital

## 2016-10-23 NOTE — Telephone Encounter (Signed)
Pt called with increased redness, swelling, and pain to her right breast. Relate it is "twice the size of my other breast and red allover". Scheduled and confirmed pt to see Dr. Lindi Adie on 10/23/16 at 1045. Discussed pt symptoms and complaints with Dr. Lindi Adie

## 2016-10-23 NOTE — Assessment & Plan Note (Addendum)
Right lumpectomy 03/01/2016: IDC grade 1, 0.7 cm, margins negative, 0/4 lymph nodes negative, ER 100%, PR 100%, Ki-67 10%, HER-2 negative ratio 1.32, T1 BN 0 stage IA Oncotype DX score 8: 6% risk of recurrence low risk Adjuvant radiation therapy from 05/02/2016 to09/22/2017  Treatment plan: Adjuvant antiestrogen therapy with tamoxifen 5 mg daily 5-10 years, started 07/19/2016 (homozygous for CYP2D6 null variant)

## 2016-10-25 ENCOUNTER — Other Ambulatory Visit: Payer: Self-pay | Admitting: Hematology and Oncology

## 2016-10-25 ENCOUNTER — Ambulatory Visit (HOSPITAL_BASED_OUTPATIENT_CLINIC_OR_DEPARTMENT_OTHER): Payer: Managed Care, Other (non HMO)

## 2016-10-25 DIAGNOSIS — T7840XA Allergy, unspecified, initial encounter: Secondary | ICD-10-CM

## 2016-10-25 DIAGNOSIS — L299 Pruritus, unspecified: Secondary | ICD-10-CM | POA: Diagnosis not present

## 2016-10-25 DIAGNOSIS — N61 Mastitis without abscess: Secondary | ICD-10-CM | POA: Diagnosis not present

## 2016-10-25 MED ORDER — METHYLPREDNISOLONE SODIUM SUCC 125 MG IJ SOLR
125.0000 mg | Freq: Once | INTRAMUSCULAR | Status: AC
  Administered 2016-10-25: 125 mg via INTRAVENOUS

## 2016-10-25 MED ORDER — METHYLPREDNISOLONE SODIUM SUCC 125 MG IJ SOLR
INTRAMUSCULAR | Status: AC
  Filled 2016-10-25: qty 2

## 2016-10-26 ENCOUNTER — Other Ambulatory Visit: Payer: Self-pay | Admitting: Emergency Medicine

## 2016-10-26 ENCOUNTER — Telehealth: Payer: Self-pay | Admitting: *Deleted

## 2016-10-26 DIAGNOSIS — C50411 Malignant neoplasm of upper-outer quadrant of right female breast: Secondary | ICD-10-CM

## 2016-10-26 DIAGNOSIS — Z170 Estrogen receptor positive status [ER+]: Principal | ICD-10-CM

## 2016-10-26 NOTE — Telephone Encounter (Signed)
Pt called with c/o itching and redness to her right breast and would like to discuss receiving another infusion of solumedrol. Relate symptoms improved after the solumedrol infusion received on 10/25/16. Discussed with Dr. Lindi Adie. Recommend solumedrol infusion and dermatology referral. Pt will call her established dermatologist for an appt. Gave pt appt date and time for infusion on 10/27/16 at 1pm.

## 2016-10-27 ENCOUNTER — Ambulatory Visit (HOSPITAL_BASED_OUTPATIENT_CLINIC_OR_DEPARTMENT_OTHER): Payer: Managed Care, Other (non HMO)

## 2016-10-27 ENCOUNTER — Encounter: Payer: Self-pay | Admitting: Emergency Medicine

## 2016-10-27 DIAGNOSIS — L299 Pruritus, unspecified: Secondary | ICD-10-CM | POA: Diagnosis not present

## 2016-10-27 DIAGNOSIS — N61 Mastitis without abscess: Secondary | ICD-10-CM | POA: Diagnosis not present

## 2016-10-27 DIAGNOSIS — C50411 Malignant neoplasm of upper-outer quadrant of right female breast: Secondary | ICD-10-CM

## 2016-10-27 DIAGNOSIS — Z170 Estrogen receptor positive status [ER+]: Principal | ICD-10-CM

## 2016-10-27 MED ORDER — METHYLPREDNISOLONE SODIUM SUCC 125 MG IJ SOLR
INTRAMUSCULAR | Status: AC
  Filled 2016-10-27: qty 2

## 2016-10-27 MED ORDER — METHYLPREDNISOLONE SODIUM SUCC 125 MG IJ SOLR
125.0000 mg | Freq: Once | INTRAMUSCULAR | Status: AC
  Administered 2016-10-27: 125 mg via INTRAVENOUS

## 2016-10-27 NOTE — Patient Instructions (Signed)
Methylprednisolone Solution for Injection What is this medicine? METHYLPREDNISOLONE (meth ill pred NISS oh lone) is a corticosteroid. It is commonly used to treat inflammation of the skin, joints, lungs, and other organs. Common conditions treated include asthma, allergies, and arthritis. It is also used for other conditions, such as blood disorders and diseases of the adrenal glands. This medicine may be used for other purposes; ask your health care provider or pharmacist if you have questions. COMMON BRAND NAME(S): A-Methapred, Solu-Medrol What should I tell my health care provider before I take this medicine? They need to know if you have any of these conditions: -Cushing's syndrome -eye disease, vision problems -diabetes -glaucoma -heart disease -high blood pressure -infection (especially a virus infection such as chickenpox, cold sores, or herpes) -liver disease -mental illness -myasthenia gravis -osteoporosis -recently received or scheduled to receive a vaccine -seizures -stomach or intestine problems -thyroid disease -an unusual or allergic reaction to lactose, methylprednisolone, other medicines, foods, dyes, or preservatives -pregnant or trying to get pregnant -breast-feeding How should I use this medicine? This medicine is for injection or infusion into a vein. It is also for injection into a muscle. It is given by a health care professional in a hospital or clinic setting. Talk to your pediatrician regarding the use of this medicine in children. While this drug may be prescribed for selected conditions, precautions do apply. Overdosage: If you think you have taken too much of this medicine contact a poison control center or emergency room at once. NOTE: This medicine is only for you. Do not share this medicine with others. What if I miss a dose? This does not apply. What may interact with this medicine? Do not take this medicine with any of the following  medications: -alefacept -echinacea -iopamidol -live virus vaccines -metyrapone -mifepristone This medicine may also interact with the following medications: -amphotericin B -aspirin and aspirin-like medicines -certain antibiotics like erythromycin, clarithromycin, troleandomycin -certain medicines for diabetes -certain medicines for fungal infection like ketoconazole -certain medicines for seizures like carbamazepine, phenobarbital, phenytoin -certain medicines that treat or prevent blood clots like warfarin -cyclosporine -digoxin -diuretics -female hormones, like estrogens and birth control pills -isoniazid -NSAIDS, medicines for pain and inflammation, like ibuprofen or naproxen -other medicines for myasthenia gravis -rifampin -vaccines This list may not describe all possible interactions. Give your health care provider a list of all the medicines, herbs, non-prescription drugs, or dietary supplements you use. Also tell them if you smoke, drink alcohol, or use illegal drugs. Some items may interact with your medicine. What should I watch for while using this medicine? Tell your doctor or healthcare professional if your symptoms do not start to get better or if they get worse. Do not stop taking except on your doctor's advice. You may develop a severe reaction. Your doctor will tell you how much medicine to take. Your condition will be monitored carefully while you are receiving this medicine. This medicine may increase your risk of getting an infection. Tell your doctor or health care professional if you are around anyone with measles or chickenpox, or if you develop sores or blisters that do not heal properly. This medicine may affect blood sugar levels. If you have diabetes, check with your doctor or health care professional before you change your diet or the dose of your diabetic medicine. Tell your doctor or health care professional right away if you have any change in your  eyesight. Using this medicine for a long time may increase your risk of low bone   mass. Talk to your doctor about bone health. What side effects may I notice from receiving this medicine? Side effects that you should report to your doctor or health care professional as soon as possible: -allergic reactions like skin rash, itching or hives, swelling of the face, lips, or tongue -bloody or tarry stools -changes in vision -hallucination, loss of contact with reality -muscle cramps -muscle pain -palpitations -signs and symptoms of high blood sugar such as dizziness; dry mouth; dry skin; fruity breath; nausea; stomach pain; increased hunger or thirst; increased urination -signs and symptoms of infection like fever or chills; cough; sore throat; pain or trouble passing urine -trouble passing urine or change in the amount of urine Side effects that usually do not require medical attention (report to your doctor or health care professional if they continue or are bothersome): -changes in emotions or mood -constipation -diarrhea -excessive hair growth on the face or body -headache -nausea, vomiting -pain, redness, or irritation at site where injected -trouble sleeping -weight gain This list may not describe all possible side effects. Call your doctor for medical advice about side effects. You may report side effects to FDA at 1-800-FDA-1088. Where should I keep my medicine? This drug is given in a hospital or clinic and will not be stored at home. NOTE: This sheet is a summary. It may not cover all possible information. If you have questions about this medicine, talk to your doctor, pharmacist, or health care provider.  2017 Elsevier/Gold Standard (2015-11-11 16:21:28)

## 2016-10-30 ENCOUNTER — Emergency Department (HOSPITAL_BASED_OUTPATIENT_CLINIC_OR_DEPARTMENT_OTHER)
Admission: EM | Admit: 2016-10-30 | Discharge: 2016-10-30 | Disposition: A | Discharge status: Home / Self Care | Payer: Managed Care, Other (non HMO) | Attending: Emergency Medicine | Admitting: Emergency Medicine

## 2016-10-30 ENCOUNTER — Ambulatory Visit: Payer: Managed Care, Other (non HMO) | Admitting: Hematology and Oncology

## 2016-10-30 ENCOUNTER — Encounter (HOSPITAL_BASED_OUTPATIENT_CLINIC_OR_DEPARTMENT_OTHER): Payer: Self-pay | Admitting: *Deleted

## 2016-10-30 DIAGNOSIS — Z853 Personal history of malignant neoplasm of breast: Secondary | ICD-10-CM | POA: Insufficient documentation

## 2016-10-30 DIAGNOSIS — L589 Radiodermatitis, unspecified: Secondary | ICD-10-CM

## 2016-10-30 DIAGNOSIS — R21 Rash and other nonspecific skin eruption: Secondary | ICD-10-CM | POA: Diagnosis present

## 2016-10-30 DIAGNOSIS — W888XXA Exposure to other ionizing radiation, initial encounter: Secondary | ICD-10-CM | POA: Diagnosis not present

## 2016-10-30 DIAGNOSIS — Z79899 Other long term (current) drug therapy: Secondary | ICD-10-CM | POA: Insufficient documentation

## 2016-10-30 MED ORDER — SODIUM CHLORIDE 0.9 % IV BOLUS (SEPSIS)
1000.0000 mL | Freq: Once | INTRAVENOUS | Status: AC
  Administered 2016-10-30: 1000 mL via INTRAVENOUS

## 2016-10-30 MED ORDER — HYDROXYZINE HCL 25 MG PO TABS: 25.0000 mg | ORAL_TABLET | Freq: Four times a day (QID) | ORAL | 0 refills | Status: DC | PRN

## 2016-10-30 MED ORDER — DIPHENHYDRAMINE HCL 50 MG/ML IJ SOLN
25.0000 mg | Freq: Once | INTRAMUSCULAR | Status: AC
  Administered 2016-10-30: 25 mg via INTRAVENOUS
  Filled 2016-10-30: qty 1

## 2016-10-30 MED ORDER — FAMOTIDINE 20 MG PO TABS: 20.0000 mg | ORAL_TABLET | Freq: Two times a day (BID) | ORAL | 0 refills | Status: DC

## 2016-10-30 MED ORDER — FAMOTIDINE IN NACL 20-0.9 MG/50ML-% IV SOLN
20.0000 mg | Freq: Once | INTRAVENOUS | Status: AC
  Administered 2016-10-30: 20 mg via INTRAVENOUS
  Filled 2016-10-30: qty 50

## 2016-10-30 MED FILL — FAMOTIDINE 20 MG TABLET: 20 | 5 days supply | Qty: 10 | Fill #0

## 2016-10-30 MED FILL — hydrOXYzine HCL 25 MG TABS: 25 | 7 days supply | Qty: 30 | Fill #0

## 2016-10-30 NOTE — ED Triage Notes (Addendum)
Pt started on keflex 2 weeks ago and had full body rash after one or two doses. Also had swelling to right breast in area of radiation treatment from last fall. Has seen PCP and dermatologist for same. Was treated with steroids with some relief but now rash is worse. Has biopsy pending from rash but here today for management of symptoms. Started on doxycycline yesterday for possible cellulitis

## 2016-10-30 NOTE — Discharge Instructions (Signed)
Return here as needed. Follow up with your doctor. °

## 2016-10-31 ENCOUNTER — Telehealth: Payer: Self-pay | Admitting: *Deleted

## 2016-10-31 NOTE — ED Provider Notes (Signed)
[place] [facility] Provider Note   CSN: [id number] Arrival date & time: 10/30/16  0941     History   Chief Complaint Chief Complaint  Patient presents with  . Rash    HPI Allison Mcclure is a 41 y.o. female.  HPI Patient presents to the emergency department with rash around the right breast and redness surrounding the entire breast.  Patient states she has rash over multiple areas including the back and extremities.  Patient was placed on antibiotics for possible cellulitisThe patient denies chest pain, shortness of breath, headache,blurred vision, neck pain, fever, cough, weakness, numbness, dizziness, anorexia, edema, abdominal pain, nausea, vomiting, diarrhea,  back pain, dysuria, hematemesis, bloody stool, near syncope, or syncope. Past Medical History:  Diagnosis Date  . Anxiety   . Breast cancer ([place])   . Cervical dysplasia   . Gastrointestinal disorder   . GERD (gastroesophageal reflux disease)   . GERD (gastroesophageal reflux disease)   . IBS (irritable bowel syndrome)   . Ovarian cyst     Patient Active Problem List   Diagnosis Date Noted  . Cellulitis of right breast 10/23/2016  . Dermatitis 06/08/2016  . Breast cancer of upper-outer quadrant of right female breast ([place]) 01/06/2016  . IBS (irritable bowel syndrome) 04/23/2013  . Chronic diarrhea 10/01/2012  . Anxiety 10/01/2012    Past Surgical History:  Procedure Laterality Date  . arm surgery     left  . BREAST LUMPECTOMY WITH RADIOACTIVE SEED AND SENTINEL LYMPH NODE BIOPSY Right 03/01/2016   Procedure: RIGHT BREAST LUMPECTOMY WITH RADIOACTIVE SEED AND SENTINEL LYMPH NODE BIOPSY;  Surgeon: Stark Klein, MD;  Location: [place];  Service: General;  Laterality: Right;  . CRYOTHERAPY    . EXCISION MASS LOWER EXTREMETIES Right 03/01/2016   Procedure: EXCISION MASS RIGHT THIGH;  Surgeon: Stark Klein, MD;  Location: [place];  Service: General;  Laterality: Right;  .  MOUTH SURGERY     "gum surgery" x 2  . TONSILLECTOMY    . WISDOM TOOTH EXTRACTION      OB History    No data available       Home Medications    Prior to Admission medications   Medication Sig Start Date End Date Taking? Authorizing Provider  ALPRAZolam Duanne Moron) 0.5 MG tablet Take 1 tablet (0.5 mg total) by mouth 2 (two) times daily as needed for anxiety. 08/21/16  Yes Holley Bouche, NP  chlordiazePOXIDE (LIBRIUM) 10 MG capsule Take 5 mg by mouth daily.    Yes Historical Provider, MD  etonogestrel-ethinyl estradiol (NUVARING) 0.12-0.015 MG/24HR vaginal ring Place 1 each vaginally every 28 (twenty-eight) days. Insert vaginally and leave in place for 3 consecutive weeks, then remove for 1 week. 09/22/16  Yes Nicholas Lose, MD  venlafaxine XR (EFFEXOR-XR) 37.5 MG 24 hr capsule Take 1 capsule (37.5 mg total) by mouth daily with breakfast. 09/22/16  Yes Nicholas Lose, MD  cholecalciferol (VITAMIN D) 400 units TABS tablet Take 2,000 Units by mouth.    Historical Provider, MD  famotidine (PEPCID) 20 MG tablet Take 1 tablet (20 mg total) by mouth 2 (two) times daily. 10/30/16   Dalia Heading, PA-C  hydrOXYzine (ATARAX/VISTARIL) 25 MG tablet Take 1 tablet (25 mg total) by mouth every 6 (six) hours as needed for itching. 10/30/16   Dalia Heading, PA-C  loratadine (ALAVERT) 10 MG tablet Take 10 mg by mouth daily as needed.    Historical Provider, MD  methylPREDNISolone (MEDROL DOSEPAK) 4 MG TBPK tablet  As Directed. 10/23/16   Nicholas Lose, MD  Multiple Minerals (CALCIUM-MAGNESIUM-ZINC) TABS Take by mouth 1 day or 1 dose.    Historical Provider, MD  triamcinolone (NASACORT) 55 MCG/ACT AERO nasal inhaler Place 2 sprays into the nose as needed.     Historical Provider, MD    Family History Family History  Problem Relation Age of Onset  . Hyperlipidemia Father   . Prostate cancer Father   . Hypertension Father   . Hypertension Mother   . Hyperlipidemia Mother   . Colon cancer Paternal  Grandfather     Social History Social History  Substance Use Topics  . Smoking status: Never Smoker  . Smokeless tobacco: Never Used  . Alcohol use Yes     Comment: 0-1 drinks daily     Allergies   Sunscreen spf30 [ray block sunscreen] and Keflex [cephalexin]   Review of Systems Review of Systems  All other systems negative except as documented in the HPI. All pertinent positives and negatives as reviewed in the HPI. Physical Exam Updated Vital Signs BP 118/89 (BP Location: Left Wrist)   Pulse 71   Temp 98.4 F (36.9 C) (Oral)   Resp 16   Ht 5\' 2"  (1.575 m)   Wt 53.9 kg   SpO2 100%   BMI 21.75 kg/m   Physical Exam  Constitutional: She is oriented to person, place, and time. She appears well-developed and well-nourished. No distress.  HENT:  Head: Normocephalic and atraumatic.  Mouth/Throat: Oropharynx is clear and moist.  Eyes: Pupils are equal, round, and reactive to light.  Neck: Normal range of motion. Neck supple.  Cardiovascular: Normal rate, regular rhythm and normal heart sounds.  Exam reveals no gallop and no friction rub.   No murmur heard. Pulmonary/Chest: Effort normal and breath sounds normal. No respiratory distress. She has no wheezes.    Abdominal: Soft. Bowel sounds are normal. She exhibits no distension. There is no tenderness.  Neurological: She is alert and oriented to person, place, and time. She exhibits normal muscle tone. Coordination normal.  Skin: Skin is warm and dry. Capillary refill takes less than 2 seconds. No rash noted. No erythema.     Psychiatric: She has a normal mood and affect. Her behavior is normal.  Nursing note and vitals reviewed.    ED Treatments / Results  Labs (all labs ordered are listed, but only abnormal results are displayed) Labs Reviewed - No data to display  EKG  EKG Interpretation None       Radiology No results found.  Procedures Procedures (including critical care time)  Medications  Ordered in ED Medications  sodium chloride 0.9 % bolus 1,000 mL (0 mLs Intravenous Stopped 10/30/16 1253)  famotidine (PEPCID) IVPB 20 mg premix (0 mg Intravenous Stopped 10/30/16 1206)  diphenhydrAMINE (BENADRYL) injection 25 mg (25 mg Intravenous Given 10/30/16 1134)     Initial Impression / Assessment and Plan / ED Course  I have reviewed the triage vital signs and the nursing notes.  Pertinent labs & imaging results that were available during my care of the patient were reviewed by me and considered in my medical decision making (see chart for details).     The patient states her oncologist feels this is radiation recall dermatitis.  This pattern does not fit that as a diagnosis.  Patient is advised to return here as needed.  Patient agrees the plan and all questions were answered.  Patient mainly would like something for the itching, advised to follow-up  with her dermatologist and her primary doctor  Final Clinical Impressions(s) / ED Diagnoses   Final diagnoses:  Radiation dermatitis    New Prescriptions Discharge Medication List as of 10/30/2016  1:06 PM    START taking these medications   Details  famotidine (PEPCID) 20 MG tablet Take 1 tablet (20 mg total) by mouth 2 (two) times daily., Starting Mon 10/30/2016, Print    hydrOXYzine (ATARAX/VISTARIL) 25 MG tablet Take 1 tablet (25 mg total) by mouth every 6 (six) hours as needed for itching., Starting Mon 10/30/2016, Print         Dalia Heading, PA-C 11/03/16 [place], DO 11/03/16 [phone]

## 2016-10-31 NOTE — Telephone Encounter (Signed)
Pt called to relate she was in ED on 10/30/16 d/t rash became worse. Pt f/u with dermatologist and was recommenned to stop Effexor since she started it and within 2 wks she developed rash. Per Dr. Lindi Adie pt to taper Effexor by taking every other day x 1wk then stopping.  Gave pt instructions. Denies further questions at this time.

## 2016-11-22 DIAGNOSIS — Z170 Estrogen receptor positive status [ER+]: Secondary | ICD-10-CM

## 2016-11-22 DIAGNOSIS — C50411 Malignant neoplasm of upper-outer quadrant of right female breast: Secondary | ICD-10-CM | POA: Insufficient documentation

## 2016-11-23 ENCOUNTER — Telehealth: Payer: Self-pay

## 2016-11-23 NOTE — Telephone Encounter (Signed)
Pt would like results of genetic screening. She would also like to discuss alternative methods of birth control.

## 2016-11-23 NOTE — Telephone Encounter (Signed)
Pt is calling to see if we can send the genetic test results to another doctor and to discuss alternative methods for birth control.

## 2016-11-27 NOTE — Telephone Encounter (Signed)
LMTRC

## 2016-12-05 ENCOUNTER — Telehealth: Payer: Self-pay

## 2016-12-05 DIAGNOSIS — Z30430 Encounter for insertion of intrauterine contraceptive device: Secondary | ICD-10-CM

## 2016-12-05 MED ORDER — MISOPROSTOL 100 MCG PO TABS: 100.0000 ug | ORAL_TABLET | Freq: Once | ORAL | 0 refills | Status: DC

## 2016-12-05 NOTE — Telephone Encounter (Signed)
Spoke with pt re: BC. She is s/p breast cancer, ER/PR+. Her oncologist put her on nuvaring which I questioned all along. She saw a different oncologist in [place] after having side effects from tamoxifen and they were concerned that she was on nuvaring. They recommended paragard (only option for pt). She is having monthly menses still. She tried to have a Mirena in the past but couldn't tolerate insertion (was not done on menses). Pt will RTO next wk with menses for paragard insertion. Cytotec eRxd/NSAIDs 1 hr before appt.

## 2016-12-05 NOTE — Telephone Encounter (Signed)
Pt called stating (it sounded like) [facility] had called back about bc options.  Pt wants to talk to [facility] before she schedules appt.  [phone].

## 2016-12-05 NOTE — Telephone Encounter (Signed)
Pt going to have Paragard inserted  12/04/16.

## 2016-12-06 NOTE — Telephone Encounter (Signed)
Noted. Will order Paragard to arrive by apt date & time (12/14/16 @9 :50)

## 2016-12-08 ENCOUNTER — Telehealth: Payer: Self-pay

## 2016-12-08 NOTE — Telephone Encounter (Signed)
Pt called stating she is sched. For IUD insertion next Thurs.  However, she started her period yesterday and won't be on her period next Thurs.  Does she need to change her appt?  Also she wanted to ask if you would rx lexapro for her.  She saw her therapist today.  He doesn't rx anything.  Her oncologist sugg she ask you for it. She said you know her story.  Pt aware you are out of office today and will be back Mon.  [phone]

## 2016-12-09 ENCOUNTER — Other Ambulatory Visit: Payer: Self-pay | Admitting: Obstetrics and Gynecology

## 2016-12-09 MED ORDER — ESCITALOPRAM OXALATE 10 MG PO TABS: 10.0000 mg | ORAL_TABLET | Freq: Every day | ORAL | 1 refills | Status: DC

## 2016-12-09 NOTE — Telephone Encounter (Signed)
Will she still be bleeding Tuesday? If so, I can see her at 4:20. If not, she needs to schedule with next menses due to difficulty of trying to insert in the past.  Rx lexapro also eRxd.

## 2016-12-11 NOTE — Telephone Encounter (Signed)
Please call patient and schedule appointment according to her cycle/ABC message. KJ CMA

## 2016-12-11 NOTE — Telephone Encounter (Signed)
Ptis schedule with Elmo Putt Copland 11/05/26 for Paraguard insertion

## 2016-12-11 NOTE — Telephone Encounter (Signed)
Per Allison Mcclure Pt is to be worked in on 12/12/16 at 4:30 for an paraguard insertion

## 2016-12-12 ENCOUNTER — Ambulatory Visit: Payer: Self-pay | Admitting: Obstetrics and Gynecology

## 2016-12-14 ENCOUNTER — Ambulatory Visit: Payer: Self-pay | Admitting: Obstetrics and Gynecology

## 2016-12-19 NOTE — Telephone Encounter (Signed)
Pt was r/s to 12/12/16 per ABC, but had to cancel d/t unable to get off work. Pt to call back with next menses.

## 2016-12-19 NOTE — Telephone Encounter (Signed)
Pt had to cancel d/t unable to get off work. Will r/s with next menses.

## 2016-12-25 ENCOUNTER — Ambulatory Visit: Payer: Managed Care, Other (non HMO) | Admitting: Hematology and Oncology

## 2016-12-25 ENCOUNTER — Ambulatory Visit: Payer: Self-pay | Admitting: Obstetrics and Gynecology

## 2016-12-27 ENCOUNTER — Ambulatory Visit: Payer: Self-pay | Admitting: Obstetrics and Gynecology

## 2017-04-20 ENCOUNTER — Encounter: Payer: Self-pay | Admitting: Vascular Surgery

## 2017-04-23 ENCOUNTER — Encounter: Payer: Self-pay | Admitting: Vascular Surgery

## 2017-04-23 ENCOUNTER — Ambulatory Visit (INDEPENDENT_AMBULATORY_CARE_PROVIDER_SITE_OTHER): Payer: Managed Care, Other (non HMO) | Admitting: Vascular Surgery

## 2017-04-23 VITALS — BP 123/70 | HR 82 | Temp 97.8°F | Resp 18 | Ht 61.5 in | Wt 128.5 lb

## 2017-04-23 DIAGNOSIS — I83892 Varicose veins of left lower extremities with other complications: Secondary | ICD-10-CM | POA: Diagnosis not present

## 2017-04-23 NOTE — Progress Notes (Signed)
Subjective:     Patient ID: Allison Mcclure, female   DOB: 12/22/1975, 41 y.o.   MRN: [id number]  HPI This 41 year old female was referred by Dr. Kathyrn Lass for left calf swelling and discomfort. She has had intermittent left calf swelling over the past few years. She has no history of DVT thrombophlebitis stasis ulcers or bleeding. She had an ultrasound performed in December 2017 which I have reviewed and reveals no evidence of deep venous obstruction or reflux. 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 She has no symptoms and contralateral right leg.  Past Medical History:  Diagnosis Date  . Anxiety   . Breast cancer ([place])   . Cervical dysplasia   . Gastrointestinal disorder   . GERD (gastroesophageal reflux disease)   . GERD (gastroesophageal reflux disease)   . IBS (irritable bowel syndrome)   . Ovarian cyst     Social History  Substance Use Topics  . Smoking status: Never Smoker  . Smokeless tobacco: Never Used  . Alcohol use Yes     Comment: 0-1 drinks daily    Family History  Problem Relation Age of Onset  . Hyperlipidemia Father   . Prostate cancer Father   . Hypertension Father   . Hypertension Mother   . Hyperlipidemia Mother   . Colon cancer Paternal Grandfather     Allergies  Allergen Reactions  . Sunscreen Spf30 [Solbar Pf Spf15] Hives, Itching and Rash  . Keflex [Cephalexin] Rash     Current Outpatient Prescriptions:  .  ALPRAZolam (XANAX) 0.5 MG tablet, Take 1 tablet (0.5 mg total) by mouth 2 (two) times daily as needed for anxiety., Disp: 50 tablet, Rfl: 0 .  chlordiazePOXIDE (LIBRIUM) 10 MG capsule, Take 5 mg by mouth daily. , Disp: , Rfl:  .  cholecalciferol (VITAMIN D) 400 units TABS tablet, Take 2,000 Units by mouth., Disp: , Rfl:  .  etonogestrel-ethinyl estradiol  (NUVARING) 0.12-0.015 MG/24HR vaginal ring, Place 1 each vaginally every 28 (twenty-eight) days. Insert vaginally and leave in place for 3 consecutive weeks, then remove for 1 week., Disp: 12 each, Rfl: 0 .  loratadine (ALAVERT) 10 MG tablet, Take 10 mg by mouth daily as needed., Disp: , Rfl:  .  omeprazole (PRILOSEC) 20 MG capsule, Take 20 mg by mouth daily., Disp: , Rfl:  .  triamcinolone (NASACORT) 55 MCG/ACT AERO nasal inhaler, Place 2 sprays into the nose as needed. , Disp: , Rfl:  .  escitalopram (LEXAPRO) 10 MG tablet, Take 1 tablet (10 mg total) by mouth daily. Take 1/2 tab daily for 6 days then 1 tab daily (Patient not taking: Reported on 04/23/2017), Disp: 30 tablet, Rfl: 1 .  famotidine (PEPCID) 20 MG tablet, Take 1 tablet (20 mg total) by mouth 2 (two) times daily. (Patient not taking: Reported on 04/23/2017), Disp: 10 tablet, Rfl: 0 .  hydrOXYzine (ATARAX/VISTARIL) 25 MG tablet, Take 1 tablet (25 mg total) by mouth every 6 (six) hours as needed for itching. (Patient not taking: Reported on 04/23/2017), Disp: 30 tablet, Rfl: 0 .  methylPREDNISolone (MEDROL DOSEPAK) 4 MG TBPK tablet, As Directed. (Patient not taking: Reported on 04/23/2017), Disp: 21 tablet, Rfl: 0 .  misoprostol (CYTOTEC) 100 MCG tablet, Take 1 tablet (100 mcg total) by mouth once. 1 hour before appt, Disp: 1  tablet, Rfl: 0 .  Multiple Minerals (CALCIUM-MAGNESIUM-ZINC) TABS, Take by mouth 1 day or 1 dose., Disp: , Rfl:  .  venlafaxine XR (EFFEXOR-XR) 37.5 MG 24 hr capsule, Take 1 capsule (37.5 mg total) by mouth daily with breakfast. (Patient not taking: Reported on 04/23/2017), Disp: 30 capsule, Rfl: 6  Vitals:   04/23/17 1545  BP: 123/70  Pulse: 82  Resp: 18  Temp: 97.8 F (36.6 C)  TempSrc: Oral  SpO2: 100%  Weight: 128 lb 8 oz (58.3 kg)  Height: 5' 1.5" (1.562 m)    Body mass index is 23.89 kg/m.         Review of Systems Denies chest pain, dyspnea on exertion, PND, orthopnea, hemoptysis. Patient does have  history of breast cancer treated with lumpectomy and radiation. See history of present illness.    Objective:   Physical Exam BP 123/70 (BP Location: Left Arm, Patient Position: Sitting, Cuff Size: Normal)   Pulse 82   Temp 97.8 F (36.6 C) (Oral)   Resp 18   Ht 5' 1.5" (1.562 m)   Wt 128 lb 8 oz (58.3 kg)   SpO2 100%   BMI 23.89 kg/m     Gen.-alert and oriented x3 in no apparent distress HEENT normal for age Lungs no rhonchi or wheezing Cardiovascular regular rhythm no murmurs carotid pulses 3+ palpable no bruits audible Abdomen soft nontender no palpable masses Musculoskeletal free of  major deformities Skin clear -no rashes Neurologic normal Lower extremities 3+ femoral and dorsalis pedis pulses palpable bilaterally with no edema No bulging varicosities, spider veins, hyperpigmentation, or chronic edema noted on physical exam. Both feet and legs are pink and well perfused.  Today I performed a bedside sono site ultrasound exam and visualized bilateral great saphenous veins which are normal in caliber with no evidence of reflux  Also reviewed the results of the sound performed December 2017 which did reveal no DVT or deep venous reflux or obstruction.      Assessment:     #1 intermittent swelling left leg-etiology unknown. No arterial or venous abnormalities found on testing or physical exam #2 intermittent left leg discomfort-etiology unknown-patient has history of SI joint problem #3 history of breast cancer treated with lumpectomy #4 history of diagnosis of lipoma left anterior thigh    Plan:     No indication for any arterial or venous intervention as studies are normal and physical exam is unremarkable Instructed patient regarding elevation of foot of bed and short leg elastic compression stockings that she should choose to do this Anterior thigh discomfort could be related to the lipoma although it is not very impressive on physical exam She was instructed if this  continues she should see general surgeon for evaluation

## 2017-07-26 ENCOUNTER — Other Ambulatory Visit: Payer: Self-pay | Admitting: Hematology and Oncology

## 2017-07-27 ENCOUNTER — Other Ambulatory Visit: Payer: Self-pay

## 2017-07-27 MED ORDER — VENLAFAXINE HCL ER 37.5 MG PO CP24: 37.5000 mg | ORAL_CAPSULE | Freq: Every day | ORAL | 6 refills | Status: DC

## 2017-08-01 ENCOUNTER — Telehealth: Payer: Self-pay | Admitting: Internal Medicine

## 2017-08-01 NOTE — Telephone Encounter (Signed)
Pt states she had BRB in her stool yesterday and she is concerned. Pt scheduled to see Dr. Hilarie Fredrickson Monday 08/06/17@9am . Pt aware of appt.

## 2017-08-01 NOTE — Telephone Encounter (Signed)
Left message for pt to call back  °

## 2017-08-06 ENCOUNTER — Encounter: Payer: Self-pay | Admitting: *Deleted

## 2017-08-06 ENCOUNTER — Ambulatory Visit: Payer: Managed Care, Other (non HMO) | Admitting: Internal Medicine

## 2017-08-06 VITALS — BP 108/76 | HR 74 | Ht 62.0 in | Wt 127.0 lb

## 2017-08-06 DIAGNOSIS — K625 Hemorrhage of anus and rectum: Secondary | ICD-10-CM

## 2017-08-06 DIAGNOSIS — K602 Anal fissure, unspecified: Secondary | ICD-10-CM | POA: Diagnosis not present

## 2017-08-06 DIAGNOSIS — K219 Gastro-esophageal reflux disease without esophagitis: Secondary | ICD-10-CM

## 2017-08-06 MED ORDER — HYDROCORTISONE ACETATE 25 MG RE SUPP: 25.0000 mg | Freq: Every day | RECTAL | 0 refills | Status: DC

## 2017-08-06 MED ORDER — RANITIDINE HCL 150 MG PO TABS: 150.0000 mg | ORAL_TABLET | Freq: Two times a day (BID) | ORAL | 0 refills | Status: DC | PRN

## 2017-08-06 NOTE — Progress Notes (Signed)
Subjective:    Patient ID: Allison Mcclure, female    DOB: May 08, 1976, 41 y.o.   MRN: [id number]  HPI Allison Mcclure is a 41 year old female with history of IBS, GERD, anal fissure who is here for follow-up.  She also has a history of breast cancer status post lumpectomy with lymph node dissection, radiation therapy on tamoxifen briefly.  Earlier this year she had a radiation recall type phenomenon after having completed radiation for about 6 months.  She had a painful rash develop over her radiation site.  She was treated with prednisone.  She was also found to be an under metabolizer of tamoxifen and this had led to lymphedema and cording.  She has been off of tamoxifen but is considering starting back at a much lower dose.  Also in the last few months she has been started on venlafaxine at low-dose, 37.5 mg daily for pain associated with her radiation treatments.  It has helped the pain significantly.  He reports that in the last year she has had 2 isolated episodes of painless rectal bleeding.  This occurred about 1 week ago.  She saw red blood on the stool and some small clots.  She also saw blood with wiping.  Similar episode occurred some months ago.  She reports she has been having 1-2 stools per day though at times she can feel constipated and skip a day.  This can lead to a fullness type sensation.  At times her stools can be hard.  She denies incomplete evacuation feeling.  She was treated for anal fissure by GYN a couple years ago with what sounds like diltiazem gel.  He has been using Prilosec but not on a consistent basis.  May be 4-5 days/week, also using Tums.  This is worse if she eats spicy or acidic foods.  Can be worse with foods like tomato and beverages such as coffee and wine.  No dysphagia or odynophagia.  No abdominal pain.  No fevers or chills.  She is excited to report that she has become recently engaged to be married.  There is no wedding date set yet.  She had a  colonoscopy on 10/10/2012 which was normal.  Random biopsies were also normal.   Review of Systems As per HPI, otherwise negative  Current Medications, Allergies, Past Medical History, Past Surgical History, Family History and Social History were reviewed in [employer] record.     Objective:   Physical Exam BP 108/76   Pulse 74   Ht 5\' 2"  (1.575 m)   Wt 127 lb (57.6 kg)   BMI 23.23 kg/m  Constitutional: Well-developed and well-nourished. No distress. HEENT: Normocephalic and atraumatic. Oropharynx is clear and moist. Conjunctivae are normal.  No scleral icterus. Neck: Neck supple. Trachea midline. Cardiovascular: Normal rate, regular rhythm and intact distal pulses. No M/R/G Pulmonary/chest: Effort normal and breath sounds normal. No wheezing, rales or rhonchi. Abdominal: Soft, nontender, nondistended. Bowel sounds active throughout. There are no masses palpable. No hepatosplenomegaly. Anoscopy was performed with the patient in the left lateral decubitus position while a chaperone was present and revealed no external lesions or tag, normal tone, no masses, no stool in the vault, small tender posterior anal fissure in the mid anal canal.  No hemorrhoids were seen. Extremities: no clubbing, cyanosis, or edema Neurological: Alert and oriented to person place and time. Skin: Skin is warm and dry. Psychiatric: Normal mood and affect. Behavior is normal.      Assessment &  Plan:  41 year old female with history of IBS, GERD, anal fissure who is here for follow-up.   1.  Intermittent rectal bleeding/anal fissure --initially I was most suspicious for internal hemorrhoids however these were not seen at anoscopy.  Rather she has a small posterior anal fissure which is likely the source of her recent bleeding.  Bleeding has resolved.  She has diltiazem gel at home which she can use twice daily for a couple of weeks.  If this is a recurrent problem she is asked to notify  me.  She is happy with this plan.  Previous colonoscopy was normal which is reassuring.  2.  GERD --intermittent symptoms.  We again discussed how omeprazole is not the best drug when used intermittently.  I recommended that she retry Zantac 150 milligrams twice daily as needed.  This has been helpful for her in the past.  No alarm symptoms to warrant upper endoscopy  Office follow-up in 1-2 years, sooner as needed 25 minutes spent with the patient today. Greater than 50% was spent in counseling and coordination of care with the patient

## 2017-08-06 NOTE — Patient Instructions (Signed)
Please discontinue prilosec (omeprazole).  Please purchase the following medications over the counter and take as directed: Ranitidine 150 mg twice daily as needed  Please follow up with Dr Hilarie Fredrickson in 1-2 years.  If you are age 42 or older, your body mass index should be between 23-30. Your Body mass index is 23.23 kg/m. If this is out of the aforementioned range listed, please consider follow up with your Primary Care Provider.  If you are age 34 or younger, your body mass index should be between 19-25. Your Body mass index is 23.23 kg/m. If this is out of the aformentioned range listed, please consider follow up with your Primary Care Provider.

## 2017-08-07 ENCOUNTER — Telehealth: Payer: Self-pay | Admitting: Internal Medicine

## 2017-08-07 MED ORDER — DILTIAZEM GEL 2 %: 1.0000 "application " | Freq: Two times a day (BID) | CUTANEOUS | 1 refills | Status: DC

## 2017-08-07 NOTE — Telephone Encounter (Signed)
Rx sent to [facility] as they compound this medication. Left voicemail for patient advising her of this.

## 2017-08-07 NOTE — Telephone Encounter (Signed)
Was given previously by GYN  Diltiazem gel 2% BID x 2-4 weeks PRN fissure Refill 1

## 2017-08-07 NOTE — Telephone Encounter (Signed)
Dr Hilarie Fredrickson, I may be overlooking this in her chart, but I cannot find previous diltiazem rx. Patient is wanting another script. I know you want BID rx but for prescribing purposes, can you please indicate sig? Thanks.

## 2017-11-30 ENCOUNTER — Telehealth: Payer: Self-pay | Admitting: Internal Medicine

## 2017-11-30 NOTE — Telephone Encounter (Signed)
Pt states she is having a lot of abdominal pain and bloating along with back pain. States she is afraid that something bad is wrong. Pts appt moved to 12/05/17@3pm . Pt aware of appt.

## 2017-12-05 ENCOUNTER — Encounter: Payer: Self-pay | Admitting: Nurse Practitioner

## 2017-12-05 ENCOUNTER — Other Ambulatory Visit: Payer: Self-pay

## 2017-12-05 ENCOUNTER — Encounter: Payer: Self-pay | Admitting: Obstetrics and Gynecology

## 2017-12-05 ENCOUNTER — Ambulatory Visit: Payer: Managed Care, Other (non HMO) | Admitting: Nurse Practitioner

## 2017-12-05 ENCOUNTER — Ambulatory Visit (INDEPENDENT_AMBULATORY_CARE_PROVIDER_SITE_OTHER): Payer: Managed Care, Other (non HMO) | Admitting: Obstetrics and Gynecology

## 2017-12-05 VITALS — BP 98/62 | HR 97 | Ht 61.75 in | Wt 132.0 lb

## 2017-12-05 VITALS — BP 124/88 | HR 82 | Ht 62.0 in | Wt 132.0 lb

## 2017-12-05 DIAGNOSIS — R109 Unspecified abdominal pain: Secondary | ICD-10-CM

## 2017-12-05 DIAGNOSIS — Z3009 Encounter for other general counseling and advice on contraception: Secondary | ICD-10-CM | POA: Diagnosis not present

## 2017-12-05 DIAGNOSIS — R110 Nausea: Secondary | ICD-10-CM | POA: Diagnosis not present

## 2017-12-05 DIAGNOSIS — Z1239 Encounter for other screening for malignant neoplasm of breast: Secondary | ICD-10-CM

## 2017-12-05 DIAGNOSIS — Z1231 Encounter for screening mammogram for malignant neoplasm of breast: Secondary | ICD-10-CM

## 2017-12-05 DIAGNOSIS — M546 Pain in thoracic spine: Secondary | ICD-10-CM | POA: Diagnosis not present

## 2017-12-05 DIAGNOSIS — Z01419 Encounter for gynecological examination (general) (routine) without abnormal findings: Secondary | ICD-10-CM

## 2017-12-05 MED ORDER — LIDOCAINE 5 % EX PTCH: MEDICATED_PATCH | CUTANEOUS | 0 refills | Status: DC

## 2017-12-05 NOTE — Patient Instructions (Signed)
If you are age 43 or older, your body mass index should be between 23-30. Your Body mass index is 24.34 kg/m. If this is out of the aforementioned range listed, please consider follow up with your Primary Care Provider.  If you are age 11 or younger, your body mass index should be between 19-25. Your Body mass index is 24.34 kg/m. If this is out of the aformentioned range listed, please consider follow up with your Primary Care Provider.   We have sent the following medications to your pharmacy for you to pick up at your convenience: Lidoderm Patch on for 12 hours, then off for 12 hours.   You have been scheduled for an endoscopy. Please follow written instructions given to you at your visit today. If you use inhalers (even only as needed), please bring them with you on the day of your procedure. Your physician has requested that you go to www.startemmi.com and enter the access code given to you at your visit today. This web site gives a general overview about your procedure. However, you should still follow specific instructions given to you by our office regarding your preparation for the procedure.

## 2017-12-05 NOTE — Patient Instructions (Signed)
I value your feedback and entrusting us with your care. If you get a [facility] patient survey, I would appreciate you taking the time to let us know about your experience today. Thank you! 

## 2017-12-05 NOTE — Progress Notes (Signed)
PCP:  Kathyrn Lass, MD   Chief Complaint  Patient presents with  . Gynecologic Exam     HPI:      [name] is a 42 y.o. No obstetric history on file. who LMP was Patient's last menstrual period was 11/19/2017 (approximate)., presents today for her annual examination.  Her menses are regular every 28-30 days, lasting 6 days.  Dysmenorrhea mild, occurring first 1-2 days of flow. She does not have intermenstrual bleeding.  Sex activity: single partner, contraception - none. Trying to be careful. Can't have estrogen due to breast cancer, ER/PR +, Her2 neu -. Pt had failed IUD insertion in past due to cx "wouldn't open up". S/p cryotherapy. Just doesn't know what she wants to do for [facility]. Not ready for permanent sterilization.  Last Pap: 07/07/16 Results were: no abnormalities /neg HPV DNA  Hx of STDs: HPV  Last mammogram: July 02, 2017  Results were: normal--routine follow-up in 12 months at [facility]. S/P RT breast  Intraductal carcinoma with lumpectomy, radiation, and tamoxifen tx but couldn't tolerate it. MD wants her back on tamoxifen but pt can't decide if she wants to try it again.   There is no FH of breast cancer. There is no FH of ovarian cancer. The patient does not do self-breast exams. Pt had neg MyRisk testing 2017.   Tobacco use: The patient denies current or previous tobacco use. Alcohol use: social drinker No drug use.  Exercise: moderately active  She does get adequate calcium but stopped Vitamin D supp in her diet.  Having GI issues and severe back pain, particularly with lying down. Seeing GI today.   Past Medical History:  Diagnosis Date  . Anxiety   . BRCA negative 2017   MyRisk neg  . Breast cancer ([place]) 12/2015   ER/PR +, Her2 neu -; neg lymph nodes; s/p lumpectomy, radiation, attempted tamoxifen with side effects  . Cervical dysplasia   . Gastrointestinal disorder   . GERD (gastroesophageal reflux disease)   . IBS (irritable bowel syndrome)    . Ovarian cyst     Past Surgical History:  Procedure Laterality Date  . arm surgery     left  . BREAST LUMPECTOMY WITH RADIOACTIVE SEED AND SENTINEL LYMPH NODE BIOPSY Right 03/01/2016   Procedure: RIGHT BREAST LUMPECTOMY WITH RADIOACTIVE SEED AND SENTINEL LYMPH NODE BIOPSY;  Surgeon: Stark Klein, MD;  Location: [facility];  Service: General;  Laterality: Right;  . CRYOTHERAPY    . EXCISION MASS LOWER EXTREMETIES Right 03/01/2016   Procedure: EXCISION MASS RIGHT THIGH;  Surgeon: Stark Klein, MD;  Location: [facility];  Service: General;  Laterality: Right;  . MOUTH SURGERY     "gum surgery" x 2  . TONSILLECTOMY    . WISDOM TOOTH EXTRACTION      Family History  Problem Relation Age of Onset  . Hyperlipidemia Father   . Prostate cancer Father   . Hypertension Father   . Hypertension Mother   . Hyperlipidemia Mother   . Bone cancer Paternal Grandfather   . Colon cancer Other        PGGF    Social History   Socioeconomic History  . Marital status: Single    Spouse name: Not on file  . Number of children: 0  . Years of education: Not on file  . Highest education level: Not on file  Social Needs  . Financial resource strain: Not on file  . Food insecurity -  worry: Not on file  . Food insecurity - inability: Not on file  . Transportation needs - medical: Not on file  . Transportation needs - non-medical: Not on file  Occupational History  . Occupation: Secondary school teacher  Tobacco Use  . Smoking status: Never Smoker  . Smokeless tobacco: Never Used  Substance and Sexual Activity  . Alcohol use: Yes    Comment: 0-1 drinks daily  . Drug use: No  . Sexual activity: Not on file  Other Topics Concern  . Not on file  Social History Narrative  . Not on file    Outpatient Medications Prior to Visit  Medication Sig Dispense Refill  . ALPRAZolam (XANAX) 0.5 MG tablet Take 1 tablet (0.5 mg total) by mouth 2 (two) times daily as needed for  anxiety. 50 tablet 0  . chlordiazePOXIDE (LIBRIUM) 10 MG capsule Take 5 mg by mouth daily.     Marland Kitchen loratadine (ALAVERT) 10 MG tablet Take 10 mg by mouth daily as needed.    . ranitidine (ZANTAC) 150 MG tablet Take 1 tablet (150 mg total) 2 (two) times daily as needed by mouth for heartburn. 1 tablet 0  . triamcinolone (NASACORT) 55 MCG/ACT AERO nasal inhaler Place 2 sprays into the nose as needed.     . venlafaxine XR (EFFEXOR-XR) 37.5 MG 24 hr capsule Take 1 capsule (37.5 mg total) daily with breakfast by mouth. 30 capsule 6  . diltiazem 2 % GEL Apply 1 application topically 2 (two) times daily. (Patient not taking: Reported on 12/05/2017) 30 g 1   No facility-administered medications prior to visit.     ROS:  Review of Systems  Constitutional: Negative for fatigue, fever and unexpected weight change.  Respiratory: Negative for cough, shortness of breath and wheezing.   Cardiovascular: Negative for chest pain, palpitations and leg swelling.  Gastrointestinal: Positive for nausea. Negative for blood in stool, constipation, diarrhea and vomiting.  Endocrine: Negative for cold intolerance, heat intolerance and polyuria.  Genitourinary: Negative for dyspareunia, dysuria, flank pain, frequency, genital sores, hematuria, menstrual problem, pelvic pain, urgency, vaginal bleeding, vaginal discharge and vaginal pain.  Musculoskeletal: Positive for back pain. Negative for joint swelling and myalgias.  Skin: Negative for rash.  Neurological: Negative for dizziness, syncope, light-headedness, numbness and headaches.  Hematological: Negative for adenopathy.  Psychiatric/Behavioral: Negative for agitation, confusion, sleep disturbance and suicidal ideas. The patient is not nervous/anxious.    BREAST: No symptoms   Objective: BP 124/88 (BP Location: Left Arm, Patient Position: Sitting, Cuff Size: Normal)   Pulse 82   Ht '5\' 2"'  (1.575 m)   Wt 132 lb (59.9 kg)   LMP 11/19/2017 (Approximate)   BMI  24.14 kg/m    Physical Exam  Constitutional: She is oriented to person, place, and time. She appears well-developed and well-nourished.  Genitourinary: Vagina normal and uterus normal. There is no rash or tenderness on the right labia. There is no rash or tenderness on the left labia. No erythema or tenderness in the vagina. No vaginal discharge found. Right adnexum does not display mass and does not display tenderness. Left adnexum does not display mass and does not display tenderness. Cervix does not exhibit motion tenderness or polyp. Uterus is not enlarged or tender.  Neck: Normal range of motion. No thyromegaly present.  Cardiovascular: Normal rate, regular rhythm and normal heart sounds.  No murmur heard. Pulmonary/Chest: Effort normal and breath sounds normal. Right breast exhibits no mass, no nipple discharge, no skin change and no tenderness. Left  breast exhibits no mass, no nipple discharge, no skin change and no tenderness.  Abdominal: Soft. There is no tenderness. There is no guarding.  Musculoskeletal: Normal range of motion.  Neurological: She is alert and oriented to person, place, and time. No cranial nerve deficit.  Psychiatric: She has a normal mood and affect. Her behavior is normal.  Vitals reviewed.   Assessment/Plan: Encounter for annual routine gynecological examination  Screening for breast cancer - Pt followed by oncology. Has mammo coming up.   Encounter for other general counseling or advice on contraception - Discussed Kyleena vs Paragard. Pt to check with oncology first about their recommendation. Breast cancer was PR + but previous onc suggested mirena.        GYN counsel breast self exam, mammography screening, family planning choices, adequate intake of calcium and vitamin D, diet and exercise     F/U  Return in about 1 year (around 12/06/2018).  [place] Merida B. Theordore Cisnero, PA-C 12/05/2017 2:40 PM

## 2017-12-05 NOTE — Progress Notes (Addendum)
IMPRESSION and PLAN:    #1. 42 yo female with left mid back and left flank pain. She also has new bloating / nausea which I can't explain nor tie into what I feel is most consistent with musculoskeletal pain.  -PCP did labs, urine and back xray. I have requested results, patient says everything was normal.  -If left lateral rib cage not checked then I will obtain xray -trial of lidoderm patch to left flank / left mid back in the interim. If insurance won't pay then will try topical diclofenac. She declines trial of NSAIDs as it makes heartburn worse.       #2. Nausea / bloating x 1 month. Negative home pregnancy test. Absence of weight loss is reassuring.  -Probably low yield but will proceed with an EGD for further evaluation. Patient has personal hx of breast cancer in 2017 and her symptoms are significant source of worry for her. The risks and benefits of EGD were discussed and the patient agrees to proceed.   Addendum: Reviewed and agree with initial management. Pyrtle, Allison Lines, MD         HPI:    Chief Complaint: bloating , left mid back pain, left flank pain and nausea.    Patient is a 42 yo female with a hx of right invasive ductal carcinoma breast cancer, s/p lumpectomy with lymph node dissection. Following that she underwent radiation and took tamoxifen for a short while. She has a hx of anxiety and had a hard time with tamoxifen, caused emotional lability. She had problems with right breast pain / swelling / cellulitis several months after radiation. Ultimately evaluated at [facility] and told she had something called recall radiation.   Allison Mcclure is followed by Dr. Hilarie Fredrickson for hx of D-IBS and GERD. From an IBS standpoint she is actually doing well Stools formed lately. Allison Mcclure is here for some new problems which started about one month ago. Having constant achy / dull mid back pain (localized area just left of spine).  She also has pain in left flank area. None of her sx are not  related to eating. Lying on left side makes the left flank pain worse. She has not injured her back or left side. Hasn't been exercising lately or lifting anything heavy.  Urinating fine. No hematuria. No skin lesions in area.   In addition to these symptoms Allison Mcclure feels full and bloated and having periods of nausea. She has gained a few pounds.  Evaluated by PCP, apparently blood, urine and back xrays done and everything normal. Records requested. She has SI joint problems, saw her chiropractor and got back adjustment but it didn't help her back pain.  worsening heartburn at night and in the.  No blood in stool. S Hasn't lifted anything or traumatized back or flank area. Patient is anxious about these symptoms because of her hx of breast cancer in 2017.   Allison Mcclure is having increased GERD sx with heartburn and regurgitation. Doesn't like to take pills but will take Tums sometimes. Goes to bed on empty stomach, sleeps on pillows.  She has also taken Zantac a few times   As far as the nausea,  it comes in waves several times a week.  Crackers help. She hasn't started any new medications recently. Negative pregnancy test at home last week. LMP was 2.5 weeks ago. Not takng NSAIDS.    Review of systems:  No dizziness. No chest pain. No SOB.  Past Medical History:  Diagnosis Date  . Anxiety   . BRCA negative 2017   MyRisk neg  . Breast cancer ([place]) 12/2015   ER/PR +, Her2 neu -; neg lymph nodes; s/p lumpectomy, radiation, attempted tamoxifen with side effects  . Cervical dysplasia   . Gastrointestinal disorder   . GERD (gastroesophageal reflux disease)   . IBS (irritable bowel syndrome)   . Ovarian cyst     Patient's surgical history, family medical history, social history, medications and allergies were all reviewed in Epic    Physical Exam:     BP 98/62   Pulse 97   Ht 5' 1.75" (1.568 m)   Wt 132 lb (59.9 kg)   LMP 11/19/2017 (Approximate)   BMI 24.34 kg/m   GENERAL:  Well  developed white female in NAD PSYCH: :Pleasant, cooperative, normal affect EENT:  conjunctiva pink, mucous membranes moist, neck supple without masses CARDIAC:  RRR, no peripheral edema PULM: Normal respiratory effort, lungs CTA bilaterally, no wheezing ABDOMEN:  Nondistended, soft, nontender. No obvious masses, no hepatomegaly,  normal bowel sounds Musculoskeletal: localized area of tenderness over bottom rib (left lateral ). No tenderness appreciated over left mid back.  SKIN:  turgor, no lesions seen Musculoskeletal:  Normal muscle tone, normal strength NEURO: Alert and oriented x 3, no focal neurologic deficits   Allison Mcclure , NP 12/05/2017, 3:30 PM

## 2017-12-06 ENCOUNTER — Encounter: Payer: Self-pay | Admitting: Internal Medicine

## 2017-12-07 ENCOUNTER — Ambulatory Visit: Payer: Managed Care, Other (non HMO) | Admitting: Nurse Practitioner

## 2017-12-14 ENCOUNTER — Ambulatory Visit (AMBULATORY_SURGERY_CENTER): Payer: Managed Care, Other (non HMO) | Admitting: Internal Medicine

## 2017-12-14 ENCOUNTER — Other Ambulatory Visit: Payer: Self-pay

## 2017-12-14 ENCOUNTER — Encounter: Payer: Self-pay | Admitting: Internal Medicine

## 2017-12-14 VITALS — BP 130/44 | HR 77 | Temp 98.4°F | Resp 19 | Ht 61.75 in | Wt 132.0 lb

## 2017-12-14 DIAGNOSIS — M549 Dorsalgia, unspecified: Secondary | ICD-10-CM

## 2017-12-14 DIAGNOSIS — R110 Nausea: Secondary | ICD-10-CM

## 2017-12-14 DIAGNOSIS — R140 Abdominal distension (gaseous): Secondary | ICD-10-CM | POA: Diagnosis not present

## 2017-12-14 MED ORDER — SODIUM CHLORIDE 0.9 % IV SOLN: 500.0000 mL | Freq: Once | INTRAVENOUS | Status: DC

## 2017-12-14 NOTE — Patient Instructions (Signed)
Impression/Recommendations:  Resume previous diet. Continue present medications.  YOU HAD AN ENDOSCOPIC PROCEDURE TODAY AT THE [facility]:   Refer to the procedure report that was given to you for any specific questions about what was found during the examination.  If the procedure report does not answer your questions, please call your gastroenterologist to clarify.  If you requested that your care partner not be given the details of your procedure findings, then the procedure report has been included in a sealed envelope for you to review at your convenience later.  YOU SHOULD EXPECT: Some feelings of bloating in the abdomen. Passage of more gas than usual.  Walking can help get rid of the air that was put into your GI tract during the procedure and reduce the bloating. If you had a lower endoscopy (such as a colonoscopy or flexible sigmoidoscopy) you may notice spotting of blood in your stool or on the toilet paper. If you underwent a bowel prep for your procedure, you may not have a normal bowel movement for a few days.  Please Note:  You might notice some irritation and congestion in your nose or some drainage.  This is from the oxygen used during your procedure.  There is no need for concern and it should clear up in a day or so.  SYMPTOMS TO REPORT IMMEDIATELY:  Following upper endoscopy (EGD)  Vomiting of blood or coffee ground material  New chest pain or pain under the shoulder blades  Painful or persistently difficult swallowing  New shortness of breath  Fever of 100F or higher  Black, tarry-looking stools  For urgent or emergent issues, a gastroenterologist can be reached at any hour by calling [phone].   DIET:  We do recommend a small meal at first, but then you may proceed to your regular diet.  Drink plenty of fluids but you should avoid alcoholic beverages for 24 hours.  ACTIVITY:  You should plan to take it easy for the rest of today and you should NOT  DRIVE or use heavy machinery until tomorrow (because of the sedation medicines used during the test).    FOLLOW UP: Our staff will call the number listed on your records the next business day following your procedure to check on you and address any questions or concerns that you may have regarding the information given to you following your procedure. If we do not reach you, we will leave a message.  However, if you are feeling well and you are not experiencing any problems, there is no need to return our call.  We will assume that you have returned to your regular daily activities without incident.  If any biopsies were taken you will be contacted by phone or by letter within the next 1-3 weeks.  Please call us at [phone] if you have not heard about the biopsies in 3 weeks.    SIGNATURES/CONFIDENTIALITY: You and/or your care partner have signed paperwork which will be entered into your electronic medical record.  These signatures attest to the fact that that the information above on your After Visit Summary has been reviewed and is understood.  Full responsibility of the confidentiality of this discharge information lies with you and/or your care-partner. 

## 2017-12-14 NOTE — Op Note (Signed)
[place] Patient Name: Allison Mcclure Procedure Date: 12/14/2017 2:48 PM MRN: [id number] Endoscopist: Jerene Bears , MD Age: 42 Referring MD:  Date of Birth: 1975/12/27 Gender: Female Account #: [id number] Procedure:                Upper GI endoscopy Indications:              Abdominal bloating, Nausea, upper back and left                            upper back pain Medicines:                Monitored Anesthesia Care Procedure:                Pre-Anesthesia Assessment:                           - Prior to the procedure, a History and Physical                            was performed, and patient medications and                            allergies were reviewed. The patient's tolerance of                            previous anesthesia was also reviewed. The risks                            and benefits of the procedure and the sedation                            options and risks were discussed with the patient.                            All questions were answered, and informed consent                            was obtained. Prior Anticoagulants: The patient has                            taken no previous anticoagulant or antiplatelet                            agents. ASA Grade Assessment: II - A patient with                            mild systemic disease. After reviewing the risks                            and benefits, the patient was deemed in                            satisfactory condition to undergo the procedure.  After obtaining informed consent, the endoscope was                            passed under direct vision. Throughout the                            procedure, the patient's blood pressure, pulse, and                            oxygen saturations were monitored continuously. The                            Endoscope was introduced through the mouth, and                            advanced to the second part of duodenum.  The upper                            GI endoscopy was accomplished without difficulty.                            The patient tolerated the procedure well. Scope In: Scope Out: Findings:                 The examined esophagus was normal.                           The entire examined stomach was normal. Biopsies                            were taken with a cold forceps for histology and                            Helicobacter pylori testing.                           The examined duodenum was normal. Biopsies for                            histology were taken with a cold forceps for                            evaluation of celiac disease. Complications:            No immediate complications. Estimated Blood Loss:     Estimated blood loss was minimal. Impression:               - Normal esophagus.                           - Normal stomach. Biopsied.                           - Normal examined duodenum. Biopsied. Recommendation:           - Patient has a contact number available for  emergencies. The signs and symptoms of potential                            delayed complications were discussed with the                            patient. Return to normal activities tomorrow.                            Written discharge instructions were provided to the                            patient.                           - Resume previous diet.                           - Continue present medications.                           - Await pathology results. Jerene Bears, MD 12/14/2017 3:13:46 PM This report has been signed electronically.

## 2017-12-14 NOTE — Progress Notes (Signed)
A and O x3. Report to RN. Tolerated MAC anesthesia well.Teeth unchanged after procedure.

## 2017-12-14 NOTE — Progress Notes (Signed)
Pt's states no medical or surgical changes since previsit or office visit. 

## 2017-12-14 NOTE — Progress Notes (Signed)
Called to room to assist during endoscopic procedure.  Patient ID and intended procedure confirmed with present staff. Received instructions for my participation in the procedure from the performing physician.  

## 2017-12-17 ENCOUNTER — Telehealth: Payer: Self-pay | Admitting: *Deleted

## 2017-12-17 NOTE — Telephone Encounter (Signed)
No answer, message left for the patient. 

## 2017-12-17 NOTE — Telephone Encounter (Signed)
  Follow up Call-  Call back number 12/14/2017  Post procedure Call Back phone  # [phone]  Permission to leave phone message Yes  Some recent data might be hidden     Patient questions:  Message left to call us if necessary. Second call.

## 2017-12-18 ENCOUNTER — Encounter: Payer: Self-pay | Admitting: Internal Medicine

## 2018-01-01 ENCOUNTER — Ambulatory Visit
Admission: RE | Admit: 2018-01-01 | Discharge: 2018-01-01 | Disposition: A | Discharge status: Home / Self Care | Payer: Managed Care, Other (non HMO) | Source: Ambulatory Visit | Attending: Chiropractic Medicine | Admitting: Chiropractic Medicine

## 2018-01-01 ENCOUNTER — Other Ambulatory Visit: Payer: Self-pay | Admitting: Chiropractic Medicine

## 2018-01-01 DIAGNOSIS — M545 Low back pain, unspecified: Secondary | ICD-10-CM

## 2018-01-14 ENCOUNTER — Other Ambulatory Visit: Payer: Self-pay | Admitting: General Surgery

## 2018-01-14 DIAGNOSIS — M7989 Other specified soft tissue disorders: Secondary | ICD-10-CM

## 2018-01-14 DIAGNOSIS — R2242 Localized swelling, mass and lump, left lower limb: Principal | ICD-10-CM

## 2018-01-17 ENCOUNTER — Ambulatory Visit
Admission: RE | Admit: 2018-01-17 | Discharge: 2018-01-17 | Disposition: A | Discharge status: Home / Self Care | Payer: Managed Care, Other (non HMO) | Source: Ambulatory Visit | Attending: General Surgery | Admitting: General Surgery

## 2018-01-17 DIAGNOSIS — M7989 Other specified soft tissue disorders: Secondary | ICD-10-CM

## 2018-01-17 DIAGNOSIS — R2242 Localized swelling, mass and lump, left lower limb: Principal | ICD-10-CM

## 2018-01-21 ENCOUNTER — Other Ambulatory Visit: Payer: Self-pay | Admitting: General Surgery

## 2018-01-21 DIAGNOSIS — M79652 Pain in left thigh: Secondary | ICD-10-CM

## 2018-01-23 ENCOUNTER — Inpatient Hospital Stay
Admission: RE | Admit: 2018-01-23 | Discharge: 2018-01-23 | Disposition: A | Discharge status: Home / Self Care | Payer: Managed Care, Other (non HMO) | Source: Ambulatory Visit | Attending: General Surgery | Admitting: General Surgery

## 2018-01-25 ENCOUNTER — Other Ambulatory Visit: Payer: Managed Care, Other (non HMO)

## 2018-02-01 ENCOUNTER — Encounter: Payer: Self-pay | Admitting: Family Medicine

## 2018-02-01 ENCOUNTER — Ambulatory Visit (INDEPENDENT_AMBULATORY_CARE_PROVIDER_SITE_OTHER): Payer: Managed Care, Other (non HMO) | Admitting: Family Medicine

## 2018-02-01 DIAGNOSIS — G8929 Other chronic pain: Secondary | ICD-10-CM

## 2018-02-01 DIAGNOSIS — M546 Pain in thoracic spine: Secondary | ICD-10-CM | POA: Diagnosis not present

## 2018-02-01 NOTE — Patient Instructions (Signed)
Given the symptoms you're having, the severity, how long, your history of breast cancer, night pain, I think we need to do an MRI of your thoracic spine to further evaluate. You can come in the day afterwards to go over the results in person (no charge visit) or I can call you with the results.

## 2018-02-03 ENCOUNTER — Encounter: Payer: Self-pay | Admitting: Family Medicine

## 2018-02-03 DIAGNOSIS — M546 Pain in thoracic spine: Secondary | ICD-10-CM | POA: Insufficient documentation

## 2018-02-03 NOTE — Progress Notes (Addendum)
PCP: Kathyrn Lass, MD  Subjective:   HPI: Patient is a 42 y.o. female here for thoracic pain.  Patient reports she does not recall an obvious injury or trauma. Pain in mid-thoracic region started in mid-February. Pain is 3/10 level but gets up to 10/10 and sharp at times. Worse at night and can wake her up. No radiation into extremities. Worse lying on stomach. She did have pain back then when pushing up against a chair. No popping or snapping. No history of compression fracture. Tried lidocaine patches, advil, chiropractic care without benefit. Had radiographs that were negative. History of breast cancer (stage 1 with lumpectomy and radiation). No fever, sweats, weight loss.  Past Medical History:  Diagnosis Date  . Anxiety   . BRCA negative 2017   MyRisk neg  . Breast cancer ([place]) 12/2015   ER/PR +, Her2 neu -; neg lymph nodes; s/p lumpectomy, radiation, attempted tamoxifen with side effects  . Cervical dysplasia   . Gastrointestinal disorder   . GERD (gastroesophageal reflux disease)   . IBS (irritable bowel syndrome)   . Ovarian cyst     Current Outpatient Medications on File Prior to Visit  Medication Sig Dispense Refill  . ALPRAZolam (XANAX) 0.5 MG tablet Take 1 tablet (0.5 mg total) by mouth 2 (two) times daily as needed for anxiety. 50 tablet 0  . chlordiazePOXIDE (LIBRIUM) 10 MG capsule Take 5 mg by mouth daily as needed for anxiety.     . DULoxetine (CYMBALTA) 20 MG capsule TAKE ONE CAPSULE IN THE MORNING  12  . lidocaine (LIDODERM) 5 % On for 12 hours, then off for 12 hours. 10 patch 0  . loratadine (ALAVERT) 10 MG tablet Take 10 mg by mouth daily as needed.    . ranitidine (ZANTAC) 150 MG tablet Take 1 tablet (150 mg total) 2 (two) times daily as needed by mouth for heartburn. 1 tablet 0  . triamcinolone (NASACORT) 55 MCG/ACT AERO nasal inhaler Place 2 sprays into the nose as needed.     . venlafaxine XR (EFFEXOR-XR) 37.5 MG 24 hr capsule Take 1 capsule (37.5  mg total) daily with breakfast by mouth. 30 capsule 6   Current Facility-Administered Medications on File Prior to Visit  Medication Dose Route Frequency Provider Last Rate Last Dose  . 0.9 %  sodium chloride infusion  500 mL Intravenous Once Pyrtle, Lajuan Lines, MD        Past Surgical History:  Procedure Laterality Date  . arm surgery     left  . BREAST LUMPECTOMY WITH RADIOACTIVE SEED AND SENTINEL LYMPH NODE BIOPSY Right 03/01/2016   Procedure: RIGHT BREAST LUMPECTOMY WITH RADIOACTIVE SEED AND SENTINEL LYMPH NODE BIOPSY;  Surgeon: Stark Klein, MD;  Location: [facility];  Service: General;  Laterality: Right;  . CRYOTHERAPY    . EXCISION MASS LOWER EXTREMETIES Right 03/01/2016   Procedure: EXCISION MASS RIGHT THIGH;  Surgeon: Stark Klein, MD;  Location: [facility];  Service: General;  Laterality: Right;  . MOUTH SURGERY     "gum surgery" x 2  . TONSILLECTOMY    . WISDOM TOOTH EXTRACTION      Allergies  Allergen Reactions  . Sunscreen Spf30 [Solbar Pf Spf15] Hives, Itching and Rash  . Keflex [Cephalexin] Rash    Social History   Socioeconomic History  . Marital status: Single    Spouse name: Not on file  . Number of children: 0  . Years of education: Not on file  . Highest  education level: Not on file  Occupational History  . Occupation: Secondary school teacher  Social Needs  . Financial resource strain: Not on file  . Food insecurity:    Worry: Not on file    Inability: Not on file  . Transportation needs:    Medical: Not on file    Non-medical: Not on file  Tobacco Use  . Smoking status: Never Smoker  . Smokeless tobacco: Current User    Types: Snuff  Substance and Sexual Activity  . Alcohol use: Yes    Comment: 0-1 drinks daily  . Drug use: No  . Sexual activity: Not on file  Lifestyle  . Physical activity:    Days per week: 0 days    Minutes per session: Not on file  . Stress: To some extent  Relationships  . Social connections:     Talks on phone: Not on file    Gets together: Not on file    Attends religious service: Not on file    Active member of club or organization: Not on file    Attends meetings of clubs or organizations: Not on file    Relationship status: Living with partner  . Intimate partner violence:    Fear of current or ex partner: No    Emotionally abused: No    Physically abused: No    Forced sexual activity: No  Other Topics Concern  . Not on file  Social History Narrative  . Not on file    Family History  Problem Relation Age of Onset  . Hyperlipidemia Father   . Prostate cancer Father   . Hypertension Father   . Hypertension Mother   . Hyperlipidemia Mother   . Bone cancer Paternal Grandfather   . Colon cancer Other        PGGF    BP 110/79   Pulse 81   Ht '5\' 2"'  (1.575 m)   Wt 130 lb (59 kg)   BMI 23.78 kg/m   Review of Systems: See HPI above.     Objective:  Physical Exam:  Gen: NAD, comfortable in exam room  Back/neck: No gross deformity, swelling, bruising. No paraspinal TTP.  No midline/bony TTP. FROM without pain. BUE strength 5/5.   Sensation intact to light touch.   NV intact distal BUEs.   Assessment & Plan:  1. Thoracic back pain - independently reviewed radiographs and no evidence compression fracture, lytic lesion.  Concerning that she's had 3 months of thoracic back pain that is severe, worse at nighttime without injury in a patient with history of breast cancer, not responding to conservative measures in that time including advil, lidocaine patches, home exercises and stretches, chiropractic care.  Will go ahead with MRI of thoracic spine to further assess for malignancy.  Addendum:  MRI reviewed and discussed with patient.  No abnormalities noted, no new lesions.  Reassured patient.  Her pain is becoming more consistent though and more throughout the day, going down back to lumbar area as well from thoracic.  We discussed physical therapy but question if  she is having referred pain from something else because muscular pain does not typically cause the severity, course she is describing with a normal exam.  She denied respiratory, cardiac, GU, GI symptoms, fevers, chills, sweats.  I advised she make follow-ups with her PCP as well as her oncologist to discuss her symptoms and possible additional workup.  ? If CT abd/pelvis/chest, PET scans?  Having arthralgias related to prior radiation (though  would be unusual I would think given how long ago this was).

## 2018-02-03 NOTE — Assessment & Plan Note (Signed)
independently reviewed radiographs and no evidence compression fracture, lytic lesion.  Concerning that she's had 3 months of thoracic back pain that is severe, worse at nighttime without injury in a patient with history of breast cancer, not responding to conservative measures in that time including advil, lidocaine patches, home exercises and stretches, chiropractic care.  Will go ahead with MRI of thoracic spine to further assess for malignancy.

## 2018-02-14 NOTE — Addendum Note (Signed)
Addended by: Sherrie George F on: 02/14/2018 09:13 AM   Modules accepted: Orders

## 2018-02-15 ENCOUNTER — Other Ambulatory Visit: Payer: Self-pay | Admitting: Hematology and Oncology

## 2018-02-22 ENCOUNTER — Encounter: Payer: Self-pay | Admitting: Family Medicine

## 2018-02-26 ENCOUNTER — Encounter: Payer: Self-pay | Admitting: Family Medicine

## 2018-02-26 ENCOUNTER — Ambulatory Visit (INDEPENDENT_AMBULATORY_CARE_PROVIDER_SITE_OTHER): Payer: Managed Care, Other (non HMO) | Admitting: Family Medicine

## 2018-02-26 VITALS — BP 123/85 | HR 76 | Ht 62.0 in

## 2018-02-26 DIAGNOSIS — G8929 Other chronic pain: Secondary | ICD-10-CM

## 2018-02-26 DIAGNOSIS — M546 Pain in thoracic spine: Secondary | ICD-10-CM

## 2018-02-27 ENCOUNTER — Encounter: Payer: Self-pay | Admitting: Family Medicine

## 2018-02-27 NOTE — Progress Notes (Signed)
Patient came in today to review MRI in person and we looked at images together.  Please see my note addendum for details - we went over these recommendations again.

## 2018-06-24 ENCOUNTER — Other Ambulatory Visit (INDEPENDENT_AMBULATORY_CARE_PROVIDER_SITE_OTHER): Payer: Managed Care, Other (non HMO)

## 2018-06-24 ENCOUNTER — Ambulatory Visit: Payer: Managed Care, Other (non HMO) | Admitting: Physician Assistant

## 2018-06-24 ENCOUNTER — Encounter: Payer: Self-pay | Admitting: Physician Assistant

## 2018-06-24 ENCOUNTER — Encounter

## 2018-06-24 VITALS — BP 114/70 | HR 80 | Ht 62.21 in | Wt 141.5 lb

## 2018-06-24 DIAGNOSIS — R6881 Early satiety: Secondary | ICD-10-CM | POA: Diagnosis not present

## 2018-06-24 DIAGNOSIS — R1013 Epigastric pain: Secondary | ICD-10-CM | POA: Diagnosis not present

## 2018-06-24 DIAGNOSIS — R140 Abdominal distension (gaseous): Secondary | ICD-10-CM

## 2018-06-24 LAB — COMPREHENSIVE METABOLIC PANEL
ALT: 11 U/L (ref 0–35)
AST: 13 U/L (ref 0–37)
Albumin: 4.1 g/dL (ref 3.5–5.2)
Alkaline Phosphatase: 55 U/L (ref 39–117)
BUN: 14 mg/dL (ref 6–23)
CHLORIDE: 103 meq/L (ref 96–112)
CO2: 28 mEq/L (ref 19–32)
CREATININE: 0.8 mg/dL (ref 0.40–1.20)
Calcium: 9.3 mg/dL (ref 8.4–10.5)
GFR: 83.12 mL/min (ref >60.00)
GLUCOSE: 112 mg/dL — AB (ref 70–99)
POTASSIUM: 4.3 meq/L (ref 3.5–5.1)
SODIUM: 136 meq/L (ref 135–145)
Total Bilirubin: 0.4 mg/dL (ref 0.2–1.2)
Total Protein: 6.9 g/dL (ref 6.0–8.3)

## 2018-06-24 LAB — CBC WITH DIFFERENTIAL/PLATELET
BASOS PCT: 0.8 % (ref 0.0–3.0)
Basophils Absolute: 0 10*3/uL (ref 0.0–0.1)
EOS ABS: 0.1 10*3/uL (ref 0.0–0.7)
EOS PCT: 2 % (ref 0.0–5.0)
HCT: 43.4 % (ref 36.0–46.0)
Hemoglobin: 14.6 g/dL (ref 12.0–15.0)
LYMPHS ABS: 1.2 10*3/uL (ref 0.7–4.0)
Lymphocytes Relative: 22.1 % (ref 12.0–46.0)
MCHC: 33.6 g/dL (ref 30.0–36.0)
MCV: 94.8 fl (ref 78.0–100.0)
MONO ABS: 0.5 10*3/uL (ref 0.1–1.0)
Monocytes Relative: 10.2 % (ref 3.0–12.0)
NEUTROS ABS: 3.4 10*3/uL (ref 1.4–7.7)
NEUTROS PCT: 64.9 % (ref 43.0–77.0)
PLATELETS: 267 10*3/uL (ref 150.0–400.0)
RBC: 4.58 Mil/uL (ref 3.87–5.11)
RDW: 12.3 % (ref 11.5–15.5)
WBC: 5.3 10*3/uL (ref 4.0–10.5)

## 2018-06-24 NOTE — Patient Instructions (Addendum)
Your provider has requested that you go to the basement level for lab work before leaving today. Press "B" on the elevator. The lab is located at the first door on the left as you exit the elevator. Omeprazole 40 mg , we sent a prescription for refills to your pharmacy. [facility].   You have been scheduled for a CT scan of the abdomen and pelvis at [facility] ([address]---this is in the same building as [facility]).   You are scheduled on Thursday 07-04-2018 at 1:00 PM. You should arrive at 12:45 PM to your appointment time for registration. Please follow the written instructions below on the day of your exam:  WARNING: IF YOU ARE ALLERGIC TO IODINE/X-RAY DYE, PLEASE NOTIFY RADIOLOGY IMMEDIATELY AT [phone]! YOU WILL BE GIVEN A 13 HOUR PREMEDICATION PREP.  1) Do not eat anything after 9:00 am (4 hours prior to your test) 2) You have been given 2 bottles of oral contrast to drink. The solution may taste better if refrigerated, but do NOT add ice or any other liquid to this solution. Shake well before drinking.    Drink 1 bottle of contrast @ 11:00 am(2 hours prior to your exam)  Drink 1 bottle of contrast @ 12:00 noon (1 hour prior to your exam)  You may take any medications as prescribed with a small amount of water, if necessary. If you take any of the following medications: METFORMIN, GLUCOPHAGE, GLUCOVANCE, AVANDAMET, RIOMET, FORTAMET, [place] MET, JANUMET, GLUMETZA or METAGLIP, you MAY be asked to HOLD this medication 48 hours AFTER the exam.  The purpose of you drinking the oral contrast is to aid in the visualization of your intestinal tract. The contrast solution may cause some diarrhea. Depending on your individual set of symptoms, you may also receive an intravenous injection of x-ray contrast/dye. Plan on being at [facility] for 30 minutes or longer, depending on the type of exam you are having performed.  This test typically takes 30-45  minutes to complete.  If you have any questions regarding your exam or if you need to reschedule, you may call the CT department at [phone] between the hours of 8:00 am and 5:00 pm, Monday-Friday.  ________________________________________________________________________Normal BMI (Body Mass Index- based on height and weight) is between 19 and 25. Your BMI today is Body mass index is 25.71 kg/m. Marland Kitchen Please consider follow up  regarding your BMI with your Primary Care Provider.

## 2018-06-24 NOTE — Progress Notes (Addendum)
Subjective:    Patient ID: Allison Mcclure, female    DOB: 1975-12-07, 42 y.o.   MRN: [id number]  HPI Allison Mcclure is a pleasant 42 year old white female, known to Dr. Hilarie Fredrickson who was last seen in the office in March 2019 with complaints of nausea and GERD related symptoms.  She underwent EGD in March 2019 which was a normal exam.  Biopsies were taken from the stomach which were benign with no evidence of H. pylori and small bowel biopsies were also normal with no evidence of celiac disease. 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 She comes in today stating that she is been having trouble over the past 2 months with feeling excessively full and bloated and uncomfortable in her epigastrium she says at times this is actually pain and she feels miserable.  Describes it as a pressure type of pain.  Appetite is been okay there is not been any nausea or vomiting but she says sometimes she feels full even when she has not eaten much.  Weight has been stable and actually she is distressed because she is gained about 25 pounds this year.  She attributes that to medication as she was on Cymbalta and Effexor  for the neuropathy symptoms.  She has taken herself off of both of these medications. She has not been on any regular NSAIDs, may take an Advil one time twice a week. She feels that her symptoms have progressed over the past several weeks since she is feeling miserable most days not every day.  She does not think that p.o. intake necessarily brings on the symptoms. Not been taking any regular H2 blocker or PPI.  Review of Systems Pertinent positive and negative review of systems were noted in the above HPI section.  All other review of systems was otherwise negative.  Outpatient Encounter Medications as of 06/24/2018  Medication Sig  . ALPRAZolam (XANAX) 0.5 MG tablet Take 1 tablet (0.5 mg total) by mouth 2  (two) times daily as needed for anxiety. (Patient taking differently: Take 0.5 mg by mouth as needed for anxiety. )  . loratadine (ALAVERT) 10 MG tablet Take 10 mg by mouth daily as needed.  . ranitidine (ZANTAC) 150 MG tablet Take 1 tablet (150 mg total) 2 (two) times daily as needed by mouth for heartburn. (Patient taking differently: Take 150 mg by mouth as needed for heartburn. )  . triamcinolone (NASACORT) 55 MCG/ACT AERO nasal inhaler Place 2 sprays into the nose as needed.   . [DISCONTINUED] chlordiazePOXIDE (LIBRIUM) 10 MG capsule Take 5 mg by mouth daily as needed for anxiety.   . [DISCONTINUED] DULoxetine (CYMBALTA) 20 MG capsule TAKE ONE CAPSULE IN THE MORNING  . [DISCONTINUED] lidocaine (LIDODERM) 5 % On for 12 hours, then off for 12 hours.  . [DISCONTINUED] venlafaxine XR (EFFEXOR-XR) 37.5 MG 24 hr capsule TAKE 1 CAPSULE DAILY WITH BREAKFAST BY MOUTH.   Facility-Administered Encounter Medications as of 06/24/2018  Medication  . 0.9 %  sodium chloride infusion   Allergies  Allergen Reactions  . Sunscreen Spf30 [Solbar Pf Spf15] Hives, Itching and Rash  . Keflex [Cephalexin] Rash   Patient Active Problem List   Diagnosis Date Noted  . Thoracic back pain 02/03/2018  . Varicose veins of left lower extremity with complications 08/65/7846  . Malignant neoplasm of upper-outer quadrant of right breast in female, estrogen receptor positive ([place]) 11/22/2016  . Dermatitis 06/08/2016  .  Perennial allergic rhinitis 12/08/2015  . IBS (irritable bowel syndrome) 04/23/2013  . Anxiety 10/01/2012   Social History   Socioeconomic History  . Marital status: Single    Spouse name: Not on file  . Number of children: 0  . Years of education: Not on file  . Highest education level: Not on file  Occupational History  . Occupation: Secondary school teacher  Social Needs  . Financial resource strain: Not on file  . Food insecurity:    Worry: Not on file    Inability: Not on file  . Transportation  needs:    Medical: Not on file    Non-medical: Not on file  Tobacco Use  . Smoking status: Never Smoker  . Smokeless tobacco: Current User    Types: Snuff  Substance and Sexual Activity  . Alcohol use: Yes    Comment: 0-1 drinks daily  . Drug use: No  . Sexual activity: Not on file  Lifestyle  . Physical activity:    Days per week: 0 days    Minutes per session: Not on file  . Stress: To some extent  Relationships  . Social connections:    Talks on phone: Not on file    Gets together: Not on file    Attends religious service: Not on file    Active member of club or organization: Not on file    Attends meetings of clubs or organizations: Not on file    Relationship status: Living with partner  . Intimate partner violence:    Fear of current or ex partner: No    Emotionally abused: No    Physically abused: No    Forced sexual activity: No  Other Topics Concern  . Not on file  Social History Narrative  . Not on file    Ms. Satterfield family history includes Bone cancer in her paternal grandfather; Colon cancer in her other; Hyperlipidemia in her father and mother; Hypertension in her father and mother; Prostate cancer in her father.      Objective:    Vitals:   06/24/18 0931  BP: 114/70  Pulse: 80    Physical Exam; Well-developed white female in no acute distress, pleasant blood pressure 114/70 pulse 80, height 5 foot 2, weight 141, BMI of 25.7.  HEENT; nontraumatic normocephalic EOMI PERRLA sclera anicteric oral mucosa moist, Cardiovascular; regular rate and rhythm with S1-S2 no murmur rub or gallop, Pulmonary ;clear bilaterally, Abdomen ;soft, she is tender in the epigastrium no guarding or rebound no palpable mass or hepatosplenomegaly bowel sounds are present.  Rectal ;exam not done, Extremities ;no clubbing cyanosis or edema skin warm dry, Neuro psych ;alert and oriented, grossly nonfocal mood and affect appropriate       Assessment & Plan:   #19 42 year old  white female with 6 to 8-week history of intermittent epigastric pain/pressure associated with bloating and some sense of early satiety.  Symptoms have progressed over the past few weeks and present almost daily at this time.  Etiology is not clear.  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  IBS #5.  Anxiety  Plan; Will start omeprazole 40 mg p.o. every morning before breakfast, prescription sent CBC and C met Scheduled for CT scan of the abdomen and pelvis with contrast. Plans and recommendations  pending findings of above.  Amy S Esterwood PA-C 06/24/2018   Cc: Kathyrn Lass, MD   Addendum: Reviewed and agree with assessment and management plan. Pyrtle, Lajuan Lines, MD

## 2018-06-27 ENCOUNTER — Telehealth: Payer: Self-pay | Admitting: *Deleted

## 2018-06-27 ENCOUNTER — Other Ambulatory Visit: Payer: Self-pay | Admitting: *Deleted

## 2018-06-27 DIAGNOSIS — R6881 Early satiety: Secondary | ICD-10-CM

## 2018-06-27 DIAGNOSIS — R140 Abdominal distension (gaseous): Secondary | ICD-10-CM

## 2018-06-27 DIAGNOSIS — R1013 Epigastric pain: Secondary | ICD-10-CM

## 2018-06-27 NOTE — Telephone Encounter (Signed)
LM for the patient to please call me. I scheduled her for an [place]. [employer] would not approve the CT scan scheduled for 10-17. Scheduled [place]  At [facility] for Monday 10-14 at 7:00 am. Arrival time is 6:45 am.  NPO after midnight.

## 2018-06-27 NOTE — Telephone Encounter (Signed)
See phone note dated 06-27-18.

## 2018-06-27 NOTE — Telephone Encounter (Signed)
The patient called me back and got my message. She cannot do the Monday 07-01-18 for the [place]. She can do Tuesday the 15th. She is calling central scheduling for radiology at [phone] and will change the appointment. I told her we will look at the results of the [place] and determine whether she needs the CT scan or not.

## 2018-07-01 ENCOUNTER — Ambulatory Visit (HOSPITAL_COMMUNITY): Payer: Managed Care, Other (non HMO)

## 2018-07-02 ENCOUNTER — Encounter (HOSPITAL_COMMUNITY): Payer: Self-pay

## 2018-07-02 ENCOUNTER — Ambulatory Visit (HOSPITAL_COMMUNITY)
Admission: RE | Admit: 2018-07-02 | Discharge: 2018-07-02 | Disposition: A | Discharge status: Home / Self Care | Payer: Managed Care, Other (non HMO) | Source: Ambulatory Visit | Attending: Physician Assistant | Admitting: Physician Assistant

## 2018-07-02 DIAGNOSIS — R1013 Epigastric pain: Secondary | ICD-10-CM

## 2018-07-02 DIAGNOSIS — R140 Abdominal distension (gaseous): Secondary | ICD-10-CM

## 2018-07-02 DIAGNOSIS — R6881 Early satiety: Secondary | ICD-10-CM

## 2018-07-03 ENCOUNTER — Telehealth: Payer: Self-pay | Admitting: *Deleted

## 2018-07-03 ENCOUNTER — Other Ambulatory Visit: Payer: Self-pay | Admitting: *Deleted

## 2018-07-03 DIAGNOSIS — R1013 Epigastric pain: Secondary | ICD-10-CM

## 2018-07-03 DIAGNOSIS — R198 Other specified symptoms and signs involving the digestive system and abdomen: Secondary | ICD-10-CM

## 2018-07-03 NOTE — Telephone Encounter (Signed)
Called [facility] to order an abdominal ultrasound.  Got the appointment for [address]  Date is Tuesday 07-09-2018 at 8:00 . Arrival time is 7:40 am.  Called the patient to advise.

## 2018-07-03 NOTE — Telephone Encounter (Signed)
The patient spoke to her insurance company, [employer]. They suggested she have the [place] in a facility other than the hospital.  Per Nicoletta Ba PA,  Ok to schedule the Abdominal US at [facility], [address]  Date is Tuesday 07-09-2018 at 8:00 . Arrival time is 7:45 am.

## 2018-07-03 NOTE — Telephone Encounter (Signed)
Called the patient to advise our pre-certification coordinator is working on trying to get this pre-certed and she will find out what the patient is responsible for financially. We will cal her back.  She thanked me for calling and is on hold with Christella Scheuermann as we spoke.

## 2018-07-04 ENCOUNTER — Inpatient Hospital Stay: Admission: RE | Admit: 2018-07-04 | Payer: Managed Care, Other (non HMO) | Source: Ambulatory Visit

## 2018-07-05 ENCOUNTER — Telehealth: Payer: Self-pay | Admitting: Physician Assistant

## 2018-07-05 NOTE — Telephone Encounter (Signed)
We were trying to get a CT scan but insurance wont cover so now scheduled for [place] next week - she is on protonix 40 mg daily -, ok to take BID - if she is hurting bad should go to ER - probably needs a CT scan - need to R/O GB disease , partial obstruction etc

## 2018-07-05 NOTE — Telephone Encounter (Signed)
Spoke with the patient. Difficult to get to what her symptoms are.  She reports the "pain is just terrible. I don't know if I can stand it until Tuesday." She states she "can't eat" "the pain is much worse if I sit or lie down."  Pain is located in the center of the chest above the umbilicus. Intestinal gas and stomach is "very hard." "Drinking Gaviscon and taking Prilosec twice a day." Gaviscon gives temporary relief. Eating makes pain worse. She had diarrhea once today.

## 2018-07-05 NOTE — Telephone Encounter (Signed)
Patient says she will stick with her BID Prilosec. Aware of her options.

## 2018-07-06 ENCOUNTER — Emergency Department (HOSPITAL_BASED_OUTPATIENT_CLINIC_OR_DEPARTMENT_OTHER): Payer: Managed Care, Other (non HMO)

## 2018-07-06 ENCOUNTER — Encounter (HOSPITAL_BASED_OUTPATIENT_CLINIC_OR_DEPARTMENT_OTHER): Payer: Self-pay | Admitting: Adult Health

## 2018-07-06 ENCOUNTER — Other Ambulatory Visit: Payer: Self-pay

## 2018-07-06 ENCOUNTER — Emergency Department (HOSPITAL_BASED_OUTPATIENT_CLINIC_OR_DEPARTMENT_OTHER)
Admission: EM | Admit: 2018-07-06 | Discharge: 2018-07-06 | Disposition: A | Discharge status: Home / Self Care | Payer: Managed Care, Other (non HMO) | Attending: Emergency Medicine | Admitting: Emergency Medicine

## 2018-07-06 DIAGNOSIS — G8929 Other chronic pain: Secondary | ICD-10-CM | POA: Diagnosis not present

## 2018-07-06 DIAGNOSIS — Z79899 Other long term (current) drug therapy: Secondary | ICD-10-CM | POA: Insufficient documentation

## 2018-07-06 DIAGNOSIS — F17290 Nicotine dependence, other tobacco product, uncomplicated: Secondary | ICD-10-CM | POA: Insufficient documentation

## 2018-07-06 DIAGNOSIS — Z853 Personal history of malignant neoplasm of breast: Secondary | ICD-10-CM | POA: Insufficient documentation

## 2018-07-06 DIAGNOSIS — R1013 Epigastric pain: Secondary | ICD-10-CM | POA: Insufficient documentation

## 2018-07-06 LAB — CBC WITH DIFFERENTIAL/PLATELET
Abs Immature Granulocytes: 0.03 10*3/uL (ref 0.00–0.07)
Basophils Absolute: 0 10*3/uL (ref 0.0–0.1)
Basophils Relative: 1 %
EOS ABS: 0.1 10*3/uL (ref 0.0–0.5)
EOS PCT: 2 %
HEMATOCRIT: 44 % (ref 36.0–46.0)
HEMOGLOBIN: 14.3 g/dL (ref 12.0–15.0)
Immature Granulocytes: 1 %
LYMPHS ABS: 1.4 10*3/uL (ref 0.7–4.0)
LYMPHS PCT: 22 %
MCH: 31.4 pg (ref 26.0–34.0)
MCHC: 32.5 g/dL (ref 30.0–36.0)
MCV: 96.7 fL (ref 80.0–100.0)
MONO ABS: 0.6 10*3/uL (ref 0.1–1.0)
Monocytes Relative: 10 %
Neutro Abs: 4.2 10*3/uL (ref 1.7–7.7)
Neutrophils Relative %: 64 %
Platelets: 288 10*3/uL (ref 150–400)
RBC: 4.55 MIL/uL (ref 3.87–5.11)
RDW: 11.8 % (ref 11.5–15.5)
WBC: 6.4 10*3/uL (ref 4.0–10.5)
nRBC: 0 % (ref 0.0–0.2)

## 2018-07-06 LAB — COMPREHENSIVE METABOLIC PANEL
ALBUMIN: 4.1 g/dL (ref 3.5–5.0)
ALK PHOS: 56 U/L (ref 38–126)
ALT: 16 U/L (ref 0–44)
AST: 18 U/L (ref 15–41)
Anion gap: 10 (ref 5–15)
BUN: 9 mg/dL (ref 6–20)
CO2: 26 mmol/L (ref 22–32)
Calcium: 9.2 mg/dL (ref 8.9–10.3)
Chloride: 105 mmol/L (ref 98–111)
Creatinine, Ser: 0.72 mg/dL (ref 0.44–1.00)
GFR calc Af Amer: >60 mL/min (ref >60)
GLUCOSE: 83 mg/dL (ref 70–99)
POTASSIUM: 3.5 mmol/L (ref 3.5–5.1)
Sodium: 141 mmol/L (ref 135–145)
TOTAL PROTEIN: 7 g/dL (ref 6.5–8.1)
Total Bilirubin: 0.6 mg/dL (ref 0.3–1.2)

## 2018-07-06 LAB — URINALYSIS, ROUTINE W REFLEX MICROSCOPIC
Bilirubin Urine: NEGATIVE
GLUCOSE, UA: NEGATIVE mg/dL
HGB URINE DIPSTICK: NEGATIVE
KETONES UR: NEGATIVE mg/dL
LEUKOCYTES UA: NEGATIVE
Nitrite: NEGATIVE
PH: 6.5 (ref 5.0–8.0)
PROTEIN: NEGATIVE mg/dL
Specific Gravity, Urine: 1.02 (ref 1.005–1.030)

## 2018-07-06 LAB — PREGNANCY, URINE: PREG TEST UR: NEGATIVE

## 2018-07-06 LAB — LIPASE, BLOOD: LIPASE: 22 U/L (ref 11–51)

## 2018-07-06 MED ORDER — SODIUM CHLORIDE 0.9 % IV SOLN
INTRAVENOUS | Status: DC | PRN
  Administered 2018-07-06: 250 mL via INTRAVENOUS

## 2018-07-06 MED ORDER — SODIUM CHLORIDE 0.9 % IV BOLUS
1000.0000 mL | Freq: Once | INTRAVENOUS | Status: AC
  Administered 2018-07-06: 1000 mL via INTRAVENOUS

## 2018-07-06 MED ORDER — DICYCLOMINE HCL 20 MG PO TABS: 20.0000 mg | ORAL_TABLET | Freq: Two times a day (BID) | ORAL | 0 refills | Status: DC

## 2018-07-06 MED ORDER — IOPAMIDOL (ISOVUE-300) INJECTION 61%
100.0000 mL | Freq: Once | INTRAVENOUS | Status: AC | PRN
  Administered 2018-07-06: 100 mL via INTRAVENOUS

## 2018-07-06 MED ORDER — FAMOTIDINE IN NACL 20-0.9 MG/50ML-% IV SOLN
20.0000 mg | Freq: Once | INTRAVENOUS | Status: AC
  Administered 2018-07-06: 20 mg via INTRAVENOUS
  Filled 2018-07-06: qty 50

## 2018-07-06 MED ORDER — SUCRALFATE 1 G PO TABS
1.0000 g | ORAL_TABLET | Freq: Once | ORAL | Status: AC
  Administered 2018-07-06: 1 g via ORAL
  Filled 2018-07-06: qty 1

## 2018-07-06 MED ORDER — PANTOPRAZOLE SODIUM 20 MG PO TBEC: 20.0000 mg | DELAYED_RELEASE_TABLET | Freq: Every day | ORAL | 0 refills | Status: DC

## 2018-07-06 NOTE — ED Triage Notes (Addendum)
Presents with upper mid and left quadrant abdominal pain that has been worsening since Monday. SHe has an [place] scheduled for Tuesday next week. She states, "this is so bad and I can not wait and I can not eat" Denies vomiting endorses nausea. The pain is constantbut is intermittently worse at ttimes. STanding makes pain a little better, sitting and eating make pain worse.

## 2018-07-06 NOTE — ED Provider Notes (Signed)
[facility] DEPART[facility] Provider Note   CSN: [id number] Arrival date & time: 07/06/18  1348     History   Chief Complaint Chief Complaint  Patient presents with  . Abdominal Pain    HPI Allison Mcclure is a 42 y.o. female who presents with abdominal pain. 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 She follows with GI. Has CT and Korea scheduled but felt she could not wait.  She describes ~2 months of mostly epigastric abdominal pain which is worsened with eating. It is a pressure like pain. Today she also has LUQ pain. The pain is also generalized at times but she feels is mostly in the upper abdomen. She reports associated bloating and nausea. No vomiting. She was started on Omeprazole twice daily and takes Gaviscon for extra relief when needed. This week she's had some diarrhea as well which isn't severe or bloody. No fevers. Had EGD in March 2019 which was normal. No prior abdominal surgeries.  HPI  Past Medical History:  Diagnosis Date  . Anxiety   . BRCA negative 2017   MyRisk neg  . Breast cancer ([place]) 12/2015   ER/PR +, Her2 neu -; neg lymph nodes; s/p lumpectomy, radiation, attempted tamoxifen with side effects  . Cervical dysplasia   . Gastrointestinal disorder   . GERD (gastroesophageal reflux disease)   . IBS (irritable bowel syndrome)   . Ovarian cyst     Patient Active Problem List   Diagnosis Date Noted  . Thoracic back pain 02/03/2018  . Varicose veins of left lower extremity with complications 32/20/2542  . Malignant neoplasm of upper-outer quadrant of right breast in female, estrogen receptor positive ([place]) 11/22/2016  . Dermatitis 06/08/2016  . Perennial allergic rhinitis 12/08/2015  . IBS (irritable bowel syndrome) 04/23/2013  . Anxiety 10/01/2012    Past Surgical History:  Procedure Laterality Date  . arm  surgery     left  . BREAST LUMPECTOMY WITH RADIOACTIVE SEED AND SENTINEL LYMPH NODE BIOPSY Right 03/01/2016   Procedure: RIGHT BREAST LUMPECTOMY WITH RADIOACTIVE SEED AND SENTINEL LYMPH NODE BIOPSY;  Surgeon: Stark Klein, MD;  Location: [facility];  Service: General;  Laterality: Right;  . CRYOTHERAPY    . EXCISION MASS LOWER EXTREMETIES Right 03/01/2016   Procedure: EXCISION MASS RIGHT THIGH;  Surgeon: Stark Klein, MD;  Location: [facility] [place] [facility];  Service: General;  Laterality: Right;  . MOUTH SURGERY     "gum surgery" x 2  . TONSILLECTOMY    . WISDOM TOOTH EXTRACTION       OB History    Gravida  0   Para  0   Term  0   Preterm  0   AB  0   Living  0     SAB  0   TAB  0   Ectopic  0   Multiple  0   Live Births  0            Home Medications    Prior to Admission medications   Medication Sig Start Date End Date Taking? Authorizing Provider  ALPRAZolam Duanne Moron) 0.5 MG tablet Take 1 tablet (0.5 mg total) by mouth 2 (two) times daily as needed for anxiety. Patient taking differently: Take 0.5 mg by mouth as needed for anxiety.  08/21/16   Holley Bouche, NP  loratadine (ALAVERT) 10 MG tablet Take 10 mg  by mouth daily as needed.    [provider]  ranitidine (ZANTAC) 150 MG tablet Take 1 tablet (150 mg total) 2 (two) times daily as needed by mouth for heartburn. Patient taking differently: Take 150 mg by mouth as needed for heartburn.  08/06/17   Pyrtle, Lajuan Lines, MD  triamcinolone (NASACORT) 55 MCG/ACT AERO nasal inhaler Place 2 sprays into the nose as needed.     [provider]    Family History Family History  Problem Relation Age of Onset  . Hyperlipidemia Father   . Prostate cancer Father   . Hypertension Father   . Hypertension Mother   . Hyperlipidemia Mother   . Bone cancer Paternal Grandfather   . Colon cancer Other        PGGF    Social History Social History   Tobacco Use  . Smoking status:  Never Smoker  . Smokeless tobacco: Current User    Types: Snuff  Substance Use Topics  . Alcohol use: Yes    Comment: 0-1 drinks daily  . Drug use: No     Allergies   Sunscreen spf30 [solbar pf spf15] and Keflex [cephalexin]   Review of Systems Review of Systems  Constitutional: Negative for chills and fever.  Respiratory: Negative for shortness of breath.   Cardiovascular: Negative for chest pain.  Gastrointestinal: Positive for abdominal pain, diarrhea and nausea. Negative for abdominal distention, blood in stool, constipation and vomiting.  Genitourinary: Negative for dysuria and pelvic pain.     Physical Exam Updated Vital Signs BP 123/79 (BP Location: Left Arm)   Pulse 94   Temp 98.5 F (36.9 C) (Oral)   Resp 19   Ht _0  (1.575 m)   Wt 63.5 kg   SpO2 100%   BMI 25.61 kg/m   Physical Exam  Constitutional: She is oriented to person, place, and time. She appears well-developed and well-nourished. No distress.  HENT:  Head: Normocephalic and atraumatic.  Eyes: Pupils are equal, round, and reactive to light. Conjunctivae are normal. Right eye exhibits no discharge. Left eye exhibits no discharge. No scleral icterus.  Neck: Normal range of motion.  Cardiovascular: Normal rate and regular rhythm.  Pulmonary/Chest: Effort normal and breath sounds normal. No respiratory distress.  Abdominal: Soft. Bowel sounds are normal. She exhibits no distension and no mass. There is tenderness (generalized, worse in epigastrium). There is no rebound and no guarding. No hernia.  Neurological: She is alert and oriented to person, place, and time.  Skin: Skin is warm and dry.  Psychiatric: She has a normal mood and affect. Her behavior is normal.  Nursing note and vitals reviewed.    ED Treatments / Results  Labs (all labs ordered are listed, but only abnormal results are displayed) Labs Reviewed  CBC WITH DIFFERENTIAL/PLATELET  COMPREHENSIVE METABOLIC PANEL  LIPASE, BLOOD    URINALYSIS, ROUTINE W REFLEX MICROSCOPIC  PREGNANCY, URINE    EKG None  Radiology No results found.  Procedures Procedures (including critical care time)  Medications Ordered in ED Medications - No data to display   Initial Impression / Assessment and Plan / ED Course  I have reviewed the triage vital signs and the nursing notes.  Pertinent labs & imaging results that were available during my care of the patient were reviewed by me and considered in my medical decision making (see chart for details).  42 year old female presents with upper abdominal pain which is chronic but worsening over the past week. Her vitals  are normal. She has diffuse tenderness on exam but describes mostly upper abdominal symptoms. Blood work and UA are normal. CT and Korea were ordered by GI. I believe CT will give Korea the most detailed picture of anything emergent today.   CT is negative for obvious abnormalities. Discussed with pt and husband. She is somewhat dismayed about not receiving a specific diagnosis which is understandable. It sounds like a functional problem. Will try Protonix and Bentyl. Encouraged keeping a food diary and trying lifestyle modifications. Advised f/u with GI.   Final Clinical Impressions(s) / ED Diagnoses   Final diagnoses:  Chronic epigastric pain    ED Discharge Orders    None       Recardo Evangelist, PA-C 07/06/18 1649    Dorie Rank, MD 07/07/18 [phone]

## 2018-07-06 NOTE — Discharge Instructions (Addendum)
Some guidelines to follow: -eating smaller meals -limiting citrus, caffeine, chocolate, and high-fat foods -adding foods to improve digestion -drinking water instead of carbonated drinks and alcohol -avoiding late-night meals and tight clothing -keeping upright for 2 hours after eating -raising the head of your bed 6 to 8 inches using risers, blocks, or wedges Try keeping a food log and see which foods bother you or not Start Protonix twice a day. Stop Omeprazole Start Bentyl Please follow up with GI

## 2018-07-06 NOTE — ED Notes (Signed)
ED Provider at bedside. 

## 2018-07-06 NOTE — ED Notes (Signed)
Ct waiting for results of labs and urine pregnancy prior to imaging

## 2018-07-08 ENCOUNTER — Telehealth: Payer: Self-pay | Admitting: Physician Assistant

## 2018-07-08 NOTE — Telephone Encounter (Signed)
Patient did go to the ED last week. See that encounter. Please advise. She is very frustrated with her symptoms. Thanks

## 2018-07-09 ENCOUNTER — Other Ambulatory Visit: Payer: Managed Care, Other (non HMO)

## 2018-07-09 NOTE — Telephone Encounter (Signed)
Patient is notified. She seems very surprised and perplexed that celiac disease is not her diagnosis. She has researched this on line and "my symptoms match this perfectly." She denies nausea at this point. She complains of arthragias and myalgias on a daily basis.  She will await instructions from the providers.

## 2018-07-09 NOTE — Telephone Encounter (Signed)
I am sorry she is miserable - make sure she has rx for Zofran 4 mg po and ask he to try taking it regularly q 6-8 hrs , in addition to protonix 40 mg daily  While we do further testing .  She had EGD in March which was normal , and had small bowel bx which were negative for celiac disease , also has had celiac makers in the past which were normal , so this is not celiac /gluten  Related    Ulice Dash - need some input- just had CT in ER after we were fighting with insurance who was insisting she have [place] before CT .  Ct was normal, and labs all normal .   I am thinking CCK HIDA scan next , consider GE scan , and ? If CCK HIDA normal starting trial of Buspar BID for functional  Dyspepsia  Beth , also let her know I am discussing further workup with Dr Hilarie Fredrickson

## 2018-07-10 NOTE — Telephone Encounter (Signed)
Discussed in detail with the patient. She is pleased that she has options and states "thank you" and "it is so good to know they care." Her plan is to carefully consider her options and call back with her decision.

## 2018-07-10 NOTE — Telephone Encounter (Signed)
I recommend that we pursue SIBO breath testing Eustaquio Maize, Carla Drape can help you order this if not familiar) CCK HIDA also reasonable All bowel biopsies are goal standard for celiac disease so this is very unlikely.  That does not mean she does not have gluten sensitivity and if she wishes to follow a gluten-free diet for 4 weeks to see if symptoms improve, this is very reasonable  Would continue work-up with the above 2 tests and tell her we will continue to search for answers for her

## 2019-04-30 ENCOUNTER — Telehealth: Payer: Self-pay | Admitting: *Deleted

## 2019-04-30 NOTE — Telephone Encounter (Signed)
Received call from Allison Mcclure stating she has not been seen in the office since 2018.  Allison Mcclure states she stopped taking tamoxifen in 2018 and would like to speak with Dr. Lindi Adie regarding re starting anti-estrogen therapy and an overall yearly follow up apt.  Apt scheduled for 8/18, Allison Mcclure verbalized understanding of date and time.

## 2019-05-01 NOTE — Assessment & Plan Note (Signed)
Right lumpectomy 03/01/2016: IDC grade 1, 0.7 cm, margins negative, 0/4 lymph nodes negative, ER 100%, PR 100%, Ki-67 10%, HER-2 negative ratio 1.32, T1 BN 0 stage IA Oncotype DX score 8: 6% risk of recurrence low risk Adjuvant radiation therapy from 05/02/2016 to09/22/2017  Treatment plan: Adjuvant antiestrogen therapy with tamoxifen 5 mg daily 5-10 years, started11/09/2015  (homozygous for CYP2D6 null variant)  Tamoxifen toxicities: Tolerating it extremely well.  Inflammation of right breast It has not improved on Keflex but in fact her symptoms got markedly worse. She is itching profusely through the breast and into her abdominal area as well as the left axilla.I recommended that she receive Medrol Dosepak and use topical calamine lotion or Benadryl of Zofran.  Return to clinic in 1 year for follow-up

## 2019-05-05 NOTE — Progress Notes (Signed)
Patient Care Team: Kathyrn Lass, MD as PCP - General (Family Medicine)  DIAGNOSIS:    ICD-10-CM   1. Malignant neoplasm of upper-outer quadrant of right breast in female, estrogen receptor positive ([place])  C50.411    Z17.0     SUMMARY OF ONCOLOGIC HISTORY: Oncology History  Malignant neoplasm of upper-outer quadrant of right breast in female, estrogen receptor positive ([place])  01/03/2016 Initial Diagnosis   Screening detected asymmetry in the right breast, [place] measured 8 mm at 10:00 position axillary normal. Right breast biopsy: Invasive ductal carcinoma with LVI, grade 2,ER 100%, PR 100%, Ki-67 10%, HER-2 negative ratio 1.23, T1b N0 stage IA.   03/01/2016 Surgery   Right lumpectomy [facility]): IDC grade 1, 0.7 cm, margins negative, 0/4 lymph nodes negative, ER 100%, PR 100%, Ki-67 10%, HER-2 negative ratio 1.32, T1 BN 0 stage IA, Oncotype DX 8, 6% ROR   03/01/2016 Oncotype testing   Recurrence score: 8; ROR 6% (low-risk)   05/02/2016 - 06/16/2016 Radiation Therapy   Adjuvant radiation therapy [name]). Right breast/ 50.4 Gy in 28 fx.  Boost / 10 Gy in 5 fx   07/2016 - 2018 Anti-estrogen oral therapy   Tamoxifen 20 mg daily. Planned duration of therapy: 5-10 years      CHIEF COMPLIANT: Follow-up to discuss reinitiation of anti-estrogen therapy  INTERVAL HISTORY: Allison Mcclure is a 43 y.o. with above-mentioned history of right breast cancer treated with lumpectomy, adjuvant radiation therapy, and anti-estrogen therapy with tamoxifen, which she stopped in 2018. I last saw her two years ago. Mammogram on 01/20/19 showed no evidence of malignancy bilaterally. She presents to the clinic today to discuss reinitiation of tamoxifen therapy.  Initially she took Tamoxifen for 5 months and decided to stop it because she does not like the way it made her feel.  Since then her symptoms continue to be persistent.  She has diffuse body aches and pains especially in bilateral feet.  She has been using  CBD oil which has been helping her significantly.  She also has chronic pain in the breast and it constantly worries her.  Her mammograms have been coming up normal.  She had an episode of radiation recall which resolved.  And she has not had any further problems since then.  She is still not interested in taking tamoxifen.  REVIEW OF SYSTEMS:   Constitutional: Denies fevers, chills or abnormal weight loss Eyes: Denies blurriness of vision Ears, nose, mouth, throat, and face: Denies mucositis or sore throat Respiratory: Denies cough, dyspnea or wheezes Cardiovascular: Denies palpitation, chest discomfort Gastrointestinal: Denies nausea, heartburn or change in bowel habits Skin: Denies abnormal skin rashes Lymphatics: Denies new lymphadenopathy or easy bruising Neurological: Denies numbness, tingling or new weaknesses, complains of diffuse body aches and pains especially in bilateral feet Behavioral/Psych: Mood is stable, no new changes  Extremities: No lower extremity edema Breast: Tenderness in the right breast All other systems were reviewed with the patient and are negative.  I have reviewed the past medical history, past surgical history, social history and family history with the patient and they are unchanged from previous note.  ALLERGIES:  is allergic to sunscreen spf30 [solbar pf spf15] and keflex [cephalexin].  MEDICATIONS:  Current Outpatient Medications  Medication Sig Dispense Refill   ALPRAZolam (XANAX) 0.5 MG tablet Take 1 tablet (0.5 mg total) by mouth 2 (two) times daily as needed for anxiety. (Patient taking differently: Take 0.5 mg by mouth as needed for anxiety. ) 50 tablet 0  dicyclomine (BENTYL) 20 MG tablet Take 1 tablet (20 mg total) by mouth 2 (two) times daily. 20 tablet 0   loratadine (ALAVERT) 10 MG tablet Take 10 mg by mouth daily as needed.     pantoprazole (PROTONIX) 20 MG tablet Take 1 tablet (20 mg total) by mouth daily. 30 tablet 0   ranitidine  (ZANTAC) 150 MG tablet Take 1 tablet (150 mg total) 2 (two) times daily as needed by mouth for heartburn. (Patient taking differently: Take 150 mg by mouth as needed for heartburn. ) 1 tablet 0   triamcinolone (NASACORT) 55 MCG/ACT AERO nasal inhaler Place 2 sprays into the nose as needed.      Current Facility-Administered Medications  Medication Dose Route Frequency Provider Last Rate Last Dose   0.9 %  sodium chloride infusion  500 mL Intravenous Once Pyrtle, Lajuan Lines, MD        PHYSICAL EXAMINATION: ECOG PERFORMANCE STATUS: 1 - Symptomatic but completely ambulatory  Vitals:   05/06/19 0935  BP: 122/71  Pulse: 70  Resp: 18  Temp: 98.7 F (37.1 C)  SpO2: 100%   Filed Weights   05/06/19 0935  Weight: 137 lb 14.4 oz (62.6 kg)    GENERAL: alert, no distress and comfortable SKIN: skin color, texture, turgor are normal, no rashes or significant lesions EYES: normal, Conjunctiva are pink and non-injected, sclera clear OROPHARYNX: no exudate, no erythema and lips, buccal mucosa, and tongue normal  NECK: supple, thyroid normal size, non-tender, without nodularity LYMPH: no palpable lymphadenopathy in the cervical, axillary or inguinal LUNGS: clear to auscultation and percussion with normal breathing effort HEART: regular rate & rhythm and no murmurs and no lower extremity edema ABDOMEN: abdomen soft, non-tender and normal bowel sounds MUSCULOSKELETAL: no cyanosis of digits and no clubbing  NEURO: alert & oriented x 3 with fluent speech, no focal motor/sensory deficits EXTREMITIES: No lower extremity edema    LABORATORY DATA:  I have reviewed the data as listed CMP Latest Ref Rng & Units 07/06/2018 06/24/2018 01/19/2016  Glucose 70 - 99 mg/dL 83 112(H) 99  BUN 6 - 20 mg/dL 9 14 15.1  Creatinine 0.44 - 1.00 mg/dL 0.72 0.80 0.9  Sodium 135 - 145 mmol/L 141 136 140  Potassium 3.5 - 5.1 mmol/L 3.5 4.3 3.7  Chloride 98 - 111 mmol/L 105 103 -  CO2 22 - 32 mmol/L _0 Calcium 8.9  - 10.3 mg/dL 9.2 9.3 9.2  Total Protein 6.5 - 8.1 g/dL 7.0 6.9 6.6  Total Bilirubin 0.3 - 1.2 mg/dL 0.6 0.4 0.36  Alkaline Phos 38 - 126 U/L 56 55 55  AST 15 - 41 U/L _1 ALT 0 - 44 U/L _2 Lab Results  Component Value Date   WBC 6.4 07/06/2018   HGB 14.3 07/06/2018   HCT 44.0 07/06/2018   MCV 96.7 07/06/2018   PLT 288 07/06/2018   NEUTROABS 4.2 07/06/2018    ASSESSMENT & PLAN:  Malignant neoplasm of upper-outer quadrant of right breast in female, estrogen receptor positive ([facility]) Right lumpectomy 03/01/2016: IDC grade 1, 0.7 cm, margins negative, 0/4 lymph nodes negative, ER 100%, PR 100%, Ki-67 10%, HER-2 negative ratio 1.32, T1 BN 0 stage IA Oncotype DX score 8: 6% risk of recurrence low risk Adjuvant radiation therapy from 05/02/2016 to09/22/2017  Treatment plan: Adjuvant antiestrogen therapy with tamoxifen 5 mg daily 5-10 years, started11/09/2015  (homozygous for CYP2D6 null variant)  Tamoxifen toxicities: She did not want  to take it after taking it for 5 months he discontinued it.. She still does not want to reinitiate tamoxifen therapy.  Radiation recall: 2018 I recommended that she continue her annual checkups with Dr. Barry Dienes and get her mammograms every year. Return to clinic on an as-needed basis.    No orders of the defined types were placed in this encounter.  The patient has a good understanding of the overall plan. she agrees with it. she will call with any problems that may develop before the next visit here.  Nicholas Lose, MD 05/06/2019  Julious Oka Dorshimer am acting as scribe for Dr. Nicholas Lose.  I have reviewed the above documentation for accuracy and completeness, and I agree with the above.

## 2019-05-06 ENCOUNTER — Inpatient Hospital Stay: Payer: Managed Care, Other (non HMO) | Attending: Hematology and Oncology | Admitting: Hematology and Oncology

## 2019-05-06 ENCOUNTER — Other Ambulatory Visit: Payer: Self-pay

## 2019-05-06 DIAGNOSIS — Z79810 Long term (current) use of selective estrogen receptor modulators (SERMs): Secondary | ICD-10-CM | POA: Insufficient documentation

## 2019-05-06 DIAGNOSIS — C50411 Malignant neoplasm of upper-outer quadrant of right female breast: Secondary | ICD-10-CM | POA: Insufficient documentation

## 2019-05-06 DIAGNOSIS — G8929 Other chronic pain: Secondary | ICD-10-CM | POA: Insufficient documentation

## 2019-05-06 DIAGNOSIS — Z170 Estrogen receptor positive status [ER+]: Secondary | ICD-10-CM | POA: Diagnosis not present

## 2019-05-06 MED ORDER — CETIRIZINE HCL 5 MG/5ML PO SOLN: 5.0000 mg | Freq: Every day | ORAL | Status: DC

## 2019-05-06 MED ORDER — VITAMIN D3 25 MCG (1000 UNIT) PO TABS: 1000.0000 [IU] | ORAL_TABLET | Freq: Every day | ORAL | Status: DC

## 2019-05-06 MED ORDER — FAMOTIDINE 20 MG PO TABS: 20.0000 mg | ORAL_TABLET | Freq: Two times a day (BID) | ORAL | Status: DC

## 2019-05-16 ENCOUNTER — Telehealth: Payer: Self-pay | Admitting: Internal Medicine

## 2019-05-16 MED ORDER — OMEPRAZOLE 40 MG PO CPDR: 40.0000 mg | DELAYED_RELEASE_CAPSULE | Freq: Every day | ORAL | 1 refills | Status: DC

## 2019-05-16 NOTE — Telephone Encounter (Signed)
Rx sent 

## 2019-05-16 NOTE — Telephone Encounter (Signed)
Pt is requesting rf for omeprazole sent to [facility] on [address].

## 2019-06-08 ENCOUNTER — Other Ambulatory Visit: Payer: Self-pay | Admitting: Internal Medicine

## 2019-06-26 ENCOUNTER — Telehealth: Payer: Self-pay

## 2019-06-26 MED ORDER — FLUCONAZOLE 150 MG PO TABS: 150.0000 mg | ORAL_TABLET | Freq: Once | ORAL | 0 refills | Status: AC

## 2019-06-26 NOTE — Telephone Encounter (Signed)
Pt calling; is having an issue with a yeast inf.  Is hoping to get diflucan called in.  She has tried a 3d tx and has started her second round of it.  It's not helping. 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  [phone]

## 2019-06-26 NOTE — Telephone Encounter (Signed)
Rx diflucan eRxd. Pls notify pt. F/u if sx persist after tx.

## 2019-06-26 NOTE — Telephone Encounter (Signed)
Little discharge, highly irritated (feels like shes on fire-per pts words), itchiness over the weekend. Says 3 day treatment has worked before, apparently not this one.

## 2019-06-26 NOTE — Telephone Encounter (Signed)
Pt aware.

## 2019-06-27 IMAGING — CR DG RIBS W/ CHEST 3+V*L*
3 series · 3 of 3 positions shown · non-contrast
Comparison: PA and lateral chest 04/24/2014.

CLINICAL DATA: Midthoracic spine and left chest wall pain for 2
months. No known injury.

EXAM:
LEFT RIBS AND CHEST - 3+ VIEW

[w chest pa]
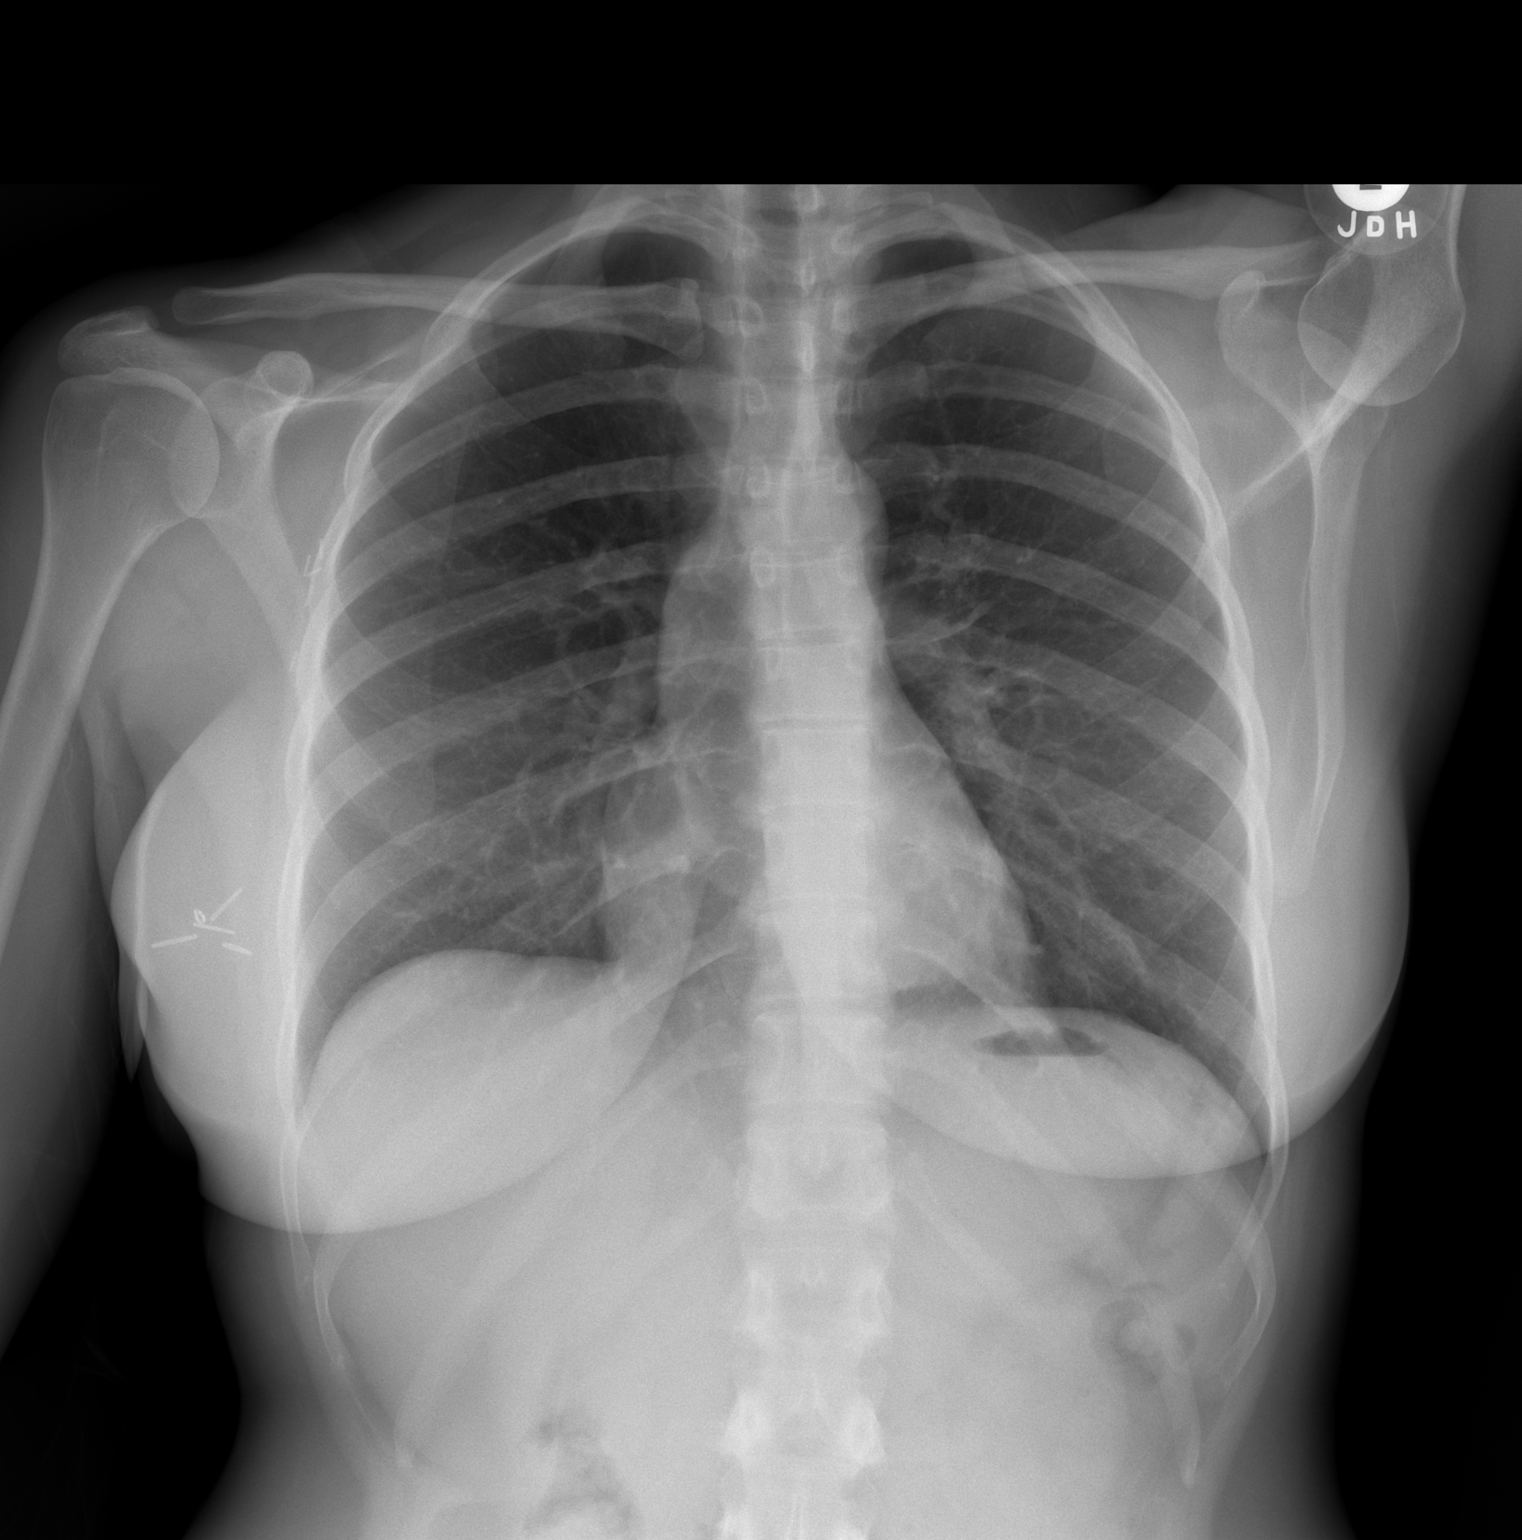

[w ribs ap lower left]
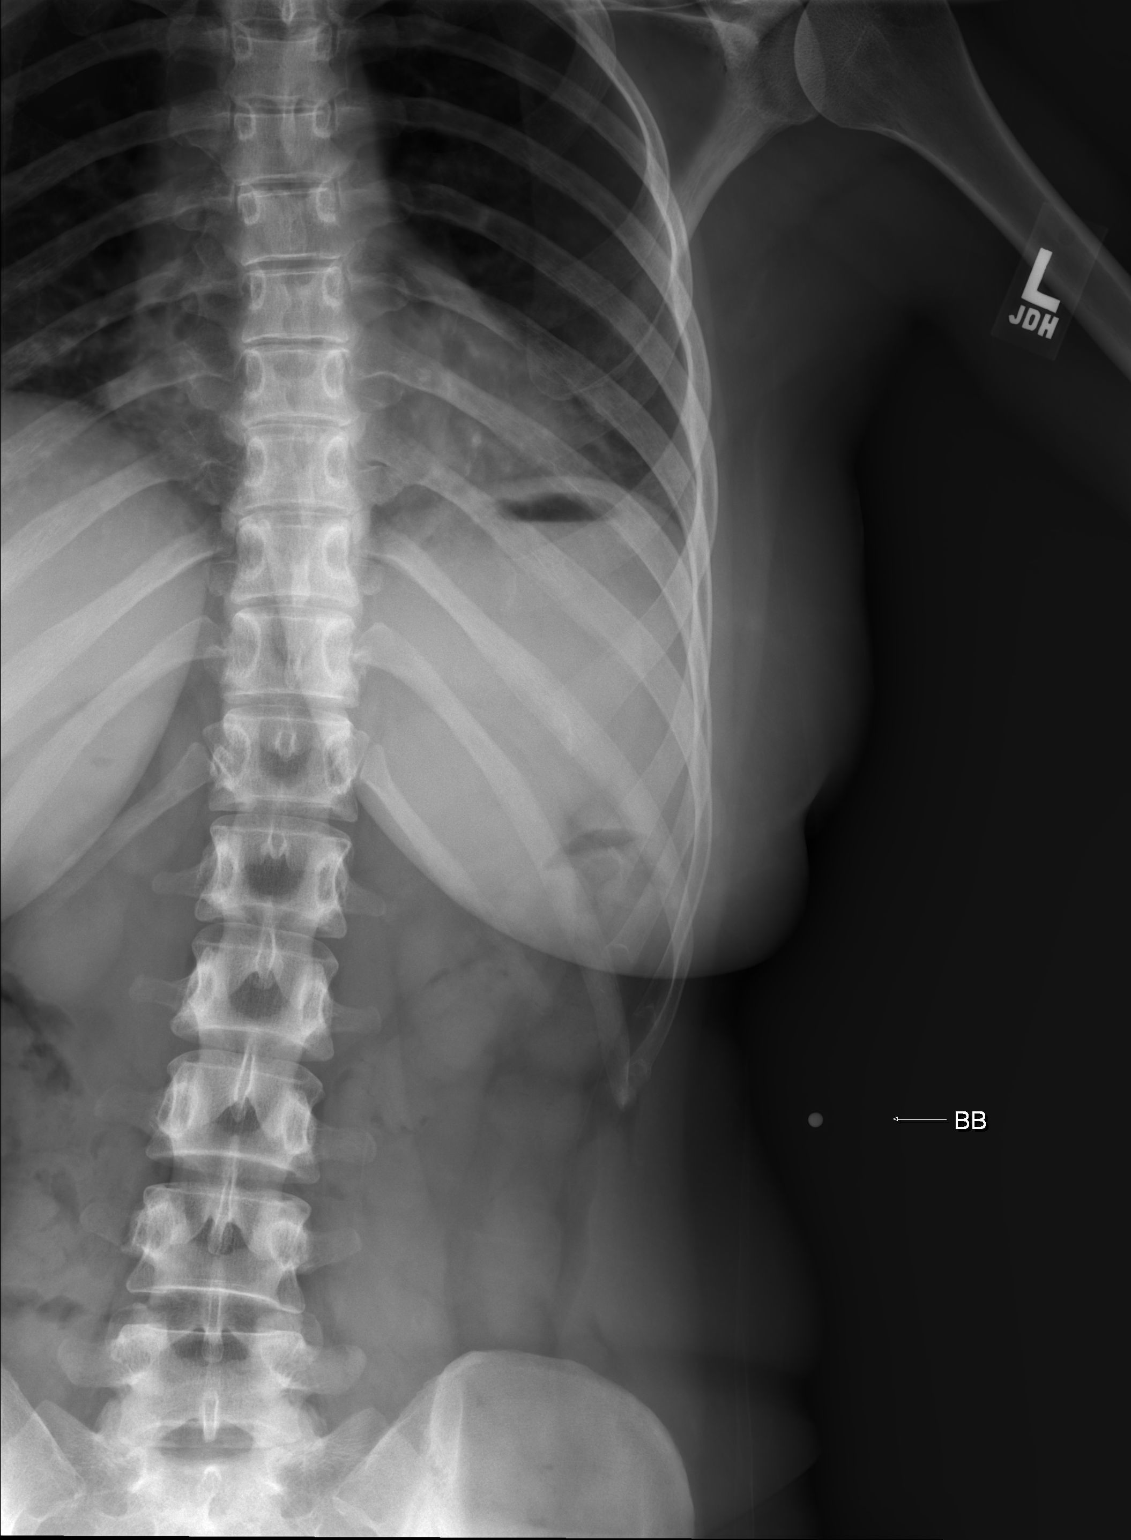

[w ribs obl left]
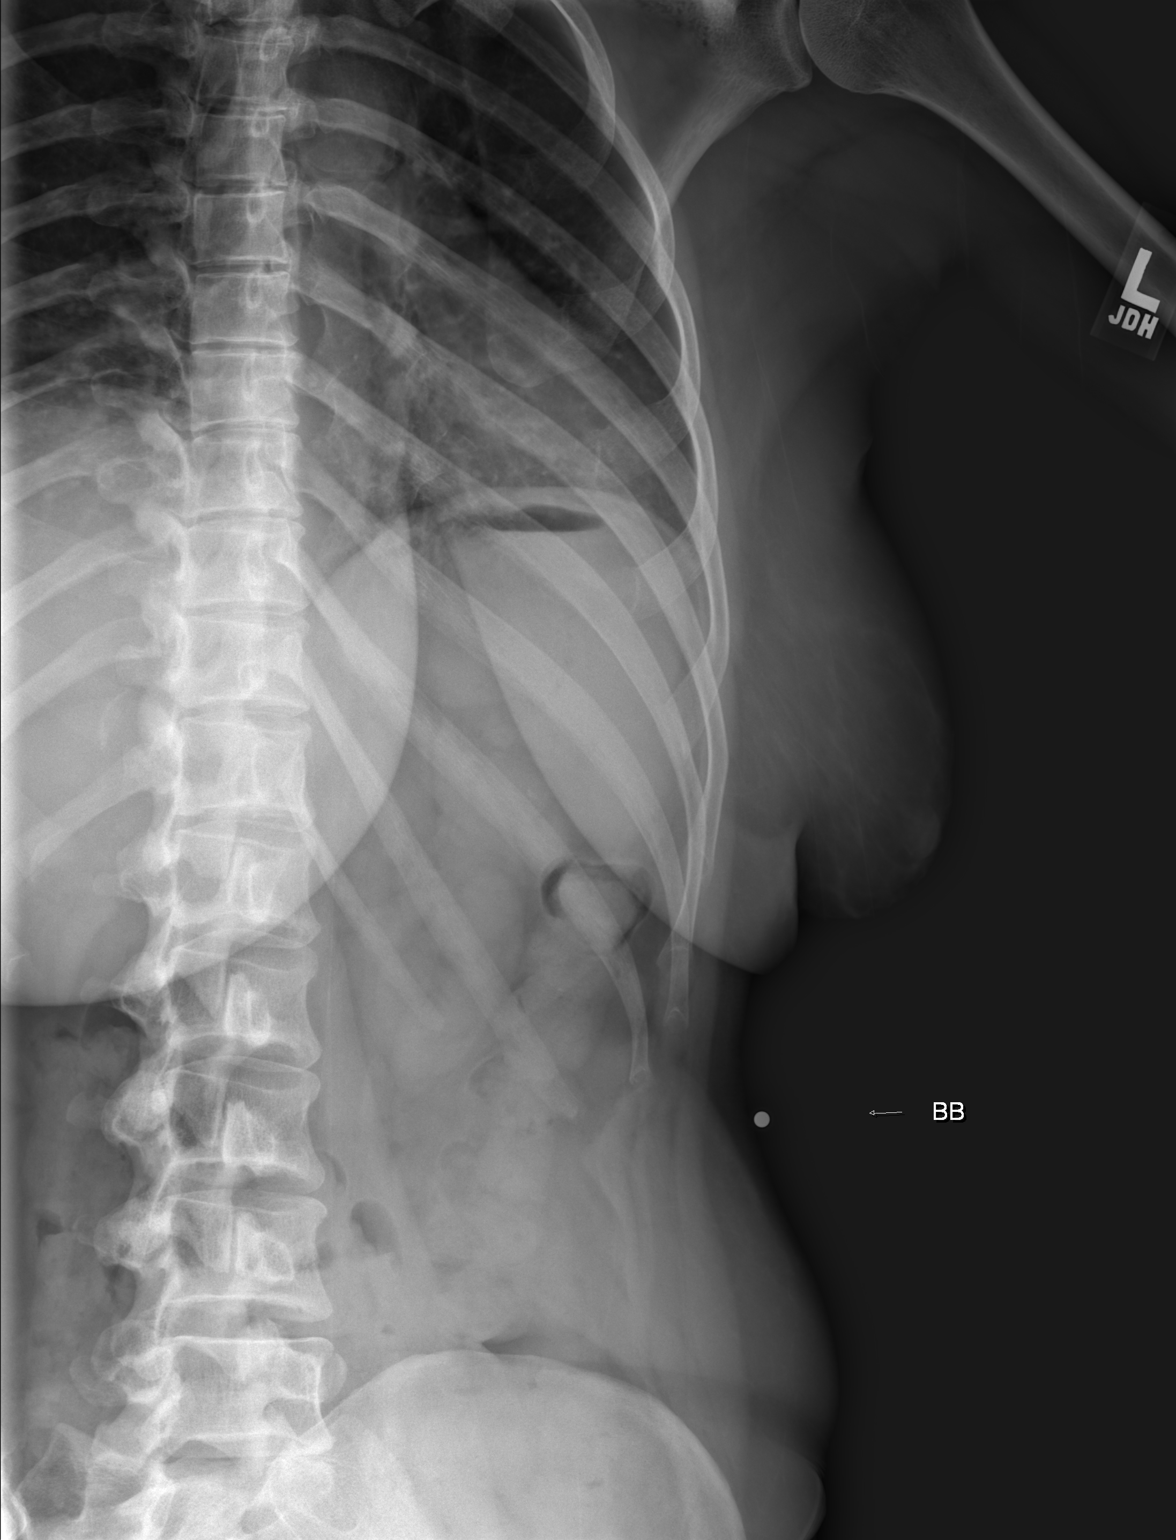

[3 of 3 positions shown; findings below may reference images not displayed]

FINDINGS: The lungs are clear. Heart size is normal. No pneumothorax or
pleural effusion. No fracture or other focal bony abnormality.
Surgical clips projecting in the right breast are noted.
IMPRESSION: Negative for fracture.  No acute abnormality.

## 2019-07-05 ENCOUNTER — Other Ambulatory Visit: Payer: Self-pay | Admitting: Internal Medicine

## 2019-10-10 ENCOUNTER — Other Ambulatory Visit: Payer: Self-pay | Admitting: Internal Medicine

## 2019-11-20 ENCOUNTER — Ambulatory Visit (INDEPENDENT_AMBULATORY_CARE_PROVIDER_SITE_OTHER): Payer: Managed Care, Other (non HMO) | Admitting: Obstetrics and Gynecology

## 2019-11-20 ENCOUNTER — Encounter: Payer: Self-pay | Admitting: Obstetrics and Gynecology

## 2019-11-20 ENCOUNTER — Other Ambulatory Visit (HOSPITAL_COMMUNITY)
Admission: RE | Admit: 2019-11-20 | Discharge: 2019-11-20 | Disposition: A | Discharge status: Home / Self Care | Payer: Managed Care, Other (non HMO) | Source: Ambulatory Visit | Attending: Obstetrics and Gynecology | Admitting: Obstetrics and Gynecology

## 2019-11-20 ENCOUNTER — Other Ambulatory Visit: Payer: Self-pay

## 2019-11-20 VITALS — BP 100/70 | Ht 62.0 in | Wt 132.0 lb

## 2019-11-20 DIAGNOSIS — Z01411 Encounter for gynecological examination (general) (routine) with abnormal findings: Secondary | ICD-10-CM

## 2019-11-20 DIAGNOSIS — Z853 Personal history of malignant neoplasm of breast: Secondary | ICD-10-CM

## 2019-11-20 DIAGNOSIS — Z1151 Encounter for screening for human papillomavirus (HPV): Secondary | ICD-10-CM | POA: Insufficient documentation

## 2019-11-20 DIAGNOSIS — Z170 Estrogen receptor positive status [ER+]: Secondary | ICD-10-CM | POA: Diagnosis not present

## 2019-11-20 DIAGNOSIS — Z124 Encounter for screening for malignant neoplasm of cervix: Secondary | ICD-10-CM

## 2019-11-20 DIAGNOSIS — C50411 Malignant neoplasm of upper-outer quadrant of right female breast: Secondary | ICD-10-CM

## 2019-11-20 DIAGNOSIS — N939 Abnormal uterine and vaginal bleeding, unspecified: Secondary | ICD-10-CM

## 2019-11-20 DIAGNOSIS — Z01419 Encounter for gynecological examination (general) (routine) without abnormal findings: Secondary | ICD-10-CM

## 2019-11-20 DIAGNOSIS — N938 Other specified abnormal uterine and vaginal bleeding: Secondary | ICD-10-CM | POA: Diagnosis not present

## 2019-11-20 NOTE — Progress Notes (Signed)
PCP:  Kathyrn Lass, MD   Chief Complaint  Patient presents with  . Vaginal Bleeding    spotting this past weekend  . Gynecologic Exam    annual     HPI:      [name] is a 44 y.o. No obstetric history on file. who LMP was Patient's last menstrual period was 11/02/2019 (exact date)., presents today for her annual examination.  Her menses are regular every 28-30 days, lasting 5 days.  Dysmenorrhea mod, worse at night, occurring first 1-2 days of flow. NSAIDs/heating pad mildly helpful. She does not have intermenstrual bleeding usually but had 2-3 days light spotting this past wknd. Unusual for pt. No increased stress. Neg home UPT.  Sex activity: rarely sex active--contraception - none. Trying to be careful. Can't have estrogen due to breast cancer, ER/PR +, Her2 neu -. Pt had failed IUD insertion in past due to cx "wouldn't open up". S/p cryotherapy. Not interested in [facility]. Last Pap: 07/07/16 Results were: no abnormalities /neg HPV DNA  Hx of STDs: HPV  Last mammogram: 01/20/19  Results were: normal--routine follow[facility]up in 12 months at [facility]. S/P RT breast  Intraductal carcinoma with lumpectomy, radiation, and tamoxifen tx but couldn't tolerate tamoxifen. Not on any meds for tx.   There is no FH of breast cancer. There is no FH of ovarian cancer. The patient does self-breast exams. Pt had neg MyRisk testing 2017.   Tobacco use: The patient denies current or previous tobacco use. Alcohol use: social drinker No drug use.  Exercise: moderately active  She does get adequate calcium and sometimes Vitamin D supp in her diet.   Past Medical History:  Diagnosis Date  . Anxiety   . BRCA negative 2017   MyRisk neg  . Breast cancer ([place]) 12/2015   ER/PR +, Her2 neu -; neg lymph nodes; s/p lumpectomy, radiation, attempted tamoxifen with side effects  . Cervical dysplasia   . Gastrointestinal disorder   . GERD (gastroesophageal reflux disease)   . IBS (irritable bowel  syndrome)   . Ovarian cyst     Past Surgical History:  Procedure Laterality Date  . arm surgery     left  . BREAST LUMPECTOMY WITH RADIOACTIVE SEED AND SENTINEL LYMPH NODE BIOPSY Right 03/01/2016   Procedure: RIGHT BREAST LUMPECTOMY WITH RADIOACTIVE SEED AND SENTINEL LYMPH NODE BIOPSY;  Surgeon: Stark Klein, MD;  Location: [facility];  Service: General;  Laterality: Right;  . CRYOTHERAPY    . EXCISION MASS LOWER EXTREMETIES Right 03/01/2016   Procedure: EXCISION MASS RIGHT THIGH;  Surgeon: Stark Klein, MD;  Location: [facility];  Service: General;  Laterality: Right;  . MOUTH SURGERY     "gum surgery" x 2  . TONSILLECTOMY    . WISDOM TOOTH EXTRACTION      Family History  Problem Relation Age of Onset  . Hyperlipidemia Father   . Prostate cancer Father   . Hypertension Father   . Hypertension Mother   . Hyperlipidemia Mother   . Bone cancer Paternal Grandfather   . Colon cancer Other        PGGF    Social History   Socioeconomic History  . Marital status: Single    Spouse name: Not on file  . Number of children: 0  . Years of education: Not on file  . Highest education level: Not on file  Occupational History  . Occupation: Secondary school teacher  Tobacco Use  . Smoking status: Never  Smoker  . Smokeless tobacco: Current User    Types: Snuff  Substance and Sexual Activity  . Alcohol use: Yes    Comment: 0-1 drinks daily  . Drug use: No  . Sexual activity: Yes    Birth control/protection: None  Other Topics Concern  . Not on file  Social History Narrative  . Not on file   Social Determinants of Health   Financial Resource Strain:   . Difficulty of Paying Living Expenses: Not on file  Food Insecurity:   . Worried About Running Out of Food in the Last Year: Not on file  . Ran Out of Food in the Last Year: Not on file  Transportation Needs:   . Lack of Transportation (Medical): Not on file  . Lack of Transportation (Non-Medical): Not  on file  Physical Activity:   . Days of Exercise per Week: Not on file  . Minutes of Exercise per Session: Not on file  Stress:   . Feeling of Stress : Not on file  Social Connections:   . Frequency of Communication with Friends and Family: Not on file  . Frequency of Social Gatherings with Friends and Family: Not on file  . Attends Religious Services: Not on file  . Active Member of Clubs or Organizations: Not on file  . Attends Club or Organization Meetings: Not on file  . Marital Status: Not on file  Intimate Partner Violence:   . Fear of Current or Ex-Partner: Not on file  . Emotionally Abused: Not on file  . Physically Abused: Not on file  . Sexually Abused: Not on file    Outpatient Medications Prior to Visit  Medication Sig Dispense Refill  . cetirizine HCl (ZYRTEC) 5 MG/5ML SOLN Take 5 mLs (5 mg total) by mouth daily. 300 mL   . cholecalciferol (VITAMIN D) 25 MCG (1000 UT) tablet Take 1 tablet (1,000 Units total) by mouth daily.    . omeprazole (PRILOSEC) 40 MG capsule TAKE 1 CAPSULE BY MOUTH EVERY DAY 30 capsule 2  . triamcinolone (NASACORT) 55 MCG/ACT AERO nasal inhaler Place 2 sprays into the nose as needed.     . famotidine (PEPCID) 20 MG tablet Take 1 tablet (20 mg total) by mouth 2 (two) times daily.     Facility-Administered Medications Prior to Visit  Medication Dose Route Frequency Provider Last Rate Last Admin  . 0.9 %  sodium chloride infusion  500 mL Intravenous Once Pyrtle, Jay M, MD        ROS:  Review of Systems  Constitutional: Negative for fatigue, fever and unexpected weight change.  Respiratory: Negative for cough, shortness of breath and wheezing.   Cardiovascular: Negative for chest pain, palpitations and leg swelling.  Gastrointestinal: Negative for blood in stool, constipation, diarrhea, nausea and vomiting.  Endocrine: Negative for cold intolerance, heat intolerance and polyuria.  Genitourinary: Positive for vaginal bleeding. Negative for  dyspareunia, dysuria, flank pain, frequency, genital sores, hematuria, menstrual problem, pelvic pain, urgency, vaginal discharge and vaginal pain.  Musculoskeletal: Negative for back pain, joint swelling and myalgias.  Skin: Negative for rash.  Neurological: Negative for dizziness, syncope, light-headedness, numbness and headaches.  Hematological: Negative for adenopathy.  Psychiatric/Behavioral: Negative for agitation, confusion, sleep disturbance and suicidal ideas. The patient is not nervous/anxious.   BREAST: tenderness (where she had radiation tx)   Objective: BP 100/70   Ht 5' 2" (1.575 m)   Wt 132 lb (59.9 kg)   LMP 11/02/2019 (Exact Date)   BMI 24.14 kg/m      Physical Exam Constitutional:      Appearance: She is well-developed.  Genitourinary:     Vulva, vagina, uterus, right adnexa and left adnexa normal.     No vulval lesion or tenderness noted.     No vaginal discharge, erythema or tenderness.     No cervical motion tenderness or polyp.     Uterus is not enlarged or tender.     No right or left adnexal mass present.     Right adnexa not tender.     Left adnexa not tender.  Neck:     Thyroid: No thyromegaly.  Cardiovascular:     Rate and Rhythm: Normal rate and regular rhythm.     Heart sounds: Normal heart sounds. No murmur.  Pulmonary:     Effort: Pulmonary effort is normal.     Breath sounds: Normal breath sounds.  Chest:     Breasts:        Right: No mass, nipple discharge, skin change or tenderness.        Left: No mass, nipple discharge, skin change or tenderness.  Abdominal:     Palpations: Abdomen is soft.     Tenderness: There is no abdominal tenderness. There is no guarding.  Musculoskeletal:        General: Normal range of motion.     Cervical back: Normal range of motion.  Neurological:     General: No focal deficit present.     Mental Status: She is alert and oriented to person, place, and time.     Cranial Nerves: No cranial nerve deficit.   Skin:    General: Skin is warm and dry.  Psychiatric:        Mood and Affect: Mood normal.        Behavior: Behavior normal.        Thought Content: Thought content normal.        Judgment: Judgment normal.  Vitals reviewed.     Assessment/Plan: Encounter for annual routine gynecological examination  Cervical cancer screening - Plan: Cytology - PAP  Screening for HPV (human papillomavirus) - Plan: Cytology - PAP  Abnormal uterine bleeding (AUB)--1 episode. Check pap today. Reassurance. Will check u/s if sx recur. F/u prn.   Malignant neoplasm of upper-outer quadrant of right breast in female, estrogen receptor positive (HCC)--followed by onc. Current on mammo.        GYN counsel breast self exam,, adequate intake of calcium and vitamin D, diet and exercise     F/U  Return in about 1 year (around 11/19/2020).  Alicia B. Copland, PA-C 11/20/2019 4:06 PM 

## 2019-11-20 NOTE — Patient Instructions (Signed)
I value your feedback and entrusting us with your care. If you get a [facility] patient survey, I would appreciate you taking the time to let us know about your experience today. Thank you!  As of August 28, 2019, your lab results will be released to your MyChart immediately, before I even have a chance to see them. Please give me time to review them and contact you if there are any abnormalities. Thank you for your patience.  

## 2019-11-24 LAB — CYTOLOGY - PAP
Comment: NEGATIVE
Diagnosis: NEGATIVE
High risk HPV: NEGATIVE

## 2019-12-30 IMAGING — CT CT ABD-PELV W/ CM
2 of 5 series · 16 of 46 positions shown, 18 images · IV contrast (APPLIED)
Comparison: None.

CLINICAL DATA: Abdominal pain and nausea for 5 days.

EXAM:
CT ABDOMEN AND PELVIS WITH CONTRAST
TECHNIQUE: Multidetector CT imaging of the abdomen and pelvis was performed
using the standard protocol following bolus administration of
intravenous contrast.
CONTRAST:  100 mL OYLEEY-F55 IOPAMIDOL (OYLEEY-F55) INJECTION 61%

[Series 2: axial st · axial · 0.71mm/px · z∈[-436,-36]mm · 13 of 90 slices shown, 15 images]
[im 5/90  soft-tissue]
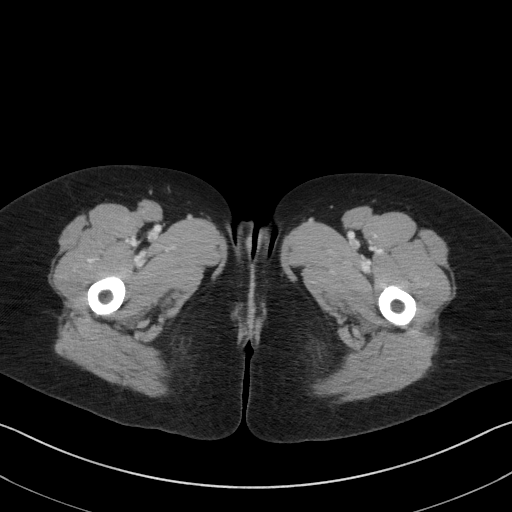
[im 5/90  bone]
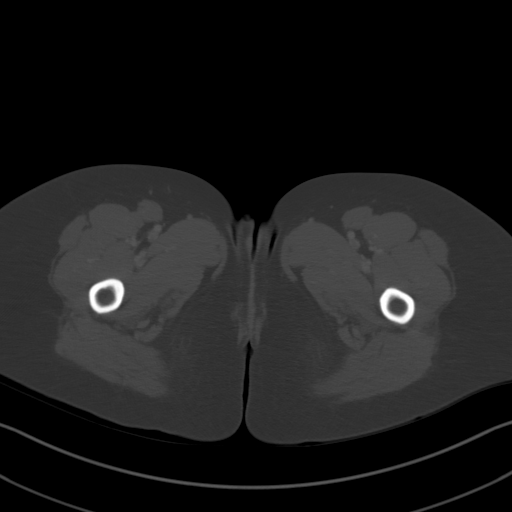
[im 15/90  soft-tissue]
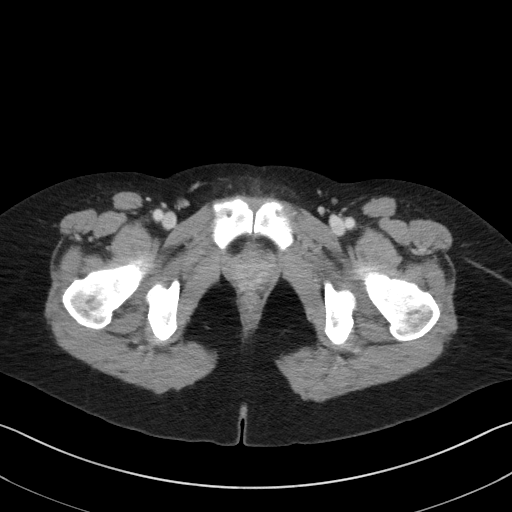
[im 19/90  soft-tissue]
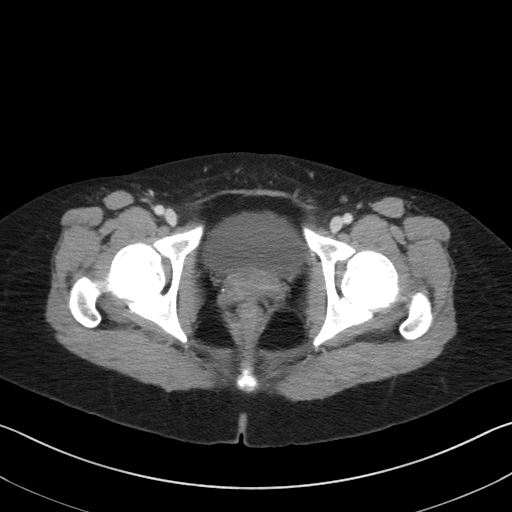
[im 24/90  soft-tissue]
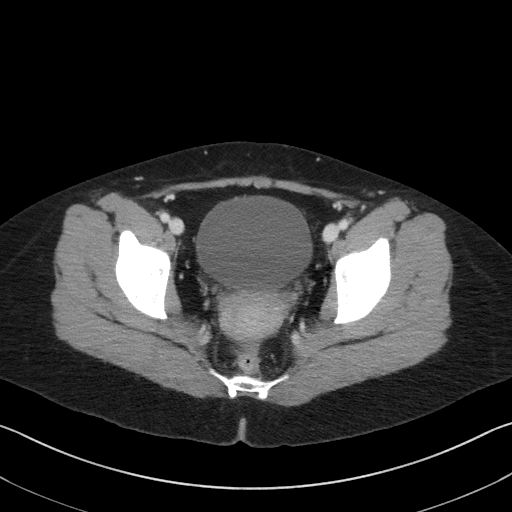
[im 33/90  soft-tissue]
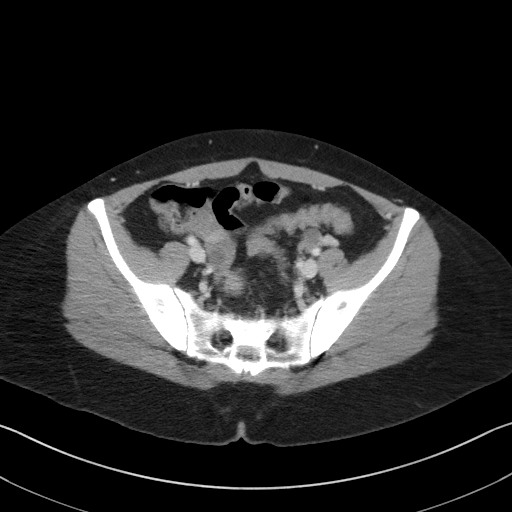
[im 38/90  soft-tissue]
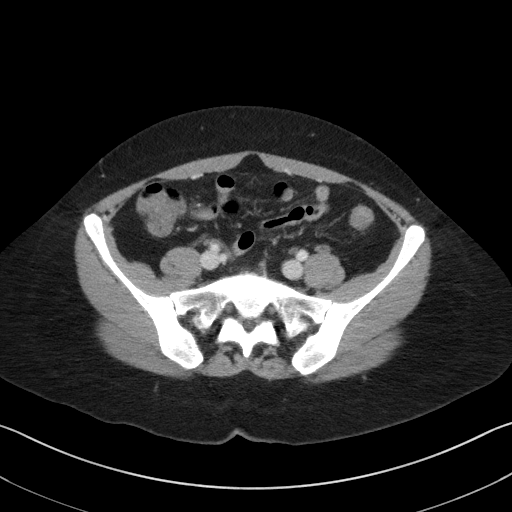
[im 47/90  soft-tissue]
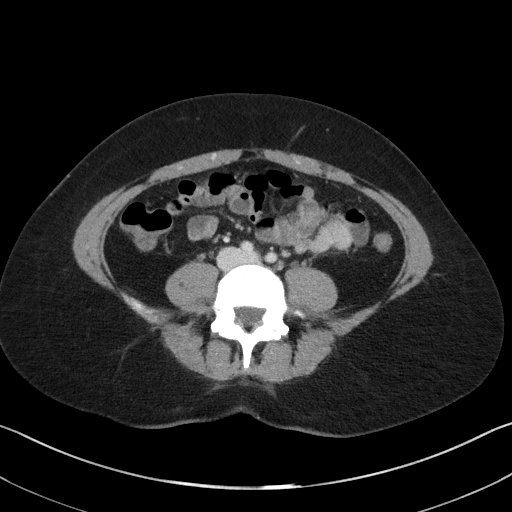
[im 52/90  soft-tissue]
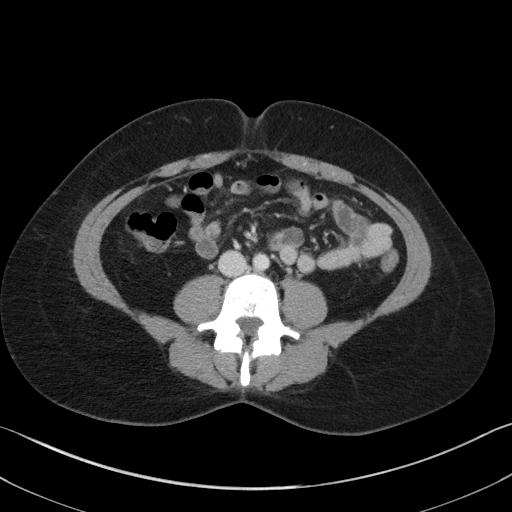
[im 57/90  soft-tissue]
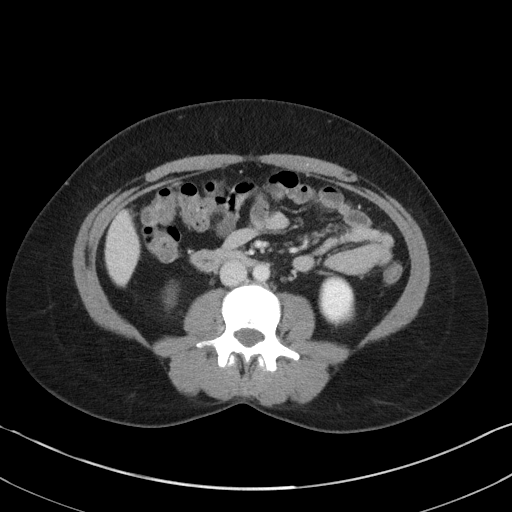
[im 57/90  bone]
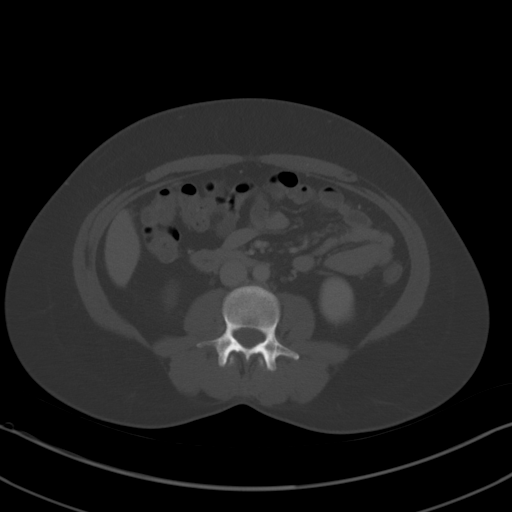
[im 66/90  soft-tissue]
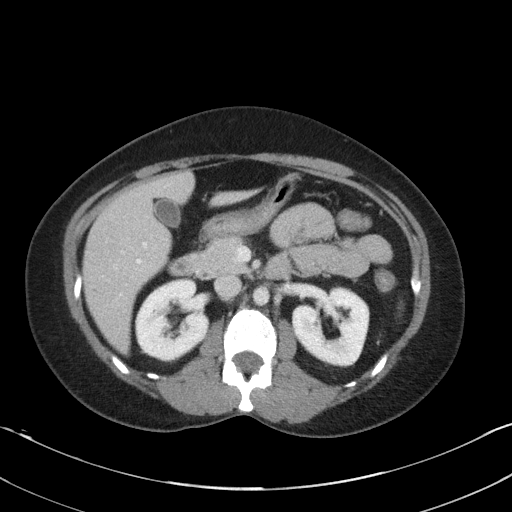
[im 71/90  soft-tissue]
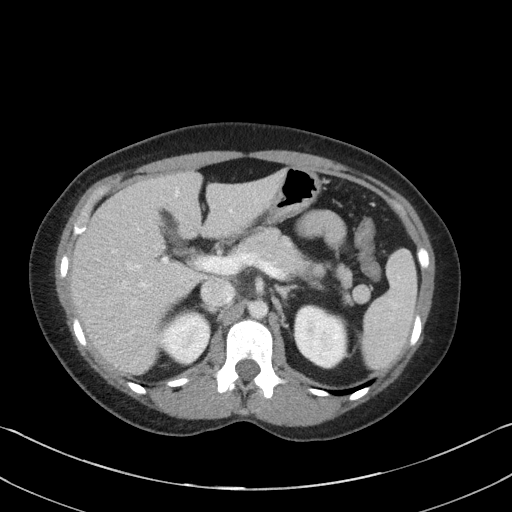
[im 75/90  soft-tissue]
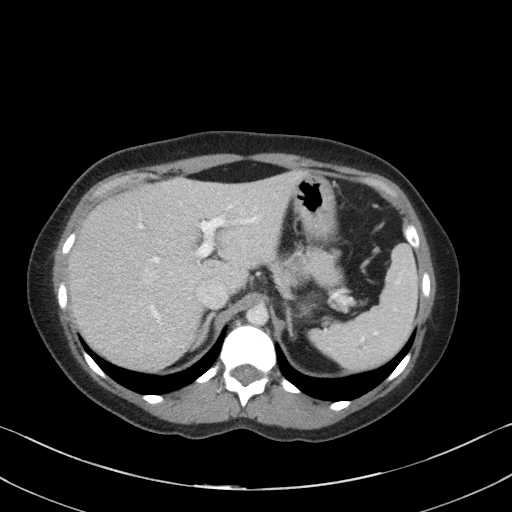
[im 85/90  soft-tissue]
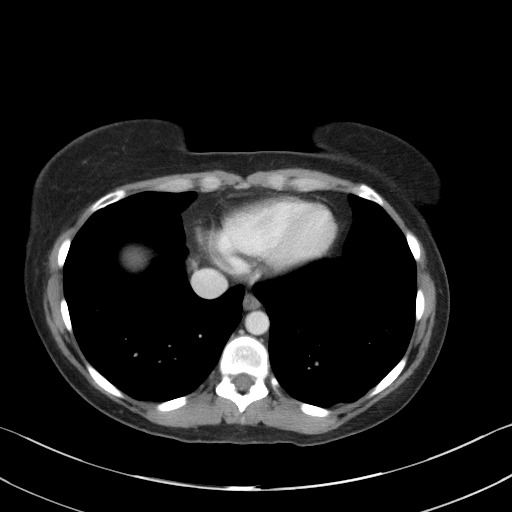

[Series 5: coronal st · coronal · 0.78mm/px · 3 of 77 slices shown]
[im 26/77  soft-tissue]
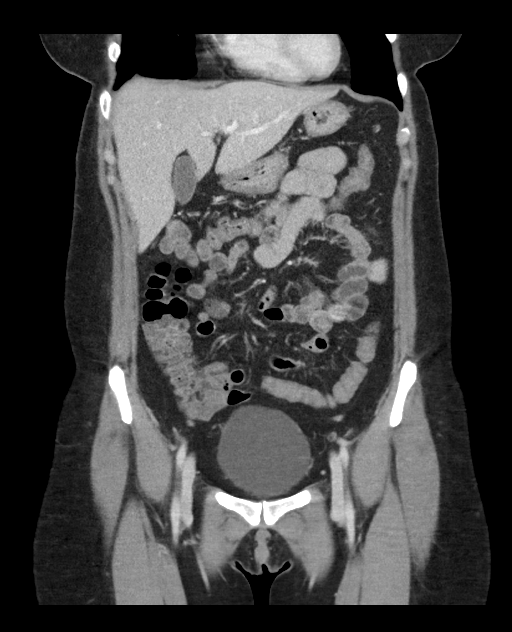
[im 34/77  soft-tissue]
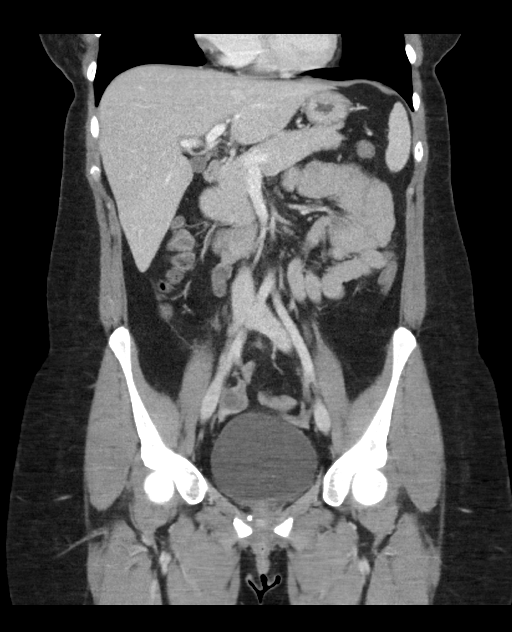
[im 43/77  soft-tissue]
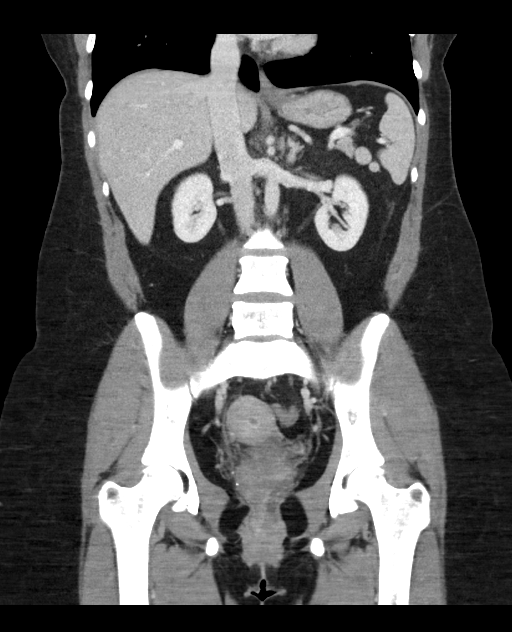

[16 of 46 positions shown; findings below may reference images not displayed]

FINDINGS: Lower chest: The lung bases are clear. No pleural or pericardial
effusion.

Hepatobiliary: No focal liver abnormality is seen. No gallstones,
gallbladder wall thickening, or biliary dilatation.

Pancreas: Unremarkable. No pancreatic ductal dilatation or
surrounding inflammatory changes.

Spleen: Normal in size without focal abnormality.

Adrenals/Urinary Tract: Adrenal glands are unremarkable. Kidneys are
normal, without renal calculi, focal lesion, or hydronephrosis.
Bladder is unremarkable.

Stomach/Bowel: Stomach is within normal limits. Appendix appears
normal. No evidence of bowel wall thickening, distention, or
inflammatory changes.

Vascular/Lymphatic: No significant vascular findings are present. No
enlarged abdominal or pelvic lymph nodes.

Reproductive: Uterus and bilateral adnexa are unremarkable.

Other: None.

Musculoskeletal: Negative.
IMPRESSION: Negative CT abdomen and pelvis. No finding to explain the patient's
symptoms.

## 2020-12-22 DIAGNOSIS — M722 Plantar fascial fibromatosis: Secondary | ICD-10-CM | POA: Diagnosis not present

## 2020-12-22 DIAGNOSIS — M9901 Segmental and somatic dysfunction of cervical region: Secondary | ICD-10-CM | POA: Diagnosis not present

## 2020-12-22 DIAGNOSIS — M9903 Segmental and somatic dysfunction of lumbar region: Secondary | ICD-10-CM | POA: Diagnosis not present

## 2020-12-22 DIAGNOSIS — M9902 Segmental and somatic dysfunction of thoracic region: Secondary | ICD-10-CM | POA: Diagnosis not present

## 2021-01-05 DIAGNOSIS — M9901 Segmental and somatic dysfunction of cervical region: Secondary | ICD-10-CM | POA: Diagnosis not present

## 2021-01-05 DIAGNOSIS — M9903 Segmental and somatic dysfunction of lumbar region: Secondary | ICD-10-CM | POA: Diagnosis not present

## 2021-01-05 DIAGNOSIS — M9902 Segmental and somatic dysfunction of thoracic region: Secondary | ICD-10-CM | POA: Diagnosis not present

## 2021-01-05 DIAGNOSIS — M722 Plantar fascial fibromatosis: Secondary | ICD-10-CM | POA: Diagnosis not present

## 2021-01-07 DIAGNOSIS — S6981XA Other specified injuries of right wrist, hand and finger(s), initial encounter: Secondary | ICD-10-CM | POA: Diagnosis not present

## 2021-01-12 ENCOUNTER — Encounter: Payer: Self-pay | Admitting: Obstetrics and Gynecology

## 2021-01-12 ENCOUNTER — Ambulatory Visit (INDEPENDENT_AMBULATORY_CARE_PROVIDER_SITE_OTHER): Payer: BC Managed Care – PPO | Admitting: Obstetrics and Gynecology

## 2021-01-12 ENCOUNTER — Other Ambulatory Visit: Payer: Self-pay

## 2021-01-12 VITALS — BP 100/80 | Ht 62.0 in | Wt 138.0 lb

## 2021-01-12 DIAGNOSIS — Z1211 Encounter for screening for malignant neoplasm of colon: Secondary | ICD-10-CM | POA: Diagnosis not present

## 2021-01-12 DIAGNOSIS — Z01419 Encounter for gynecological examination (general) (routine) without abnormal findings: Secondary | ICD-10-CM | POA: Diagnosis not present

## 2021-01-12 DIAGNOSIS — Z170 Estrogen receptor positive status [ER+]: Secondary | ICD-10-CM

## 2021-01-12 DIAGNOSIS — C50411 Malignant neoplasm of upper-outer quadrant of right female breast: Secondary | ICD-10-CM | POA: Diagnosis not present

## 2021-01-12 NOTE — Patient Instructions (Signed)
I value your feedback and you entrusting us with your care. If you get a [facility] patient survey, I would appreciate you taking the time to let us know about your experience today. Thank you! ? ? ?

## 2021-01-12 NOTE — Progress Notes (Signed)
PCP:  Kathyrn Lass, MD   Chief Complaint  Patient presents with  . Gynecologic Exam    No concerns     HPI:      [name] is a 45 y.o. No obstetric history on file. who LMP was Patient's last menstrual period was 12/27/2020 (approximate)., presents today for her annual examination.  Her menses are regular every 28-30 days, lasting 5 days.  Dysmenorrhea mod, worse at night, occurring first 1-2 days of flow. NSAIDs/heating pad mildly helpful. She does not have intermenstrual bleeding.   Sex activity: rarely sex active--contraception - natural family planning. Trying to be careful. Can't have estrogen due to breast cancer, ER/PR +, Her2 neu -. Pt had failed IUD insertion in past due to cx "wouldn't open up". S/p cryotherapy. Not interested in [facility]. 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 Has appt 5/22.  S/P RT breast  Intraductal carcinoma with lumpectomy, radiation, and tamoxifen tx but couldn't tolerate tamoxifen. Not on any meds for tx. Pt is MyRisk neg 2017.  There is no FH of breast cancer. There is no FH of ovarian cancer. The patient does self-breast exams. Pt had neg MyRisk testing 2017. There is FH prostate cancer in her dad and pat uncle.  Tobacco use: The patient denies current or previous tobacco use. Alcohol use: social drinker No drug use.  Exercise: moderately active  Colonoscopy: 09/2012 with Dr. Hilarie Fredrickson in [place] due to GI issues. No FH colon cancer  She does get adequate calcium and sometimes Vitamin D supp in her diet.   Past Medical History:  Diagnosis Date  . Anxiety   . BRCA negative 2017   MyRisk neg  . Breast cancer ([place]) 12/2015   ER/PR +, Her2 neu -; neg lymph nodes; s/p lumpectomy, radiation, attempted tamoxifen with side effects  . Cervical dysplasia   . Gastrointestinal disorder   .  GERD (gastroesophageal reflux disease)   . IBS (irritable bowel syndrome)   . Ovarian cyst     Past Surgical History:  Procedure Laterality Date  . arm surgery     left  . BREAST LUMPECTOMY WITH RADIOACTIVE SEED AND SENTINEL LYMPH NODE BIOPSY Right 03/01/2016   Procedure: RIGHT BREAST LUMPECTOMY WITH RADIOACTIVE SEED AND SENTINEL LYMPH NODE BIOPSY;  Surgeon: Stark Klein, MD;  Location: [place];  Service: General;  Laterality: Right;  . CRYOTHERAPY    . EXCISION MASS LOWER EXTREMETIES Right 03/01/2016   Procedure: EXCISION MASS RIGHT THIGH;  Surgeon: Stark Klein, MD;  Location: [place];  Service: General;  Laterality: Right;  . MOUTH SURGERY     "gum surgery" x 2  . TONSILLECTOMY    . WISDOM TOOTH EXTRACTION      Family History  Problem Relation Age of Onset  . Hyperlipidemia Father   . Prostate cancer Father   . Hypertension Father   . Hypertension Mother   . Hyperlipidemia Mother   . Bone cancer Paternal Grandfather   . Colon cancer Other        PGGF  . Prostate cancer Paternal Uncle     Social History   Socioeconomic History  . Marital status: Single    Spouse name: Not on file  . Number of children: 0  . Years of education: Not on file  . Highest education level:  Not on file  Occupational History  . Occupation: Secondary school teacher  Tobacco Use  . Smoking status: Never Smoker  . Smokeless tobacco: Current User    Types: Snuff  Vaping Use  . Vaping Use: Never used  Substance and Sexual Activity  . Alcohol use: Yes    Comment: 0-1 drinks daily  . Drug use: No  . Sexual activity: Yes    Birth control/protection: None  Other Topics Concern  . Not on file  Social History Narrative  . Not on file   Social Determinants of Health   Financial Resource Strain: Not on file  Food Insecurity: Not on file  Transportation Needs: Not on file  Physical Activity: Not on file  Stress: Not on file  Social Connections: Not on file   Intimate Partner Violence: Not on file    Outpatient Medications Prior to Visit  Medication Sig Dispense Refill  . cetirizine HCl (ZYRTEC) 5 MG/5ML SOLN Take 5 mLs (5 mg total) by mouth daily. 300 mL   . cholecalciferol (VITAMIN D) 25 MCG (1000 UT) tablet Take 1 tablet (1,000 Units total) by mouth daily.    . fluticasone (FLONASE) 50 MCG/ACT nasal spray 2 sprays by Each Nare route daily.    Marland Kitchen loratadine (ALAVERT) 10 MG dissolvable tablet 1 tablet on the tongue and allow to dissolve    . omeprazole (PRILOSEC) 40 MG capsule TAKE 1 CAPSULE BY MOUTH EVERY DAY 30 capsule 2  . triamcinolone (NASACORT) 55 MCG/ACT AERO nasal inhaler Place 2 sprays into the nose as needed.      Facility-Administered Medications Prior to Visit  Medication Dose Route Frequency Provider Last Rate Last Admin  . 0.9 %  sodium chloride infusion  500 mL Intravenous Once Pyrtle, Lajuan Lines, MD        ROS:  Review of Systems  Constitutional: Negative for fatigue, fever and unexpected weight change.  Respiratory: Negative for cough, shortness of breath and wheezing.   Cardiovascular: Negative for chest pain, palpitations and leg swelling.  Gastrointestinal: Negative for blood in stool, constipation, diarrhea, nausea and vomiting.  Endocrine: Negative for cold intolerance, heat intolerance and polyuria.  Genitourinary: Negative for dyspareunia, dysuria, flank pain, frequency, genital sores, hematuria, menstrual problem, pelvic pain, urgency, vaginal bleeding, vaginal discharge and vaginal pain.  Musculoskeletal: Negative for back pain, joint swelling and myalgias.  Skin: Negative for rash.  Neurological: Negative for dizziness, syncope, light-headedness, numbness and headaches.  Hematological: Negative for adenopathy.  Psychiatric/Behavioral: Negative for agitation, confusion, sleep disturbance and suicidal ideas. The patient is not nervous/anxious.   BREAST: tenderness (where she had radiation tx)   Objective: BP 100/80    Ht '5\' 2"'  (1.575 m)   Wt 138 lb (62.6 kg)   LMP 12/27/2020 (Approximate)   BMI 25.24 kg/m    Physical Exam Constitutional:      Appearance: She is well-developed.  Genitourinary:     Vulva normal.     Right Labia: No rash, tenderness or lesions.    Left Labia: No tenderness, lesions or rash.    No vaginal discharge, erythema or tenderness.      Right Adnexa: not tender and no mass present.    Left Adnexa: not tender and no mass present.    No cervical motion tenderness, friability or polyp.     Uterus is not enlarged or tender.  Breasts:     Right: No mass, nipple discharge, skin change or tenderness.     Left: No mass, nipple discharge, skin change  or tenderness.    Neck:     Thyroid: No thyromegaly.  Cardiovascular:     Rate and Rhythm: Normal rate and regular rhythm.     Heart sounds: Normal heart sounds. No murmur heard.   Pulmonary:     Effort: Pulmonary effort is normal.     Breath sounds: Normal breath sounds.  Abdominal:     Palpations: Abdomen is soft.     Tenderness: There is no abdominal tenderness. There is no guarding or rebound.  Musculoskeletal:        General: Normal range of motion.     Cervical back: Normal range of motion.  Lymphadenopathy:     Cervical: No cervical adenopathy.  Neurological:     General: No focal deficit present.     Mental Status: She is alert and oriented to person, place, and time.     Cranial Nerves: No cranial nerve deficit.  Skin:    General: Skin is warm and dry.  Psychiatric:        Mood and Affect: Mood normal.        Behavior: Behavior normal.        Thought Content: Thought content normal.        Judgment: Judgment normal.  Vitals reviewed.     Assessment/Plan: Encounter for annual routine gynecological examination  Malignant neoplasm of upper-outer quadrant of right breast in female, estrogen receptor positive ([place]); has mammo sched 5/22. Doing well.   Screening for colon cancer - Plan: Ambulatory  referral to Gastroenterology; refer to GI. Not sure if due age 38 or 2023 since had colonoscopy 2014 for diarrhea. Will let GI make recommendation.          GYN counsel breast self exam,, adequate intake of calcium and vitamin D, diet and exercise     F/U  Return in about 1 year (around 01/12/2022).  Grey Rakestraw B. Eliyohu Class, PA-C 01/12/2021 2:52 PM

## 2021-01-20 DIAGNOSIS — M9902 Segmental and somatic dysfunction of thoracic region: Secondary | ICD-10-CM | POA: Diagnosis not present

## 2021-01-20 DIAGNOSIS — M722 Plantar fascial fibromatosis: Secondary | ICD-10-CM | POA: Diagnosis not present

## 2021-01-20 DIAGNOSIS — M9901 Segmental and somatic dysfunction of cervical region: Secondary | ICD-10-CM | POA: Diagnosis not present

## 2021-01-20 DIAGNOSIS — M9903 Segmental and somatic dysfunction of lumbar region: Secondary | ICD-10-CM | POA: Diagnosis not present

## 2021-01-25 DIAGNOSIS — Z853 Personal history of malignant neoplasm of breast: Secondary | ICD-10-CM | POA: Diagnosis not present

## 2021-02-04 ENCOUNTER — Encounter: Payer: Self-pay | Admitting: Hematology and Oncology

## 2021-02-04 DIAGNOSIS — M722 Plantar fascial fibromatosis: Secondary | ICD-10-CM | POA: Diagnosis not present

## 2021-02-04 DIAGNOSIS — M9903 Segmental and somatic dysfunction of lumbar region: Secondary | ICD-10-CM | POA: Diagnosis not present

## 2021-02-04 DIAGNOSIS — M9901 Segmental and somatic dysfunction of cervical region: Secondary | ICD-10-CM | POA: Diagnosis not present

## 2021-02-04 DIAGNOSIS — M9902 Segmental and somatic dysfunction of thoracic region: Secondary | ICD-10-CM | POA: Diagnosis not present

## 2021-02-07 DIAGNOSIS — S30860A Insect bite (nonvenomous) of lower back and pelvis, initial encounter: Secondary | ICD-10-CM | POA: Diagnosis not present

## 2021-02-18 DIAGNOSIS — M9901 Segmental and somatic dysfunction of cervical region: Secondary | ICD-10-CM | POA: Diagnosis not present

## 2021-02-18 DIAGNOSIS — M9902 Segmental and somatic dysfunction of thoracic region: Secondary | ICD-10-CM | POA: Diagnosis not present

## 2021-02-18 DIAGNOSIS — M722 Plantar fascial fibromatosis: Secondary | ICD-10-CM | POA: Diagnosis not present

## 2021-02-18 DIAGNOSIS — M9903 Segmental and somatic dysfunction of lumbar region: Secondary | ICD-10-CM | POA: Diagnosis not present

## 2021-03-08 DIAGNOSIS — M9901 Segmental and somatic dysfunction of cervical region: Secondary | ICD-10-CM | POA: Diagnosis not present

## 2021-03-08 DIAGNOSIS — M722 Plantar fascial fibromatosis: Secondary | ICD-10-CM | POA: Diagnosis not present

## 2021-03-08 DIAGNOSIS — M9902 Segmental and somatic dysfunction of thoracic region: Secondary | ICD-10-CM | POA: Diagnosis not present

## 2021-03-08 DIAGNOSIS — M9903 Segmental and somatic dysfunction of lumbar region: Secondary | ICD-10-CM | POA: Diagnosis not present

## 2021-03-16 DIAGNOSIS — R21 Rash and other nonspecific skin eruption: Secondary | ICD-10-CM | POA: Diagnosis not present

## 2021-03-16 DIAGNOSIS — R5383 Other fatigue: Secondary | ICD-10-CM | POA: Diagnosis not present

## 2021-03-29 DIAGNOSIS — M722 Plantar fascial fibromatosis: Secondary | ICD-10-CM | POA: Diagnosis not present

## 2021-03-29 DIAGNOSIS — M9901 Segmental and somatic dysfunction of cervical region: Secondary | ICD-10-CM | POA: Diagnosis not present

## 2021-03-29 DIAGNOSIS — M9903 Segmental and somatic dysfunction of lumbar region: Secondary | ICD-10-CM | POA: Diagnosis not present

## 2021-03-29 DIAGNOSIS — M9902 Segmental and somatic dysfunction of thoracic region: Secondary | ICD-10-CM | POA: Diagnosis not present

## 2021-04-06 DIAGNOSIS — R509 Fever, unspecified: Secondary | ICD-10-CM | POA: Diagnosis not present

## 2021-04-06 DIAGNOSIS — U071 COVID-19: Secondary | ICD-10-CM | POA: Diagnosis not present

## 2021-04-14 DIAGNOSIS — L309 Dermatitis, unspecified: Secondary | ICD-10-CM | POA: Diagnosis not present

## 2021-04-21 DIAGNOSIS — H04123 Dry eye syndrome of bilateral lacrimal glands: Secondary | ICD-10-CM | POA: Diagnosis not present

## 2021-04-25 ENCOUNTER — Telehealth: Payer: Self-pay | Admitting: Internal Medicine

## 2021-04-25 NOTE — Telephone Encounter (Signed)
Received a referral for colonoscopy. Patient had last colonoscopy in 2014. She states her oncologist wants her to have one soon due to her having breast cancer. Could you verify when next one is needed?

## 2021-04-26 NOTE — Telephone Encounter (Signed)
See note below from Dr. Pyrtle 

## 2021-04-26 NOTE — Telephone Encounter (Signed)
Dr. Hilarie Fredrickson please see note below and advise if ok to go ahead and schedule pt for direct colon.

## 2021-04-26 NOTE — Telephone Encounter (Signed)
She has reached screening age for colonoscopy, her prior colon was not for screening Okay for screening colonoscopy

## 2021-04-27 NOTE — Telephone Encounter (Signed)
Left vm to return call.    

## 2021-04-28 DIAGNOSIS — M9903 Segmental and somatic dysfunction of lumbar region: Secondary | ICD-10-CM | POA: Diagnosis not present

## 2021-04-28 DIAGNOSIS — M9902 Segmental and somatic dysfunction of thoracic region: Secondary | ICD-10-CM | POA: Diagnosis not present

## 2021-04-28 DIAGNOSIS — M9901 Segmental and somatic dysfunction of cervical region: Secondary | ICD-10-CM | POA: Diagnosis not present

## 2021-04-28 DIAGNOSIS — M722 Plantar fascial fibromatosis: Secondary | ICD-10-CM | POA: Diagnosis not present

## 2021-05-05 DIAGNOSIS — H04123 Dry eye syndrome of bilateral lacrimal glands: Secondary | ICD-10-CM | POA: Diagnosis not present

## 2021-05-17 DIAGNOSIS — M722 Plantar fascial fibromatosis: Secondary | ICD-10-CM | POA: Diagnosis not present

## 2021-05-17 DIAGNOSIS — M25672 Stiffness of left ankle, not elsewhere classified: Secondary | ICD-10-CM | POA: Diagnosis not present

## 2021-05-17 DIAGNOSIS — M9902 Segmental and somatic dysfunction of thoracic region: Secondary | ICD-10-CM | POA: Diagnosis not present

## 2021-05-17 DIAGNOSIS — M25552 Pain in left hip: Secondary | ICD-10-CM | POA: Diagnosis not present

## 2021-05-17 DIAGNOSIS — M9903 Segmental and somatic dysfunction of lumbar region: Secondary | ICD-10-CM | POA: Diagnosis not present

## 2021-05-17 DIAGNOSIS — M9906 Segmental and somatic dysfunction of lower extremity: Secondary | ICD-10-CM | POA: Diagnosis not present

## 2021-05-17 DIAGNOSIS — M9901 Segmental and somatic dysfunction of cervical region: Secondary | ICD-10-CM | POA: Diagnosis not present

## 2021-05-31 DIAGNOSIS — M9901 Segmental and somatic dysfunction of cervical region: Secondary | ICD-10-CM | POA: Diagnosis not present

## 2021-05-31 DIAGNOSIS — M7918 Myalgia, other site: Secondary | ICD-10-CM | POA: Diagnosis not present

## 2021-05-31 DIAGNOSIS — M9905 Segmental and somatic dysfunction of pelvic region: Secondary | ICD-10-CM | POA: Diagnosis not present

## 2021-05-31 DIAGNOSIS — M9902 Segmental and somatic dysfunction of thoracic region: Secondary | ICD-10-CM | POA: Diagnosis not present

## 2021-05-31 DIAGNOSIS — M9906 Segmental and somatic dysfunction of lower extremity: Secondary | ICD-10-CM | POA: Diagnosis not present

## 2021-05-31 DIAGNOSIS — M722 Plantar fascial fibromatosis: Secondary | ICD-10-CM | POA: Diagnosis not present

## 2021-05-31 DIAGNOSIS — M9903 Segmental and somatic dysfunction of lumbar region: Secondary | ICD-10-CM | POA: Diagnosis not present

## 2021-05-31 DIAGNOSIS — M25672 Stiffness of left ankle, not elsewhere classified: Secondary | ICD-10-CM | POA: Diagnosis not present

## 2021-06-08 DIAGNOSIS — R1011 Right upper quadrant pain: Secondary | ICD-10-CM | POA: Diagnosis not present

## 2021-06-08 DIAGNOSIS — F419 Anxiety disorder, unspecified: Secondary | ICD-10-CM | POA: Diagnosis not present

## 2021-06-08 DIAGNOSIS — R002 Palpitations: Secondary | ICD-10-CM | POA: Diagnosis not present

## 2021-06-08 LAB — TSH: TSH: 0.92 (ref 0.41–5.90)

## 2021-06-17 DIAGNOSIS — R1011 Right upper quadrant pain: Secondary | ICD-10-CM | POA: Diagnosis not present

## 2021-06-21 DIAGNOSIS — M79672 Pain in left foot: Secondary | ICD-10-CM | POA: Diagnosis not present

## 2021-06-21 DIAGNOSIS — M9902 Segmental and somatic dysfunction of thoracic region: Secondary | ICD-10-CM | POA: Diagnosis not present

## 2021-06-21 DIAGNOSIS — M25672 Stiffness of left ankle, not elsewhere classified: Secondary | ICD-10-CM | POA: Diagnosis not present

## 2021-06-21 DIAGNOSIS — M9905 Segmental and somatic dysfunction of pelvic region: Secondary | ICD-10-CM | POA: Diagnosis not present

## 2021-06-21 DIAGNOSIS — M9901 Segmental and somatic dysfunction of cervical region: Secondary | ICD-10-CM | POA: Diagnosis not present

## 2021-06-21 DIAGNOSIS — M7918 Myalgia, other site: Secondary | ICD-10-CM | POA: Diagnosis not present

## 2021-06-21 DIAGNOSIS — M9903 Segmental and somatic dysfunction of lumbar region: Secondary | ICD-10-CM | POA: Diagnosis not present

## 2021-06-21 DIAGNOSIS — M722 Plantar fascial fibromatosis: Secondary | ICD-10-CM | POA: Diagnosis not present

## 2021-06-21 DIAGNOSIS — M9906 Segmental and somatic dysfunction of lower extremity: Secondary | ICD-10-CM | POA: Diagnosis not present

## 2021-06-27 DIAGNOSIS — Z13220 Encounter for screening for lipoid disorders: Secondary | ICD-10-CM | POA: Diagnosis not present

## 2021-06-27 DIAGNOSIS — Z0000 Encounter for general adult medical examination without abnormal findings: Secondary | ICD-10-CM | POA: Diagnosis not present

## 2021-06-27 DIAGNOSIS — Z23 Encounter for immunization: Secondary | ICD-10-CM | POA: Diagnosis not present

## 2021-06-27 DIAGNOSIS — K219 Gastro-esophageal reflux disease without esophagitis: Secondary | ICD-10-CM | POA: Diagnosis not present

## 2021-06-27 LAB — LIPID PANEL
Cholesterol: 244 — AB (ref 0–200)
HDL: 58 (ref 35–70)
LDL Cholesterol: 157
Triglycerides: 159 (ref 40–160)

## 2021-06-27 LAB — COMPREHENSIVE METABOLIC PANEL: eGFR: 90

## 2021-07-05 ENCOUNTER — Encounter: Payer: Self-pay | Admitting: Physician Assistant

## 2021-07-05 ENCOUNTER — Ambulatory Visit: Payer: BC Managed Care – PPO | Admitting: Physician Assistant

## 2021-07-05 VITALS — BP 100/70 | HR 68 | Ht 62.0 in | Wt 139.1 lb

## 2021-07-05 DIAGNOSIS — H04123 Dry eye syndrome of bilateral lacrimal glands: Secondary | ICD-10-CM | POA: Diagnosis not present

## 2021-07-05 DIAGNOSIS — G8929 Other chronic pain: Secondary | ICD-10-CM | POA: Diagnosis not present

## 2021-07-05 DIAGNOSIS — R1013 Epigastric pain: Secondary | ICD-10-CM

## 2021-07-05 DIAGNOSIS — Z1211 Encounter for screening for malignant neoplasm of colon: Secondary | ICD-10-CM

## 2021-07-05 DIAGNOSIS — R1011 Right upper quadrant pain: Secondary | ICD-10-CM | POA: Diagnosis not present

## 2021-07-05 MED ORDER — NA SULFATE-K SULFATE-MG SULF 17.5-3.13-1.6 GM/177ML PO SOLN: 1.0000 | Freq: Once | ORAL | 0 refills | Status: AC

## 2021-07-05 MED ORDER — OMEPRAZOLE 20 MG PO CPDR: 20.0000 mg | DELAYED_RELEASE_CAPSULE | Freq: Every day | ORAL | 5 refills | Status: DC

## 2021-07-05 NOTE — Progress Notes (Addendum)
Chief Complaint: Right upper quadrant pain/chronic epigastric pain  HPI:    [name] is a 45 year old Caucasian female, known to Dr. Hilarie Fredrickson, with a past medical history of breast cancer, reflux and IBS, who was referred to me by Kathyrn Lass, MD for a complaint of right upper quadrant pain.    10/10/2012 colonoscopy was normal.  Repeat recommended at the age of 12    12/14/2017 EGD was normal.  Pathology was normal.    07/06/2018 CT of the abdomen pelvis with contrast was negative.    04/25/2021 received referral for a colonoscopy.  Dr. Hilarie Fredrickson agreed she needed a screening colonoscopy.    Today, patient presents to clinic and explains that she has started with a new pain in her right upper quadrant which has only occurred a couple of times but was so intense that "I thought I was dying".  Tells me that one occurred when she was at the beach on vacation and she had another episode recently which felt like a cramping/constant discomfort up underneath her ribs on the right side.  Apparently had a work-up with her PCP and had an ultrasound which she tells me was fine as well as labs which are normal.  Does describe a family history of gallbladder issues.  Tells me during these episodes she started taking Tums/Pepto which "maybe helped a little bit".  Associated symptoms include occasional nausea with episodes.    More bothersome for the patient is that she has continued with chronic epigastric pain which is constant and sometimes irritated by different things including sometimes alcohol.  Oftentimes she will preemptively take a Omeprazole 20 mg if she knows she is going out for a big meal or to drink anything.  This pain is bothersome all the time she often finds herself trying to lift her chest up in order so it is not putting more pressure on this area.  Her quality of life is suffering.    Denies fever, chills, weight loss, change in bowel habits, blood in her stool or symptoms that awaken her from  sleep.  Past Medical History:  Diagnosis Date   Anxiety    BRCA negative 2017   MyRisk neg   Breast cancer ([place]) 12/2015   ER/PR +, Her2 neu -; neg lymph nodes; s/p lumpectomy, radiation, attempted tamoxifen with side effects   Cervical dysplasia    Gastrointestinal disorder    GERD (gastroesophageal reflux disease)    IBS (irritable bowel syndrome)    Ovarian cyst     Past Surgical History:  Procedure Laterality Date   arm surgery     left   BREAST LUMPECTOMY WITH RADIOACTIVE SEED AND SENTINEL LYMPH NODE BIOPSY Right 03/01/2016   Procedure: RIGHT BREAST LUMPECTOMY WITH RADIOACTIVE SEED AND SENTINEL LYMPH NODE BIOPSY;  Surgeon: Stark Klein, MD;  Location: [place];  Service: General;  Laterality: Right;   CRYOTHERAPY     EXCISION MASS LOWER EXTREMETIES Right 03/01/2016   Procedure: EXCISION MASS RIGHT THIGH;  Surgeon: Stark Klein, MD;  Location: [place];  Service: General;  Laterality: Right;   MOUTH SURGERY     "gum surgery" x 2   TONSILLECTOMY     WISDOM TOOTH EXTRACTION      Current Outpatient Medications  Medication Sig Dispense Refill   cetirizine HCl (ZYRTEC) 5 MG/5ML SOLN Take 5 mLs (5 mg total) by mouth daily. 300 mL    cholecalciferol (VITAMIN D) 25 MCG (1000 UT) tablet Take 1 tablet (  1,000 Units total) by mouth daily.     fluticasone (FLONASE) 50 MCG/ACT nasal spray 2 sprays by Each Nare route daily.     loratadine (ALAVERT) 10 MG dissolvable tablet 1 tablet on the tongue and allow to dissolve     omeprazole (PRILOSEC) 40 MG capsule TAKE 1 CAPSULE BY MOUTH EVERY DAY 30 capsule 2   Current Facility-Administered Medications  Medication Dose Route Frequency Provider Last Rate Last Admin   0.9 %  sodium chloride infusion  500 mL Intravenous Once Pyrtle, Lajuan Lines, MD        Allergies as of 07/05/2021 - Review Complete 01/12/2021  Allergen Reaction Noted   Sunscreen spf30 [facility] [employer] pf spf15] Hives, Itching, and Rash 08/21/2016    Cephalexin Rash and Other (See Comments) 10/30/2016    Family History  Problem Relation Age of Onset   Hyperlipidemia Father    Prostate cancer Father    Hypertension Father    Hypertension Mother    Hyperlipidemia Mother    Bone cancer Paternal Grandfather    Colon cancer Other        PGGF   Prostate cancer Paternal Uncle     Social History   Socioeconomic History   Marital status: Single    Spouse name: Not on file   Number of children: 0   Years of education: Not on file   Highest education level: Not on file  Occupational History   Occupation: Secondary school teacher  Tobacco Use   Smoking status: Never   Smokeless tobacco: Current    Types: Snuff  Vaping Use   Vaping Use: Never used  Substance and Sexual Activity   Alcohol use: Yes    Comment: 0-1 drinks daily   Drug use: No   Sexual activity: Yes    Birth control/protection: None  Other Topics Concern   Not on file  Social History Narrative   Not on file   Social Determinants of Health   Financial Resource Strain: Not on file  Food Insecurity: Not on file  Transportation Needs: Not on file  Physical Activity: Not on file  Stress: Not on file  Social Connections: Not on file  Intimate Partner Violence: Not on file    Review of Systems:    Constitutional: No weight loss, fever or chills Skin: No rash  Cardiovascular: No chest pain   Respiratory: No SOB Gastrointestinal: See HPI and otherwise negative Genitourinary: No dysuria  Neurological: No headache, dizziness or syncope Musculoskeletal: No new muscle or joint pain Hematologic: No bleeding  Psychiatric: No history of depression or anxiety   Physical Exam:  Vital signs: BP 100/70 (BP Location: Left Arm, Patient Position: Sitting, Cuff Size: Large)   Pulse 68   Ht _0  (1.575 m) Comment: height measured without shoes  Wt 139 lb 2 oz (63.1 kg)   LMP 06/23/2021   BMI 25.45 kg/m    Constitutional:   Pleasant Caucasian female appears to be in  NAD, Well developed, Well nourished, alert and cooperative Head:  Normocephalic and atraumatic. Eyes:   PEERL, EOMI. No icterus. Conjunctiva pink. Ears:  Normal auditory acuity. Neck:  Supple Throat: Oral cavity and pharynx without inflammation, swelling or lesion.  Respiratory: Respirations even and unlabored. Lungs clear to auscultation bilaterally.   No wheezes, crackles, or rhonchi.  Cardiovascular: Normal S1, S2. No MRG. Regular rate and rhythm. No peripheral edema, cyanosis or pallor.  Gastrointestinal:  Soft, nondistended, moderate generalized TTP worse in the epigastrium with some involuntary guarding. Normal bowel  sounds. No appreciable masses or hepatomegaly. Rectal:  Not performed.  Msk:  Symmetrical without gross deformities. Without edema, no deformity or joint abnormality.  Neurologic:  Alert and  oriented x4;  grossly normal neurologically.  Skin:   Dry and intact without significant lesions or rashes. Psychiatric: Demonstrates good judgement and reason without abnormal affect or behaviors.  Requesting recent labs and imaging.  Assessment: 1.  Chronic epigastric pain: Previous EGD was normal in 2018, has continued with epigastric discomfort over the past few years, some relief with antacids; consider gastritis+/- H. pylori+/- PUD versus contribution of gallbladder 2.  Right upper quadrant pain: A couple of "intense" episodes over the past 3 months, apparently ultrasound and labs normal; consider gallbladder dysfunction versus relation to above 3.  Screening for colorectal cancer: Patient is due for screening colonoscopy now that she is over 44  Plan: 1.  Scheduled patient for diagnostic EGD and a screening colonoscopy in the [place] with Dr. Hilarie Fredrickson.  Did provide the patient a detailed list of risks for the procedure and she agrees to proceed.Patient is appropriate for endoscopic procedure(s) in the ambulatory ([place]) setting.  2.  Recommend the patient start Omeprazole 20 mg daily,  30-60 minutes before breakfast, prescribed #30 with 5 refills. 3.  Reviewed antireflux diet and lifestyle modifications. 4.  Reviewed low-fat diet for possible gallbladder issues 5.  We will request recent ultrasound and labs from PCP 6.  Discussed with patient that if EGD is unrevealing would recommend a HIDA scan with [facility] for further evaluation of gallbladder function 7.  Would recommend the patient start MiraLAX once daily when she starts Omeprazole as she has had issues with constipation when she initially started this medication in the past. 8.  Patient follow in clinic per recommendations after time of procedures.  Ellouise Newer, PA-C [facility] 07/05/2021, 9:06 AM  Cc: Kathyrn Lass, MD   Addendum: 07/07/2021 3:36 PM  06/18/2021 right upper quadrant ultrasound was normal.  BMP normal.  TTG IgA normal.  CBC normal.  CMP normal.  TSH normal.  No change in plan.  Ellouise Newer, PA-C

## 2021-07-05 NOTE — Patient Instructions (Addendum)
We have sent the following medications to your pharmacy for you to pick up at your convenience: Omeprazole 20 mg daily 30-60 minutes before breakfast.   Start Miralax 1 capful daily in 8 ounces of liquid.  You have been scheduled for an endoscopy and colonoscopy. Please follow the written instructions given to you at your visit today. Please pick up your prep supplies at the pharmacy within the next 1-3 days. If you use inhalers (even only as needed), please bring them with you on the day of your procedure.  If you are age 22 or older, your body mass index should be between 23-30. Your Body mass index is 25.45 kg/m. If this is out of the aforementioned range listed, please consider follow up with your Primary Care Provider.  If you are age 46 or younger, your body mass index should be between 19-25. Your Body mass index is 25.45 kg/m. If this is out of the aformentioned range listed, please consider follow up with your Primary Care Provider.   __________________________________________________________  The [facility] providers would like to encourage you to use [facility] to communicate with providers for non-urgent requests or questions.  Due to long hold times on the telephone, sending your provider a message by [facility] may be a faster and more efficient way to get a response.  Please allow 48 business hours for a response.  Please remember that this is for non-urgent requests.

## 2021-07-06 DIAGNOSIS — M7918 Myalgia, other site: Secondary | ICD-10-CM | POA: Diagnosis not present

## 2021-07-06 DIAGNOSIS — M9905 Segmental and somatic dysfunction of pelvic region: Secondary | ICD-10-CM | POA: Diagnosis not present

## 2021-07-06 DIAGNOSIS — M9902 Segmental and somatic dysfunction of thoracic region: Secondary | ICD-10-CM | POA: Diagnosis not present

## 2021-07-06 DIAGNOSIS — M9903 Segmental and somatic dysfunction of lumbar region: Secondary | ICD-10-CM | POA: Diagnosis not present

## 2021-07-06 DIAGNOSIS — M542 Cervicalgia: Secondary | ICD-10-CM | POA: Diagnosis not present

## 2021-07-06 DIAGNOSIS — M25672 Stiffness of left ankle, not elsewhere classified: Secondary | ICD-10-CM | POA: Diagnosis not present

## 2021-07-06 DIAGNOSIS — M79672 Pain in left foot: Secondary | ICD-10-CM | POA: Diagnosis not present

## 2021-07-06 DIAGNOSIS — M9901 Segmental and somatic dysfunction of cervical region: Secondary | ICD-10-CM | POA: Diagnosis not present

## 2021-07-06 DIAGNOSIS — M9906 Segmental and somatic dysfunction of lower extremity: Secondary | ICD-10-CM | POA: Diagnosis not present

## 2021-07-13 NOTE — Progress Notes (Signed)
Addendum: Reviewed and agree with assessment and management plan. Wrangler Penning M, MD  

## 2021-07-20 DIAGNOSIS — M9902 Segmental and somatic dysfunction of thoracic region: Secondary | ICD-10-CM | POA: Diagnosis not present

## 2021-07-20 DIAGNOSIS — M9906 Segmental and somatic dysfunction of lower extremity: Secondary | ICD-10-CM | POA: Diagnosis not present

## 2021-07-20 DIAGNOSIS — M25552 Pain in left hip: Secondary | ICD-10-CM | POA: Diagnosis not present

## 2021-07-20 DIAGNOSIS — M9905 Segmental and somatic dysfunction of pelvic region: Secondary | ICD-10-CM | POA: Diagnosis not present

## 2021-07-20 DIAGNOSIS — M546 Pain in thoracic spine: Secondary | ICD-10-CM | POA: Diagnosis not present

## 2021-07-20 DIAGNOSIS — M9903 Segmental and somatic dysfunction of lumbar region: Secondary | ICD-10-CM | POA: Diagnosis not present

## 2021-07-20 DIAGNOSIS — M7918 Myalgia, other site: Secondary | ICD-10-CM | POA: Diagnosis not present

## 2021-07-20 DIAGNOSIS — M25672 Stiffness of left ankle, not elsewhere classified: Secondary | ICD-10-CM | POA: Diagnosis not present

## 2021-07-20 DIAGNOSIS — M542 Cervicalgia: Secondary | ICD-10-CM | POA: Diagnosis not present

## 2021-07-20 DIAGNOSIS — M79672 Pain in left foot: Secondary | ICD-10-CM | POA: Diagnosis not present

## 2021-07-20 DIAGNOSIS — M9901 Segmental and somatic dysfunction of cervical region: Secondary | ICD-10-CM | POA: Diagnosis not present

## 2021-08-03 DIAGNOSIS — M9901 Segmental and somatic dysfunction of cervical region: Secondary | ICD-10-CM | POA: Diagnosis not present

## 2021-08-03 DIAGNOSIS — M9906 Segmental and somatic dysfunction of lower extremity: Secondary | ICD-10-CM | POA: Diagnosis not present

## 2021-08-03 DIAGNOSIS — M79672 Pain in left foot: Secondary | ICD-10-CM | POA: Diagnosis not present

## 2021-08-03 DIAGNOSIS — M542 Cervicalgia: Secondary | ICD-10-CM | POA: Diagnosis not present

## 2021-08-03 DIAGNOSIS — M9903 Segmental and somatic dysfunction of lumbar region: Secondary | ICD-10-CM | POA: Diagnosis not present

## 2021-08-03 DIAGNOSIS — M25672 Stiffness of left ankle, not elsewhere classified: Secondary | ICD-10-CM | POA: Diagnosis not present

## 2021-08-03 DIAGNOSIS — M9905 Segmental and somatic dysfunction of pelvic region: Secondary | ICD-10-CM | POA: Diagnosis not present

## 2021-08-03 DIAGNOSIS — M9902 Segmental and somatic dysfunction of thoracic region: Secondary | ICD-10-CM | POA: Diagnosis not present

## 2021-08-03 DIAGNOSIS — M546 Pain in thoracic spine: Secondary | ICD-10-CM | POA: Diagnosis not present

## 2021-08-03 DIAGNOSIS — M25552 Pain in left hip: Secondary | ICD-10-CM | POA: Diagnosis not present

## 2021-08-03 DIAGNOSIS — M7918 Myalgia, other site: Secondary | ICD-10-CM | POA: Diagnosis not present

## 2021-08-08 DIAGNOSIS — H5203 Hypermetropia, bilateral: Secondary | ICD-10-CM | POA: Diagnosis not present

## 2021-08-09 DIAGNOSIS — M9901 Segmental and somatic dysfunction of cervical region: Secondary | ICD-10-CM | POA: Diagnosis not present

## 2021-08-09 DIAGNOSIS — M79672 Pain in left foot: Secondary | ICD-10-CM | POA: Diagnosis not present

## 2021-08-09 DIAGNOSIS — M9903 Segmental and somatic dysfunction of lumbar region: Secondary | ICD-10-CM | POA: Diagnosis not present

## 2021-08-09 DIAGNOSIS — M25672 Stiffness of left ankle, not elsewhere classified: Secondary | ICD-10-CM | POA: Diagnosis not present

## 2021-08-09 DIAGNOSIS — M9905 Segmental and somatic dysfunction of pelvic region: Secondary | ICD-10-CM | POA: Diagnosis not present

## 2021-08-09 DIAGNOSIS — M9902 Segmental and somatic dysfunction of thoracic region: Secondary | ICD-10-CM | POA: Diagnosis not present

## 2021-08-09 DIAGNOSIS — M9906 Segmental and somatic dysfunction of lower extremity: Secondary | ICD-10-CM | POA: Diagnosis not present

## 2021-08-09 DIAGNOSIS — M7918 Myalgia, other site: Secondary | ICD-10-CM | POA: Diagnosis not present

## 2021-08-09 DIAGNOSIS — M25552 Pain in left hip: Secondary | ICD-10-CM | POA: Diagnosis not present

## 2021-08-15 ENCOUNTER — Encounter: Payer: BC Managed Care – PPO | Admitting: Internal Medicine

## 2021-08-16 DIAGNOSIS — M9906 Segmental and somatic dysfunction of lower extremity: Secondary | ICD-10-CM | POA: Diagnosis not present

## 2021-08-16 DIAGNOSIS — M9903 Segmental and somatic dysfunction of lumbar region: Secondary | ICD-10-CM | POA: Diagnosis not present

## 2021-08-16 DIAGNOSIS — M79672 Pain in left foot: Secondary | ICD-10-CM | POA: Diagnosis not present

## 2021-08-16 DIAGNOSIS — M9902 Segmental and somatic dysfunction of thoracic region: Secondary | ICD-10-CM | POA: Diagnosis not present

## 2021-08-16 DIAGNOSIS — M25552 Pain in left hip: Secondary | ICD-10-CM | POA: Diagnosis not present

## 2021-08-16 DIAGNOSIS — M25672 Stiffness of left ankle, not elsewhere classified: Secondary | ICD-10-CM | POA: Diagnosis not present

## 2021-08-16 DIAGNOSIS — M9905 Segmental and somatic dysfunction of pelvic region: Secondary | ICD-10-CM | POA: Diagnosis not present

## 2021-08-16 DIAGNOSIS — M7918 Myalgia, other site: Secondary | ICD-10-CM | POA: Diagnosis not present

## 2021-08-16 DIAGNOSIS — M9901 Segmental and somatic dysfunction of cervical region: Secondary | ICD-10-CM | POA: Diagnosis not present

## 2021-08-23 DIAGNOSIS — M9906 Segmental and somatic dysfunction of lower extremity: Secondary | ICD-10-CM | POA: Diagnosis not present

## 2021-08-23 DIAGNOSIS — M79672 Pain in left foot: Secondary | ICD-10-CM | POA: Diagnosis not present

## 2021-08-23 DIAGNOSIS — M7918 Myalgia, other site: Secondary | ICD-10-CM | POA: Diagnosis not present

## 2021-08-23 DIAGNOSIS — M9902 Segmental and somatic dysfunction of thoracic region: Secondary | ICD-10-CM | POA: Diagnosis not present

## 2021-08-23 DIAGNOSIS — M25672 Stiffness of left ankle, not elsewhere classified: Secondary | ICD-10-CM | POA: Diagnosis not present

## 2021-08-23 DIAGNOSIS — M9903 Segmental and somatic dysfunction of lumbar region: Secondary | ICD-10-CM | POA: Diagnosis not present

## 2021-08-23 DIAGNOSIS — M9905 Segmental and somatic dysfunction of pelvic region: Secondary | ICD-10-CM | POA: Diagnosis not present

## 2021-08-23 DIAGNOSIS — M9901 Segmental and somatic dysfunction of cervical region: Secondary | ICD-10-CM | POA: Diagnosis not present

## 2021-08-23 DIAGNOSIS — M25552 Pain in left hip: Secondary | ICD-10-CM | POA: Diagnosis not present

## 2021-08-23 DIAGNOSIS — M542 Cervicalgia: Secondary | ICD-10-CM | POA: Diagnosis not present

## 2021-08-29 ENCOUNTER — Telehealth: Payer: Self-pay | Admitting: Internal Medicine

## 2021-08-29 NOTE — Telephone Encounter (Signed)
Good Morning Dr. Hilarie Fredrickson,  Patient called to reschedule her EGD\ Colon procedure on 12/19 at 2:30 due to being out of town.  Patient was rescheduled for 1/23 at 3:30

## 2021-09-01 DIAGNOSIS — D485 Neoplasm of uncertain behavior of skin: Secondary | ICD-10-CM | POA: Diagnosis not present

## 2021-09-01 DIAGNOSIS — D2239 Melanocytic nevi of other parts of face: Secondary | ICD-10-CM | POA: Diagnosis not present

## 2021-09-01 DIAGNOSIS — D225 Melanocytic nevi of trunk: Secondary | ICD-10-CM | POA: Diagnosis not present

## 2021-09-01 DIAGNOSIS — L578 Other skin changes due to chronic exposure to nonionizing radiation: Secondary | ICD-10-CM | POA: Diagnosis not present

## 2021-09-01 DIAGNOSIS — D224 Melanocytic nevi of scalp and neck: Secondary | ICD-10-CM | POA: Diagnosis not present

## 2021-09-01 DIAGNOSIS — L821 Other seborrheic keratosis: Secondary | ICD-10-CM | POA: Diagnosis not present

## 2021-09-05 ENCOUNTER — Encounter: Payer: BC Managed Care – PPO | Admitting: Internal Medicine

## 2021-09-07 DIAGNOSIS — M9901 Segmental and somatic dysfunction of cervical region: Secondary | ICD-10-CM | POA: Diagnosis not present

## 2021-09-07 DIAGNOSIS — M9902 Segmental and somatic dysfunction of thoracic region: Secondary | ICD-10-CM | POA: Diagnosis not present

## 2021-09-07 DIAGNOSIS — M9906 Segmental and somatic dysfunction of lower extremity: Secondary | ICD-10-CM | POA: Diagnosis not present

## 2021-09-07 DIAGNOSIS — M9903 Segmental and somatic dysfunction of lumbar region: Secondary | ICD-10-CM | POA: Diagnosis not present

## 2021-09-21 DIAGNOSIS — M9906 Segmental and somatic dysfunction of lower extremity: Secondary | ICD-10-CM | POA: Diagnosis not present

## 2021-09-21 DIAGNOSIS — M9902 Segmental and somatic dysfunction of thoracic region: Secondary | ICD-10-CM | POA: Diagnosis not present

## 2021-09-21 DIAGNOSIS — M9903 Segmental and somatic dysfunction of lumbar region: Secondary | ICD-10-CM | POA: Diagnosis not present

## 2021-09-21 DIAGNOSIS — M9901 Segmental and somatic dysfunction of cervical region: Secondary | ICD-10-CM | POA: Diagnosis not present

## 2021-10-05 DIAGNOSIS — M9903 Segmental and somatic dysfunction of lumbar region: Secondary | ICD-10-CM | POA: Diagnosis not present

## 2021-10-05 DIAGNOSIS — M9901 Segmental and somatic dysfunction of cervical region: Secondary | ICD-10-CM | POA: Diagnosis not present

## 2021-10-05 DIAGNOSIS — M9902 Segmental and somatic dysfunction of thoracic region: Secondary | ICD-10-CM | POA: Diagnosis not present

## 2021-10-05 DIAGNOSIS — M9906 Segmental and somatic dysfunction of lower extremity: Secondary | ICD-10-CM | POA: Diagnosis not present

## 2021-10-10 ENCOUNTER — Encounter: Payer: Self-pay | Admitting: Internal Medicine

## 2021-10-10 ENCOUNTER — Ambulatory Visit (AMBULATORY_SURGERY_CENTER): Payer: BC Managed Care – PPO | Admitting: Internal Medicine

## 2021-10-10 VITALS — BP 130/85 | HR 65 | Temp 97.5°F | Resp 13 | Ht 62.0 in | Wt 139.0 lb

## 2021-10-10 DIAGNOSIS — K449 Diaphragmatic hernia without obstruction or gangrene: Secondary | ICD-10-CM

## 2021-10-10 DIAGNOSIS — K319 Disease of stomach and duodenum, unspecified: Secondary | ICD-10-CM | POA: Diagnosis not present

## 2021-10-10 DIAGNOSIS — R1013 Epigastric pain: Secondary | ICD-10-CM | POA: Diagnosis not present

## 2021-10-10 DIAGNOSIS — Z1211 Encounter for screening for malignant neoplasm of colon: Secondary | ICD-10-CM | POA: Diagnosis not present

## 2021-10-10 DIAGNOSIS — R1011 Right upper quadrant pain: Secondary | ICD-10-CM | POA: Diagnosis not present

## 2021-10-10 DIAGNOSIS — G8929 Other chronic pain: Secondary | ICD-10-CM

## 2021-10-10 MED ORDER — SODIUM CHLORIDE 0.9 % IV SOLN: 500.0000 mL | Freq: Once | INTRAVENOUS | Status: DC

## 2021-10-10 NOTE — Progress Notes (Signed)
No problems noted in the recovery room. maw 

## 2021-10-10 NOTE — Progress Notes (Signed)
VSS, transported to PACU °

## 2021-10-10 NOTE — Progress Notes (Signed)
Called to room to assist during endoscopic procedure.  Patient ID and intended procedure confirmed with present staff. Received instructions for my participation in the procedure from the performing physician.  

## 2021-10-10 NOTE — Op Note (Signed)
Allison Mcclure: Allison Mcclure Procedure Date: 10/10/2021 2:45 PM MRN: [id number] Endoscopist: Jerene Bears , MD Age: 46 Referring MD:  Date of Birth: 07-Jul-1976 Gender: Female Account #: [id number] Procedure:                Upper GI endoscopy Indications:              Epigastric abdominal pain, Abdominal pain in the                            right upper quadrant -- responsive to PPI                            (omeprazole) Medicines:                Monitored Anesthesia Care Procedure:                Pre-Anesthesia Assessment:                           - Prior to the procedure, a History and Physical                            was performed, and patient medications and                            allergies were reviewed. The patient's tolerance of                            previous anesthesia was also reviewed. The risks                            and benefits of the procedure and the sedation                            options and risks were discussed with the patient.                            All questions were answered, and informed consent                            was obtained. Prior Anticoagulants: The patient has                            taken no previous anticoagulant or antiplatelet                            agents. ASA Grade Assessment: II - A patient with                            mild systemic disease. After reviewing the risks                            and benefits, the patient was deemed in  satisfactory condition to undergo the procedure.                           After obtaining informed consent, the endoscope was                            passed under direct vision. Throughout the                            procedure, the patient's blood pressure, pulse, and                            oxygen saturations were monitored continuously. The                            Endoscope was introduced through the mouth, and                             advanced to the second part of duodenum. The upper                            GI endoscopy was accomplished without difficulty.                            The patient tolerated the procedure well. Scope In: Scope Out: Findings:                 The examined esophagus was normal.                           A 2 cm hiatal hernia was present.                           The entire examined stomach was normal. Biopsies                            were taken with a cold forceps for histology and                            Helicobacter pylori testing.                           The examined duodenum was normal. Complications:            No immediate complications. Estimated Blood Loss:     Estimated blood loss was minimal. Impression:               - Normal esophagus.                           - 2 cm hiatal hernia.                           - Normal stomach. Biopsied.                           - Normal examined  duodenum. Recommendation:           - Patient has a contact number available for                            emergencies. The signs and symptoms of potential                            delayed complications were discussed with the                            patient. Return to normal activities tomorrow.                            Written discharge instructions were provided to the                            patient.                           - Resume previous diet.                           - Continue present medications. Okay to continue                            omeprazole 20 mg daily given improvement in                            symptoms.                           - Await pathology results. Jerene Bears, MD 10/10/2021 4:35:27 PM This report has been signed electronically.

## 2021-10-10 NOTE — Progress Notes (Signed)
GASTROENTEROLOGY PROCEDURE H&P NOTE   Primary Care Physician: Eilene Ghazi, NP    Reason for Procedure:  Epigastric pain, right upper quadrant pain which is intermittent, screening colonoscopy  Plan:    EGD and colonoscopy  Patient is appropriate for endoscopic procedure(s) in the ambulatory ([facility]) setting.  The nature of the procedure, as well as the risks, benefits, and alternatives were carefully and thoroughly reviewed with the patient. Ample time for discussion and questions allowed. The patient understood, was satisfied, and agreed to proceed.     HPI: Allison Mcclure is a 46 y.o. female who presents for EGD and colonoscopy.  See note from October 2022 when she met with Ellouise Newer, PA-C.  Tolerated the prep.  No recent chest pain or shortness of breath.  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/2015   ER/PR +, Her2 neu -; neg lymph nodes; s/p lumpectomy, radiation, attempted tamoxifen with side effects   Cervical dysplasia    Gastrointestinal disorder    GERD (gastroesophageal reflux disease)    Heart palpitations    IBS (irritable bowel syndrome)    Ovarian cyst     Past Surgical History:  Procedure Laterality Date   arm surgery     left   BREAST LUMPECTOMY WITH RADIOACTIVE SEED AND SENTINEL LYMPH NODE BIOPSY Right 03/01/2016   Procedure: RIGHT BREAST LUMPECTOMY WITH RADIOACTIVE SEED AND SENTINEL LYMPH NODE BIOPSY;  Surgeon: Stark Klein, MD;  Location: [facility];  Service: General;  Laterality: Right;   COLONOSCOPY     CRYOTHERAPY     EXCISION MASS LOWER EXTREMETIES Right 03/01/2016   Procedure: EXCISION MASS RIGHT THIGH;  Surgeon: Stark Klein, MD;  Location: [facility];  Service: General;  Laterality: Right;   MOUTH SURGERY     "gum surgery" x 2   TONSILLECTOMY     WISDOM TOOTH EXTRACTION      Prior to Admission medications    Medication Sig Start Date End Date Taking? Authorizing Provider  ALPRAZolam Duanne Moron) 0.5 MG tablet Take 0.5 mg by mouth daily as needed. 06/08/21  Yes [provider]  loratadine (ALAVERT) 10 MG dissolvable tablet 1 tablet on the tongue and allow to dissolve   Yes [provider]  omeprazole (PRILOSEC) 20 MG capsule Take 1 capsule (20 mg total) by mouth daily. 07/05/21  Yes Levin Erp, PA  propranolol (INDERAL) 10 MG tablet Take 10 mg by mouth 2 (two) times daily as needed. 06/27/21  Yes [provider]  ALREX 0.2 % SUSP Apply 1 drop to eye. 3-4 times daily Patient not taking: Reported on 10/10/2021 06/03/21   [provider]  cetirizine HCl (ZYRTEC) 5 MG/5ML SOLN Take 5 mLs (5 mg total) by mouth daily. 05/06/19   Nicholas Lose, MD  cholecalciferol (VITAMIN D) 25 MCG (1000 UT) tablet Take 1 tablet (1,000 Units total) by mouth daily. Patient not taking: Reported on 10/10/2021 05/06/19   Nicholas Lose, MD  fluticasone [facility]) 50 MCG/ACT nasal spray Place 1 spray into both nostrils as needed. 12/06/19   [provider]  Menaquinone-7 (VITAMIN K2 PO) Take 1 capsule by mouth daily.    [provider]    Current Outpatient Medications  Medication Sig Dispense Refill   ALPRAZolam (XANAX) 0.5 MG tablet Take 0.5 mg by mouth daily as needed.     loratadine (ALAVERT) 10  MG dissolvable tablet 1 tablet on the tongue and allow to dissolve     omeprazole (PRILOSEC) 20 MG capsule Take 1 capsule (20 mg total) by mouth daily. 30 capsule 5   propranolol (INDERAL) 10 MG tablet Take 10 mg by mouth 2 (two) times daily as needed.     ALREX 0.2 % SUSP Apply 1 drop to eye. 3-4 times daily (Patient not taking: Reported on 10/10/2021)     cetirizine HCl (ZYRTEC) 5 MG/5ML SOLN Take 5 mLs (5 mg total) by mouth daily. 300 mL    cholecalciferol (VITAMIN D) 25 MCG (1000 UT) tablet Take 1 tablet (1,000 Units total) by mouth daily. (Patient not taking: Reported on  10/10/2021)     fluticasone (FLONASE) 50 MCG/ACT nasal spray Place 1 spray into both nostrils as needed.     Menaquinone-7 (VITAMIN K2 PO) Take 1 capsule by mouth daily.     Current Facility-Administered Medications  Medication Dose Route Frequency Provider Last Rate Last Admin   0.9 %  sodium chloride infusion  500 mL Intravenous Once Promise Weldin, Lajuan Lines, MD        Allergies as of 10/10/2021 - Review Complete 10/10/2021  Allergen Reaction Noted   Sunscreen spf30 [facility] pf spf15] Hives, Itching, and Rash 08/21/2016   Cephalexin Rash and Other (See Comments) 10/30/2016    Family History  Problem Relation Age of Onset   Hypertension Mother    Hyperlipidemia Mother    Hyperlipidemia Father    Prostate cancer Father    Hypertension Father    Prostate cancer Paternal Uncle    Bone cancer Paternal Grandfather    Colon cancer Other        PGGF   Esophageal cancer Neg Hx    Rectal cancer Neg Hx    Stomach cancer Neg Hx     Social History   Socioeconomic History   Marital status: Single    Spouse name: Not on file   Number of children: 0   Years of education: Not on file   Highest education level: Not on file  Occupational History   Occupation: Secondary school teacher  Tobacco Use   Smoking status: Never   Smokeless tobacco: Never  Vaping Use   Vaping Use: Never used  Substance and Sexual Activity   Alcohol use: Yes    Comment: 0-1 drinks daily   Drug use: No   Sexual activity: Yes    Birth control/protection: None  Other Topics Concern   Not on file  Social History Narrative   Not on file   Social Determinants of Health   Financial Resource Strain: Not on file  Food Insecurity: Not on file  Transportation Needs: Not on file  Physical Activity: Not on file  Stress: Not on file  Social Connections: Not on file  Intimate Partner Violence: Not on file    Physical Exam: Vital signs in last 24 hours: '@BP'  105/74    Pulse 71    Temp (!) 97.5 F (36.4 C) (Temporal)    Ht 5'  2" (1.575 m)    Wt 139 lb (63 kg)    SpO2 100%    BMI 25.42 kg/m  GEN: NAD EYE: Sclerae anicteric ENT: MMM CV: Non-tachycardic Pulm: CTA b/l GI: Soft, NT/ND NEURO:  Alert & Oriented x 3   Zenovia Jarred, MD [facility]  10/10/2021 4:09 PM

## 2021-10-10 NOTE — Patient Instructions (Addendum)
Handout was given to your care partner on Hiatal Hernia. Per Dr. Hilarie Fredrickson continue OMEPRAZOLE 20 mg daily given improvement in symptoms. You may resume your current medications today. Await biopsy results.  May take 1-3 weeks to receive pathology results. Repeat colonoscopy in 10 years for screening purposes. Please call if any questions or concerns.       YOU HAD AN ENDOSCOPIC PROCEDURE TODAY AT [facility]:   Refer to the procedure report that was given to you for any specific questions about what was found during the examination.  If the procedure report does not answer your questions, please call your gastroenterologist to clarify.  If you requested that your care partner not be given the details of your procedure findings, then the procedure report has been included in a sealed envelope for you to review at your convenience later.  YOU SHOULD EXPECT: Some feelings of bloating in the abdomen. Passage of more gas than usual.  Walking can help get rid of the air that was put into your GI tract during the procedure and reduce the bloating. If you had a lower endoscopy (such as a colonoscopy or flexible sigmoidoscopy) you may notice spotting of blood in your stool or on the toilet paper. If you underwent a bowel prep for your procedure, you may not have a normal bowel movement for a few days.  Please Note:  You might notice some irritation and congestion in your nose or some drainage.  This is from the oxygen used during your procedure.  There is no need for concern and it should clear up in a day or so.  SYMPTOMS TO REPORT IMMEDIATELY:  Following lower endoscopy (colonoscopy or flexible sigmoidoscopy):  Excessive amounts of blood in the stool  Significant tenderness or worsening of abdominal pains  Swelling of the abdomen that is new, acute  Fever of 100F or higher  Following upper endoscopy (EGD)  Vomiting of blood or coffee ground material  New chest pain or pain under  the shoulder blades  Painful or persistently difficult swallowing  New shortness of breath  Fever of 100F or higher  Black, tarry-looking stools  For urgent or emergent issues, a gastroenterologist can be reached at any hour by calling [phone]. Do not use MyChart messaging for urgent concerns.    DIET:  We do recommend a small meal at first, but then you may proceed to your regular diet.  Drink plenty of fluids but you should avoid alcoholic beverages for 24 hours.  ACTIVITY:  You should plan to take it easy for the rest of today and you should NOT DRIVE or use heavy machinery until tomorrow (because of the sedation medicines used during the test).    FOLLOW UP: Our staff will call the number listed on your records 48-72 hours following your procedure to check on you and address any questions or concerns that you may have regarding the information given to you following your procedure. If we do not reach you, we will leave a message.  We will attempt to reach you two times.  During this call, we will ask if you have developed any symptoms of COVID 19. If you develop any symptoms (ie: fever, flu-like symptoms, shortness of breath, cough etc.) before then, please call [phone].  If you test positive for Covid 19 in the 2 weeks post procedure, please call and report this information to [place].    If any biopsies were taken you will be contacted by phone  or by letter within the next 1-3 weeks.  Please call us at [phone] if you have not heard about the biopsies in 3 weeks.    SIGNATURES/CONFIDENTIALITY: You and/or your care partner have signed paperwork which will be entered into your electronic medical record.  These signatures attest to the fact that that the information above on your After Visit Summary has been reviewed and is understood.  Full responsibility of the confidentiality of this discharge information lies with you and/or your care-partner.

## 2021-10-10 NOTE — Op Note (Signed)
[facility] Patient Name: Allison Mcclure Procedure Date: 10/10/2021 2:44 PM MRN: [id number] Endoscopist: Jerene Bears , MD Age: 46 Referring MD:  Date of Birth: 1975-10-10 Gender: Female Account #: [id number] Procedure:                Colonoscopy Indications:              Screening for colorectal malignant neoplasm Medicines:                Monitored Anesthesia Care Procedure:                Pre-Anesthesia Assessment:                           - Prior to the procedure, a History and Physical                            was performed, and patient medications and                            allergies were reviewed. The patient's tolerance of                            previous anesthesia was also reviewed. The risks                            and benefits of the procedure and the sedation                            options and risks were discussed with the patient.                            All questions were answered, and informed consent                            was obtained. Prior Anticoagulants: The patient has                            taken no previous anticoagulant or antiplatelet                            agents. ASA Grade Assessment: II - A patient with                            mild systemic disease. After reviewing the risks                            and benefits, the patient was deemed in                            satisfactory condition to undergo the procedure.                           After obtaining informed consent, the colonoscope  was passed under direct vision. Throughout the                            procedure, the patient's blood pressure, pulse, and                            oxygen saturations were monitored continuously. The                            Olympus PCF-[id number] (#[id number]) Colonoscope was                            introduced through the anus and advanced to the                            cecum, identified by  appendiceal orifice and                            ileocecal valve. The colonoscopy was performed                            without difficulty. The patient tolerated the                            procedure well. The quality of the bowel                            preparation was good. The ileocecal valve,                            appendiceal orifice, and rectum were photographed. Scope In: 4:22:05 PM Scope Out: 4:32:59 PM Scope Withdrawal Time: 0 hours 8 minutes 9 seconds  Total Procedure Duration: 0 hours 10 minutes 54 seconds  Findings:                 The digital rectal exam was normal.                           The entire examined colon appeared normal on direct                            and retroflexion views. Complications:            No immediate complications. Estimated Blood Loss:     Estimated blood loss: none. Impression:               - The entire examined colon is normal on direct and                            retroflexion views.                           - No specimens collected. Recommendation:           - Patient has a contact number available for  emergencies. The signs and symptoms of potential                            delayed complications were discussed with the                            patient. Return to normal activities tomorrow.                            Written discharge instructions were provided to the                            patient.                           - Resume previous diet.                           - Continue present medications.                           - Repeat colonoscopy in 10 years for screening                            purposes. Jerene Bears, MD 10/10/2021 4:36:39 PM This report has been signed electronically.

## 2021-10-10 NOTE — Progress Notes (Signed)
VS-DT 

## 2021-10-12 ENCOUNTER — Telehealth: Payer: Self-pay

## 2021-10-12 NOTE — Telephone Encounter (Signed)
°  Follow up Call-  Call back number 10/10/2021  Post procedure Call Back phone  # [phone]  Permission to leave phone message Yes  Some recent data might be hidden     Patient questions:  Do you have a fever, pain , or abdominal swelling? No. Pain Score  0 *  Have you tolerated food without any problems? Yes.    Have you been able to return to your normal activities? Yes.    Do you have any questions about your discharge instructions: Diet   No. Medications  No. Follow up visit  No.  Do you have questions or concerns about your Care? No.  Actions: * If pain score is 4 or above: No action needed, pain <4.

## 2021-10-18 ENCOUNTER — Encounter: Payer: Self-pay | Admitting: Internal Medicine

## 2021-10-19 DIAGNOSIS — M9906 Segmental and somatic dysfunction of lower extremity: Secondary | ICD-10-CM | POA: Diagnosis not present

## 2021-10-19 DIAGNOSIS — M9903 Segmental and somatic dysfunction of lumbar region: Secondary | ICD-10-CM | POA: Diagnosis not present

## 2021-10-19 DIAGNOSIS — M9902 Segmental and somatic dysfunction of thoracic region: Secondary | ICD-10-CM | POA: Diagnosis not present

## 2021-10-19 DIAGNOSIS — M9901 Segmental and somatic dysfunction of cervical region: Secondary | ICD-10-CM | POA: Diagnosis not present

## 2021-10-24 NOTE — Progress Notes (Signed)
Cardiology Office Note:   Date:  10/25/2021  NAME:  Allison Mcclure    MRN: [id number] DOB:  [birth year]   PCP:  Eilene Ghazi, NP  Cardiologist:  None  Electrophysiologist:  None   Referring MD: Eilene Ghazi, NP   Chief Complaint  Patient presents with   Palpitations         History of Present Illness:   Allison Mcclure is a 46 y.o. female with a hx of allergies who is being seen today for the evaluation of palpitations at the request of Eilene Ghazi, NP.  She reports for the last 5 to 6 months has had episodes of palpitations.  Described as a fluttering in her chest.  She reports it occurs all day.  She describes skipped beats as well.  She reports it is worse with eating.  She also reports dieting and stress can trigger this.  She was started on Xanax by her primary care physician.  Xanax did improve her symptoms.  She reports a recent EGD and colonoscopy.  She was informed she had extra heartbeats during that exam.  I suspect this was seen on the monitor.  She reports no history of high blood pressure.  She is not diabetic.  She has never had a heart attack or stroke.  Both of her parents are living and well.  Her EKG is normal.  Cardiovascular examination is normal.  She reports 1 cup of caffeine per day.  She reports poor sleep.  She presents with her fianc.  She reports no concerning for sleep apnea snoring or fatigue.  She reports some anxiety and stress.  She reports symptoms are occurring when she does not feel anxious or stressed.  She is not exercising much.  She works as a Secondary school teacher.  She does not have any children.  She was given propranolol by her primary care physician.  I recommended she not take this until able to complete a monitor.  Lab work from September shows a TSH of 0.92.  Hemoglobin 13.7.  Total cholesterol 244, HDL 58, LDL 157, triglycerides 159.  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 +, Her2 neu -; neg lymph nodes; s/p lumpectomy, radiation, attempted tamoxifen with side effects   Cervical dysplasia    Gastrointestinal disorder    GERD (gastroesophageal reflux disease)    Heart palpitations    IBS (irritable bowel syndrome)    Ovarian cyst     Past Surgical History: Past Surgical History:  Procedure Laterality Date   arm surgery     left   BREAST LUMPECTOMY WITH RADIOACTIVE SEED AND SENTINEL LYMPH NODE BIOPSY Right 03/01/2016   Procedure: RIGHT BREAST LUMPECTOMY WITH RADIOACTIVE SEED AND SENTINEL LYMPH NODE BIOPSY;  Surgeon: Stark Klein, MD;  Location: [place];  Service: General;  Laterality: Right;   COLONOSCOPY     CRYOTHERAPY     EXCISION MASS LOWER EXTREMETIES Right 03/01/2016   Procedure: EXCISION MASS RIGHT THIGH;  Surgeon: Stark Klein, MD;  Location: [place];  Service: General;  Laterality: Right;   MOUTH SURGERY     "gum surgery" x 2   TONSILLECTOMY     WISDOM TOOTH EXTRACTION      Current Medications: Current Meds  Medication Sig   ALPRAZolam (XANAX) 0.5 MG tablet Take 0.5 mg by  mouth daily as needed.   loratadine (ALAVERT) 10 MG dissolvable tablet Take 10 mg by mouth daily.   omeprazole (PRILOSEC) 20 MG capsule Take 1 capsule (20 mg total) by mouth daily.   propranolol (INDERAL) 10 MG tablet Take 10 mg by mouth 2 (two) times daily as needed.     Allergies:    Sunscreen spf30 [solbar pf spf15] and Cephalexin   Social History: Social History   Socioeconomic History   Marital status: Single    Spouse name: Not on file   Number of children: 0   Years of education: Not on file   Highest education level: Not on file  Occupational History   Occupation: Secondary school teacher  Tobacco Use   Smoking status: Never   Smokeless tobacco: Never  Vaping Use   Vaping Use: Never used  Substance and Sexual Activity   Alcohol use: Yes    Comment: 0-1  drinks daily   Drug use: No   Sexual activity: Yes    Birth control/protection: None  Other Topics Concern   Not on file  Social History Narrative   Not on file   Social Determinants of Health   Financial Resource Strain: Not on file  Food Insecurity: Not on file  Transportation Needs: Not on file  Physical Activity: Not on file  Stress: Not on file  Social Connections: Not on file     Family History: The patient's family history includes Bone cancer in her paternal grandfather; Colon cancer in an other family member; Heart failure in her maternal grandfather and maternal grandmother; Hyperlipidemia in her father and mother; Hypertension in her father and mother; Prostate cancer in her father and paternal uncle. There is no history of Esophageal cancer, Rectal cancer, or Stomach cancer.  ROS:   All other ROS reviewed and negative. Pertinent positives noted in the HPI.     EKGs/Labs/Other Studies Reviewed:   The following studies were personally reviewed by me today:  EKG:  EKG is ordered today.  The ekg ordered today demonstrates normal sinus rhythm heart 72, no acute ischemic changes or evidence of infarction, and was personally reviewed by me.   Recent Labs: No results found for requested labs within last 8760 hours.   Recent Lipid Panel No results found for: CHOL, TRIG, HDL, CHOLHDL, VLDL, LDLCALC, LDLDIRECT  Physical Exam:   VS:  BP 124/84 (BP Location: Left Arm, Patient Position: Sitting, Cuff Size: Normal)    Pulse 72    Ht '5\' 2"'  (1.575 m)    Wt 135 lb 9.6 oz (61.5 kg)    SpO2 100%    BMI 24.80 kg/m    Wt Readings from Last 3 Encounters:  10/25/21 135 lb 9.6 oz (61.5 kg)  10/10/21 139 lb (63 kg)  07/05/21 139 lb 2 oz (63.1 kg)    General: Well nourished, well developed, in no acute distress Head: Atraumatic, normal size  Eyes: PEERLA, EOMI  Neck: Supple, no JVD Endocrine: No thryomegaly Cardiac: Normal S1, S2; RRR; no murmurs, rubs, or gallops Lungs: Clear to  auscultation bilaterally, no wheezing, rhonchi or rales  Abd: Soft, nontender, no hepatomegaly  Ext: No edema, pulses 2+ Musculoskeletal: No deformities, BUE and BLE strength normal and equal Skin: Warm and dry, no rashes   Neuro: Alert and oriented to person, place, time, and situation, CNII-XII grossly intact, no focal deficits  Psych: Normal mood and affect   ASSESSMENT:   GABRIEL PAULDING is a 46 y.o. female who presents for the  following: 1. Palpitations     PLAN:   1. Palpitations -Symptoms of fluttering in her chest.  Associated with stress and anxiety.  Seems to be partially improved with Xanax.  EKG is normal in office.  TSH in September was normal.  She is not anemic.  Cardiovascular exam is normal as well.  Highly suspect this is all stress related.  To exclude arrhythmia we will proceed with a 3-day Zio patch.  I have recommended she not take propranolol until we determine what we are dealing with.  Regardless I recommended adequate hydration, proper exercise, and stress reduction.  She will reach back out to her primary care physician about Xanax.  This does seem to help.  Disposition: Return if symptoms worsen or fail to improve.  Medication Adjustments/Labs and Tests Ordered: Current medicines are reviewed at length with the patient today.  Concerns regarding medicines are outlined above.  Orders Placed This Encounter  Procedures   LONG TERM MONITOR (3-14 DAYS)   EKG 12-Lead   No orders of the defined types were placed in this encounter.   Patient Instructions  Medication Instructions:  The current medical regimen is effective;  continue present plan and medications.  *If you need a refill on your cardiac medications before your next appointment, please call your pharmacy*   Testing/Procedures: [place] Monitor Instructions  Your physician has requested you wear a ZIO patch monitor for 3 days.  This is a single patch monitor. [employer] supplies one  patch monitor per enrollment. Additional stickers are not available. Please do not apply patch if you will be having a Nuclear Stress Test,  Echocardiogram, Cardiac CT, MRI, or Chest Xray during the period you would be wearing the  monitor. The patch cannot be worn during these tests. You cannot remove and re-apply the  ZIO XT patch monitor.  Your ZIO patch monitor will be mailed 3 day USPS to your address on file. It may take 3-5 days  to receive your monitor after you have been enrolled.  Once you have received your monitor, please review the enclosed instructions. Your monitor  has already been registered assigning a specific monitor serial # to you.  Billing and Patient Assistance Program Information  We have supplied [employer] with any of your insurance information on file for billing purposes. [employer] offers a sliding scale Patient Assistance Program for patients that do not have  insurance, or whose insurance does not completely cover the cost of the ZIO monitor.  You must apply for the Patient Assistance Program to qualify for this discounted rate.  To apply, please call [employer] at [phone], select option 4, select option 2, ask to apply for  Patient Assistance Program. Theodore Demark will ask your household income, and how many people  are in your household. They will quote your out-of-pocket cost based on that information.  [employer] will also be able to set up a 91-month interest-free payment plan if needed.  Applying the monitor   Shave hair from upper left chest.  Hold abrader disc by orange tab. Rub abrader in 40 strokes over the upper left chest as  indicated in your monitor instructions.  Clean area with 4 enclosed alcohol pads. Let dry.  Apply patch as indicated in monitor instructions. Patch will be placed under collarbone on left  side of chest with arrow pointing upward.  Rub patch adhesive wings for 2 minutes. Remove white label marked "1". Remove the white  label marked  "2". Rub patch adhesive  wings for 2 additional minutes.  While looking in a mirror, press and release button in center of patch. A small green light will  flash 3-4 times. This will be your only indicator that the monitor has been turned on.  Do not shower for the first 24 hours. You may shower after the first 24 hours.  Press the button if you feel a symptom. You will hear a small click. Record Date, Time and  Symptom in the Patient Logbook.  When you are ready to remove the patch, follow instructions on the last 2 pages of Patient  Logbook. Stick patch monitor onto the last page of Patient Logbook.  Place Patient Logbook in the blue and white box. Use locking tab on box and tape box closed  securely. The blue and white box has prepaid postage on it. Please place it in the mailbox as  soon as possible. Your physician should have your test results approximately 7 days after the  monitor has been mailed back to [facility].  Call [place] at [phone] if you have questions regarding  your ZIO XT patch monitor. Call them immediately if you see an orange light blinking on your  monitor.  If your monitor falls off in less than 4 days, contact our Monitor department at [phone].  If your monitor becomes loose or falls off after 4 days call [employer] at [phone] for  suggestions on securing your monitor    Follow-Up: At [facility], you and your health needs are our priority.  As part of our continuing mission to provide you with exceptional heart care, we have created designated Provider Care Teams.  These Care Teams include your primary Cardiologist (physician) and Advanced Practice Providers (APPs -  Physician Assistants and Nurse Practitioners) who all work together to provide you with the care you need, when you need it.  We recommend signing up for the patient portal called "MyChart".  Sign up information is provided on this After Visit Summary.  MyChart  is used to connect with patients for Virtual Visits (Telemedicine).  Patients are able to view lab/test results, encounter notes, upcoming appointments, etc.  Non-urgent messages can be sent to your provider as well.   To learn more about what you can do with MyChart, go to [email or web address].    Your next appointment:   As needed  The format for your next appointment:   In Person  Provider:   Eleonore Chiquito, MD       Signed, Addison Naegeli. Audie Box, MD, [place]  [address] [place], [place] [address] [phone]  10/25/2021 2:26 PM

## 2021-10-25 ENCOUNTER — Other Ambulatory Visit: Payer: Self-pay

## 2021-10-25 ENCOUNTER — Ambulatory Visit: Payer: BC Managed Care – PPO | Admitting: Cardiovascular Disease

## 2021-10-25 ENCOUNTER — Encounter: Payer: Self-pay | Admitting: Cardiovascular Disease

## 2021-10-25 ENCOUNTER — Ambulatory Visit (INDEPENDENT_AMBULATORY_CARE_PROVIDER_SITE_OTHER): Payer: BC Managed Care – PPO

## 2021-10-25 VITALS — BP 124/84 | HR 72 | Ht 62.0 in | Wt 135.6 lb

## 2021-10-25 DIAGNOSIS — R002 Palpitations: Secondary | ICD-10-CM

## 2021-10-25 DIAGNOSIS — D485 Neoplasm of uncertain behavior of skin: Secondary | ICD-10-CM | POA: Diagnosis not present

## 2021-10-25 DIAGNOSIS — L988 Other specified disorders of the skin and subcutaneous tissue: Secondary | ICD-10-CM | POA: Diagnosis not present

## 2021-10-25 NOTE — Progress Notes (Unsigned)
Enrolled for [employer] to mail a ZIO XT long term holter monitor to the patients address on file.  

## 2021-10-25 NOTE — Patient Instructions (Signed)
Medication Instructions:  The current medical regimen is effective;  continue present plan and medications.  *If you need a refill on your cardiac medications before your next appointment, please call your pharmacy*   Testing/Procedures: [place] Monitor Instructions  Your physician has requested you wear a ZIO patch monitor for 3 days.  This is a single patch monitor. [employer] supplies one patch monitor per enrollment. Additional stickers are not available. Please do not apply patch if you will be having a Nuclear Stress Test,  Echocardiogram, Cardiac CT, MRI, or Chest Xray during the period you would be wearing the  monitor. The patch cannot be worn during these tests. You cannot remove and re-apply the  ZIO XT patch monitor.  Your ZIO patch monitor will be mailed 3 day USPS to your address on file. It may take 3-5 days  to receive your monitor after you have been enrolled.  Once you have received your monitor, please review the enclosed instructions. Your monitor  has already been registered assigning a specific monitor serial # to you.  Billing and Patient Assistance Program Information  We have supplied [employer] with any of your insurance information on file for billing purposes. [employer] offers a sliding scale Patient Assistance Program for patients that do not have  insurance, or whose insurance does not completely cover the cost of the ZIO monitor.  You must apply for the Patient Assistance Program to qualify for this discounted rate.  To apply, please call [employer] at [phone], select option 4, select option 2, ask to apply for  Patient Assistance Program. Theodore Demark will ask your household income, and how many people  are in your household. They will quote your out-of-pocket cost based on that information.  [employer] will also be able to set up a 66-month, interest-free payment plan if needed.  Applying the monitor   Shave hair from upper left chest.  Hold abrader disc  by orange tab. Rub abrader in 40 strokes over the upper left chest as  indicated in your monitor instructions.  Clean area with 4 enclosed alcohol pads. Let dry.  Apply patch as indicated in monitor instructions. Patch will be placed under collarbone on left  side of chest with arrow pointing upward.  Rub patch adhesive wings for 2 minutes. Remove white label marked "1". Remove the white  label marked "2". Rub patch adhesive wings for 2 additional minutes.  While looking in a mirror, press and release button in center of patch. A small green light will  flash 3-4 times. This will be your only indicator that the monitor has been turned on.  Do not shower for the first 24 hours. You may shower after the first 24 hours.  Press the button if you feel a symptom. You will hear a small click. Record Date, Time and  Symptom in the Patient Logbook.  When you are ready to remove the patch, follow instructions on the last 2 pages of Patient  Logbook. Stick patch monitor onto the last page of Patient Logbook.  Place Patient Logbook in the blue and white box. Use locking tab on box and tape box closed  securely. The blue and white box has prepaid postage on it. Please place it in the mailbox as  soon as possible. Your physician should have your test results approximately 7 days after the  monitor has been mailed back to [facility].  Call [place] at [phone] if you have questions regarding  your ZIO XT  patch monitor. Call them immediately if you see an orange light blinking on your  monitor.  If your monitor falls off in less than 4 days, contact our Monitor department at [phone].  If your monitor becomes loose or falls off after 4 days call [employer] at [phone] for  suggestions on securing your monitor    Follow-Up: At [facility], you and your health needs are our priority.  As part of our continuing mission to provide you with exceptional heart care, we  have created designated Provider Care Teams.  These Care Teams include your primary Cardiologist (physician) and Advanced Practice Providers (APPs -  Physician Assistants and Nurse Practitioners) who all work together to provide you with the care you need, when you need it.  We recommend signing up for the patient portal called "MyChart".  Sign up information is provided on this After Visit Summary.  MyChart is used to connect with patients for Virtual Visits (Telemedicine).  Patients are able to view lab/test results, encounter notes, upcoming appointments, etc.  Non-urgent messages can be sent to your provider as well.   To learn more about what you can do with MyChart, go to [email or web address].    Your next appointment:   As needed  The format for your next appointment:   In Person  Provider:   Eleonore Chiquito, MD

## 2021-11-01 DIAGNOSIS — R002 Palpitations: Secondary | ICD-10-CM | POA: Diagnosis not present

## 2021-11-09 DIAGNOSIS — M9902 Segmental and somatic dysfunction of thoracic region: Secondary | ICD-10-CM | POA: Diagnosis not present

## 2021-11-09 DIAGNOSIS — M9903 Segmental and somatic dysfunction of lumbar region: Secondary | ICD-10-CM | POA: Diagnosis not present

## 2021-11-09 DIAGNOSIS — M9906 Segmental and somatic dysfunction of lower extremity: Secondary | ICD-10-CM | POA: Diagnosis not present

## 2021-11-09 DIAGNOSIS — M9901 Segmental and somatic dysfunction of cervical region: Secondary | ICD-10-CM | POA: Diagnosis not present

## 2021-11-11 ENCOUNTER — Encounter: Payer: Self-pay | Admitting: Cardiovascular Disease

## 2021-11-14 DIAGNOSIS — R002 Palpitations: Secondary | ICD-10-CM | POA: Diagnosis not present

## 2021-11-15 DIAGNOSIS — L723 Sebaceous cyst: Secondary | ICD-10-CM | POA: Diagnosis not present

## 2021-11-15 DIAGNOSIS — L570 Actinic keratosis: Secondary | ICD-10-CM | POA: Diagnosis not present

## 2021-11-16 ENCOUNTER — Other Ambulatory Visit: Payer: Self-pay

## 2021-11-16 DIAGNOSIS — I493 Ventricular premature depolarization: Secondary | ICD-10-CM

## 2021-11-16 DIAGNOSIS — R002 Palpitations: Secondary | ICD-10-CM

## 2021-11-28 ENCOUNTER — Other Ambulatory Visit: Payer: Self-pay

## 2021-11-28 ENCOUNTER — Ambulatory Visit (HOSPITAL_COMMUNITY): Payer: BC Managed Care – PPO | Attending: Internal Medicine

## 2021-11-28 DIAGNOSIS — I493 Ventricular premature depolarization: Secondary | ICD-10-CM

## 2021-11-28 LAB — ECHOCARDIOGRAM COMPLETE
Area-P 1/2: 2.97 cm2
S' Lateral: 2.5 cm

## 2021-11-30 DIAGNOSIS — M9902 Segmental and somatic dysfunction of thoracic region: Secondary | ICD-10-CM | POA: Diagnosis not present

## 2021-11-30 DIAGNOSIS — M9906 Segmental and somatic dysfunction of lower extremity: Secondary | ICD-10-CM | POA: Diagnosis not present

## 2021-11-30 DIAGNOSIS — M9903 Segmental and somatic dysfunction of lumbar region: Secondary | ICD-10-CM | POA: Diagnosis not present

## 2021-11-30 DIAGNOSIS — M9901 Segmental and somatic dysfunction of cervical region: Secondary | ICD-10-CM | POA: Diagnosis not present

## 2021-12-15 DIAGNOSIS — M9901 Segmental and somatic dysfunction of cervical region: Secondary | ICD-10-CM | POA: Diagnosis not present

## 2021-12-15 DIAGNOSIS — M9902 Segmental and somatic dysfunction of thoracic region: Secondary | ICD-10-CM | POA: Diagnosis not present

## 2021-12-15 DIAGNOSIS — M9906 Segmental and somatic dysfunction of lower extremity: Secondary | ICD-10-CM | POA: Diagnosis not present

## 2021-12-15 DIAGNOSIS — M9903 Segmental and somatic dysfunction of lumbar region: Secondary | ICD-10-CM | POA: Diagnosis not present

## 2021-12-19 ENCOUNTER — Encounter: Payer: Self-pay | Admitting: Cardiovascular Disease

## 2021-12-21 DIAGNOSIS — M9902 Segmental and somatic dysfunction of thoracic region: Secondary | ICD-10-CM | POA: Diagnosis not present

## 2021-12-21 DIAGNOSIS — M9901 Segmental and somatic dysfunction of cervical region: Secondary | ICD-10-CM | POA: Diagnosis not present

## 2021-12-21 DIAGNOSIS — M9903 Segmental and somatic dysfunction of lumbar region: Secondary | ICD-10-CM | POA: Diagnosis not present

## 2021-12-21 DIAGNOSIS — M9906 Segmental and somatic dysfunction of lower extremity: Secondary | ICD-10-CM | POA: Diagnosis not present

## 2021-12-30 DIAGNOSIS — M9902 Segmental and somatic dysfunction of thoracic region: Secondary | ICD-10-CM | POA: Diagnosis not present

## 2021-12-30 DIAGNOSIS — M9903 Segmental and somatic dysfunction of lumbar region: Secondary | ICD-10-CM | POA: Diagnosis not present

## 2021-12-30 DIAGNOSIS — M9906 Segmental and somatic dysfunction of lower extremity: Secondary | ICD-10-CM | POA: Diagnosis not present

## 2021-12-30 DIAGNOSIS — M9901 Segmental and somatic dysfunction of cervical region: Secondary | ICD-10-CM | POA: Diagnosis not present

## 2022-01-02 DIAGNOSIS — M9901 Segmental and somatic dysfunction of cervical region: Secondary | ICD-10-CM | POA: Diagnosis not present

## 2022-01-02 DIAGNOSIS — M9903 Segmental and somatic dysfunction of lumbar region: Secondary | ICD-10-CM | POA: Diagnosis not present

## 2022-01-02 DIAGNOSIS — M9902 Segmental and somatic dysfunction of thoracic region: Secondary | ICD-10-CM | POA: Diagnosis not present

## 2022-01-02 DIAGNOSIS — M9906 Segmental and somatic dysfunction of lower extremity: Secondary | ICD-10-CM | POA: Diagnosis not present

## 2022-01-10 DIAGNOSIS — M9906 Segmental and somatic dysfunction of lower extremity: Secondary | ICD-10-CM | POA: Diagnosis not present

## 2022-01-10 DIAGNOSIS — M9902 Segmental and somatic dysfunction of thoracic region: Secondary | ICD-10-CM | POA: Diagnosis not present

## 2022-01-10 DIAGNOSIS — M9901 Segmental and somatic dysfunction of cervical region: Secondary | ICD-10-CM | POA: Diagnosis not present

## 2022-01-10 DIAGNOSIS — M9903 Segmental and somatic dysfunction of lumbar region: Secondary | ICD-10-CM | POA: Diagnosis not present

## 2022-01-16 ENCOUNTER — Ambulatory Visit: Payer: BC Managed Care – PPO | Admitting: Obstetrics and Gynecology

## 2022-01-16 ENCOUNTER — Encounter: Payer: Self-pay | Admitting: Cardiovascular Disease

## 2022-01-17 MED ORDER — METOPROLOL SUCCINATE ER 25 MG PO TB24: 25.0000 mg | ORAL_TABLET | Freq: Every day | ORAL | 1 refills | Status: DC

## 2022-01-17 NOTE — Progress Notes (Signed)
? ?PCP:  Eilene Ghazi, NP ? ? ?Chief Complaint  ?Patient presents with  ? Gynecologic Exam  ?  Severe cramping during cycles since last visit  ? ? ? ?HPI: ?     [name] is a 46 y.o. No obstetric history on file. who LMP was Patient's last menstrual period was 01/11/2022 (exact date)., presents today for her annual examination.  Her menses are regular every 28-30 days, lasting 5 days, mod flow.  Dysmenorrhea mod to severe at times, worse at night, sometimes in low back and radiating down back of legs; occurring first 1-2 days of flow. NSAIDs/heating pad mildly helpful. Was in tears one night due to pain, no relief with meds. Has to take limited amount of NSAIDs due to GI issues. Dysmen getting worse over past yr. She does not have intermenstrual bleeding.  ? ?Sex activity: rarely sex active--contraception - natural family planning. Trying to be careful. Can't have estrogen due to breast cancer, ER/PR +, Her2 neu -. Pt had failed IUD insertion in past due to cx "wouldn't open up". S/p cryotherapy. Not interested in [facility]. 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 ?Last mammogram: 5/22 at [facility];  Results were: normal--routine follow-up in 12 months at [facility]. S/P RT breast  Intraductal carcinoma with lumpectomy, radiation, and tamoxifen tx but couldn't tolerate tamoxifen. Not on any meds for tx. Pt is MyRisk neg 2017. ? ?There is no FH of breast cancer. There is no FH of ovarian cancer. The patient does self-breast exams. Pt had neg MyRisk testing 2017. There is FH prostate cancer in her dad and pat uncle. Pt has noticed small mass in LT axilla recently, slightly tender.  ? ?Tobacco use: The patient denies current or previous tobacco use. ?Alcohol use: social drinker ?No drug use.  ?Exercise: moderately active ? ?Colonoscopy: 2023 was normal; repeat due after 10 yrs. No FH colon cancer ? ?She does get adequate calcium and sometimes  Vitamin D supp in her diet. ?Currently on amox for tooth infection, wants diflucan Rx ? ?Past Medical History:  ?Diagnosis Date  ? Allergy   ? Anxiety   ? BRCA negative 2017  ? MyRisk neg  ? Breast cancer ([place]) 12/2015  ? ER/PR +, Her2 neu -; neg lymph nodes; s/p lumpectomy, radiation, attempted tamoxifen with side effects  ? Cervical dysplasia   ? Gastrointestinal disorder   ? GERD (gastroesophageal reflux disease)   ? Heart palpitations   ? IBS (irritable bowel syndrome)   ? Ovarian cyst   ? ? ?Past Surgical History:  ?Procedure Laterality Date  ? arm surgery    ? left  ? BREAST LUMPECTOMY WITH RADIOACTIVE SEED AND SENTINEL LYMPH NODE BIOPSY Right 03/01/2016  ? Procedure: RIGHT BREAST LUMPECTOMY WITH RADIOACTIVE SEED AND SENTINEL LYMPH NODE BIOPSY;  Surgeon: Stark Klein, MD;  Location: [facility];  Service: General;  Laterality: Right;  ? COLONOSCOPY    ? CRYOTHERAPY    ? EXCISION MASS LOWER EXTREMETIES Right 03/01/2016  ? Procedure: EXCISION MASS RIGHT THIGH;  Surgeon: Stark Klein, MD;  Location: [facility];  Service: General;  Laterality: Right;  ? MOUTH SURGERY    ? "gum surgery" x 2  ? TONSILLECTOMY    ? WISDOM TOOTH EXTRACTION    ? ? ?Family History  ?Problem Relation Age of Onset  ? Hypertension Mother   ? Hyperlipidemia Mother   ? Hyperlipidemia  Father   ? Prostate cancer Father   ? Hypertension Father   ? Prostate cancer Paternal Uncle   ? Heart failure Maternal Grandmother   ? Heart failure Maternal Grandfather   ? Bone cancer Paternal Grandfather   ? Colon cancer Other   ?     PGGF  ? Esophageal cancer Neg Hx   ? Rectal cancer Neg Hx   ? Stomach cancer Neg Hx   ? ? ?Social History  ? ?Socioeconomic History  ? Marital status: Single  ?  Spouse name: Not on file  ? Number of children: 0  ? Years of education: Not on file  ? Highest education level: Not on file  ?Occupational History  ? Occupation: Secondary school teacher  ?Tobacco Use  ? Smoking status: Never  ? Smokeless tobacco:  Never  ?Vaping Use  ? Vaping Use: Never used  ?Substance and Sexual Activity  ? Alcohol use: Yes  ?  Comment: 0-1 drinks daily  ? Drug use: No  ? Sexual activity: Yes  ?  Birth control/protection: None  ?Other Topics Concern  ? Not on file  ?Social History Narrative  ? Not on file  ? ?Social Determinants of Health  ? ?Financial Resource Strain: Not on file  ?Food Insecurity: Not on file  ?Transportation Needs: Not on file  ?Physical Activity: Not on file  ?Stress: Not on file  ?Social Connections: Not on file  ?Intimate Partner Violence: Not on file  ? ? ?Outpatient Medications Prior to Visit  ?Medication Sig Dispense Refill  ? ALPRAZolam (XANAX) 0.5 MG tablet Take 0.5 mg by mouth daily as needed.    ? amoxicillin (AMOXIL) 875 MG tablet Take 875 mg by mouth 2 (two) times daily.    ? cetirizine HCl (ZYRTEC) 5 MG/5ML SOLN Take 5 mLs (5 mg total) by mouth daily. 300 mL   ? cholecalciferol (VITAMIN D) 25 MCG (1000 UT) tablet Take 1 tablet (1,000 Units total) by mouth daily.    ? loratadine (ALAVERT) 10 MG dissolvable tablet Take 10 mg by mouth daily.    ? Menaquinone-7 (VITAMIN K2 PO) Take 1 capsule by mouth daily.    ? omeprazole (PRILOSEC) 20 MG capsule Take 1 capsule (20 mg total) by mouth daily. 30 capsule 5  ? metoprolol succinate (TOPROL XL) 25 MG 24 hr tablet Take 1 tablet (25 mg total) by mouth daily. (Patient not taking: Reported on 01/18/2022) 90 tablet 1  ? ALREX 0.2 % SUSP Apply 1 drop to eye. 3-4 times daily (Patient not taking: Reported on 10/25/2021)    ? fluticasone (FLONASE) 50 MCG/ACT nasal spray Place 1 spray into both nostrils as needed. (Patient not taking: Reported on 10/25/2021)    ? propranolol (INDERAL) 10 MG tablet Take 10 mg by mouth 2 (two) times daily as needed.    ? ?No facility-administered medications prior to visit.  ? ? ?ROS: ? ?Review of Systems  ?Constitutional:  Negative for fatigue, fever and unexpected weight change.  ?Respiratory:  Negative for cough, shortness of breath and wheezing.    ?Cardiovascular:  Negative for chest pain, palpitations and leg swelling.  ?Gastrointestinal:  Negative for blood in stool, constipation, diarrhea, nausea and vomiting.  ?Endocrine: Negative for cold intolerance, heat intolerance and polyuria.  ?Genitourinary:  Negative for dyspareunia, dysuria, flank pain, frequency, genital sores, hematuria, menstrual problem, pelvic pain, urgency, vaginal bleeding, vaginal discharge and vaginal pain.  ?Musculoskeletal:  Negative for back pain, joint swelling and myalgias.  ?Skin:  Negative for rash.  ?  Neurological:  Negative for dizziness, syncope, light-headedness, numbness and headaches.  ?Hematological:  Negative for adenopathy.  ?Psychiatric/Behavioral:  Negative for agitation, confusion, sleep disturbance and suicidal ideas. The patient is not nervous/anxious.  BREAST: tenderness (where she had radiation tx) ? ? ?Objective: ?BP 100/70   Ht '5\' 2"'  (1.575 m)   Wt 136 lb (61.7 kg)   LMP 01/11/2022 (Exact Date)   BMI 24.87 kg/m?  ? ? ?Physical Exam ?Constitutional:   ?   Appearance: She is well-developed.  ?Genitourinary:  ?   Vulva normal.  ?   Right Labia: No rash, tenderness or lesions. ?   Left Labia: No tenderness, lesions or rash. ?   No vaginal discharge, erythema or tenderness.  ? ?   Right Adnexa: not tender and no mass present. ?   Left Adnexa: not tender and no mass present. ?   No cervical motion tenderness, friability or polyp.  ?   Uterus is not enlarged or tender.  ?Breasts: ?   Right: No mass, nipple discharge, skin change or tenderness.  ?   Left: No mass, nipple discharge, skin change or tenderness.  ?Neck:  ?   Thyroid: No thyromegaly.  ?Cardiovascular:  ?   Rate and Rhythm: Normal rate and regular rhythm.  ?   Heart sounds: Normal heart sounds. No murmur heard. ?Pulmonary:  ?   Effort: Pulmonary effort is normal.  ?   Breath sounds: Normal breath sounds.  ?Chest:  ? ? ?Abdominal:  ?   Palpations: Abdomen is soft.  ?   Tenderness: There is no abdominal  tenderness. There is no guarding or rebound.  ?Musculoskeletal:     ?   General: Normal range of motion.  ?   Cervical back: Normal range of motion.  ?Lymphadenopathy:  ?   Cervical: No cervical adenopathy.

## 2022-01-18 ENCOUNTER — Encounter: Payer: Self-pay | Admitting: Obstetrics and Gynecology

## 2022-01-18 ENCOUNTER — Ambulatory Visit (INDEPENDENT_AMBULATORY_CARE_PROVIDER_SITE_OTHER): Payer: BC Managed Care – PPO | Admitting: Obstetrics and Gynecology

## 2022-01-18 VITALS — BP 100/70 | Ht 62.0 in | Wt 136.0 lb

## 2022-01-18 DIAGNOSIS — N946 Dysmenorrhea, unspecified: Secondary | ICD-10-CM | POA: Diagnosis not present

## 2022-01-18 DIAGNOSIS — Z1231 Encounter for screening mammogram for malignant neoplasm of breast: Secondary | ICD-10-CM

## 2022-01-18 DIAGNOSIS — Z01419 Encounter for gynecological examination (general) (routine) without abnormal findings: Secondary | ICD-10-CM | POA: Diagnosis not present

## 2022-01-18 DIAGNOSIS — C50411 Malignant neoplasm of upper-outer quadrant of right female breast: Secondary | ICD-10-CM

## 2022-01-18 DIAGNOSIS — R2232 Localized swelling, mass and lump, left upper limb: Secondary | ICD-10-CM

## 2022-01-18 DIAGNOSIS — Z170 Estrogen receptor positive status [ER+]: Secondary | ICD-10-CM

## 2022-01-18 MED ORDER — FLUCONAZOLE 150 MG PO TABS: 150.0000 mg | ORAL_TABLET | Freq: Once | ORAL | 0 refills | Status: AC

## 2022-01-18 NOTE — Patient Instructions (Signed)
I value your feedback and you entrusting us with your care. If you get a [facility] patient survey, I would appreciate you taking the time to let us know about your experience today. Thank you! ? ? ?

## 2022-01-19 ENCOUNTER — Ambulatory Visit: Payer: BC Managed Care – PPO | Admitting: Obstetrics and Gynecology

## 2022-01-20 ENCOUNTER — Other Ambulatory Visit: Payer: Self-pay | Admitting: Physician Assistant

## 2022-01-25 DIAGNOSIS — M9903 Segmental and somatic dysfunction of lumbar region: Secondary | ICD-10-CM | POA: Diagnosis not present

## 2022-01-25 DIAGNOSIS — M9902 Segmental and somatic dysfunction of thoracic region: Secondary | ICD-10-CM | POA: Diagnosis not present

## 2022-01-25 DIAGNOSIS — M9901 Segmental and somatic dysfunction of cervical region: Secondary | ICD-10-CM | POA: Diagnosis not present

## 2022-01-25 DIAGNOSIS — M9906 Segmental and somatic dysfunction of lower extremity: Secondary | ICD-10-CM | POA: Diagnosis not present

## 2022-02-09 DIAGNOSIS — M9903 Segmental and somatic dysfunction of lumbar region: Secondary | ICD-10-CM | POA: Diagnosis not present

## 2022-02-09 DIAGNOSIS — M9901 Segmental and somatic dysfunction of cervical region: Secondary | ICD-10-CM | POA: Diagnosis not present

## 2022-02-09 DIAGNOSIS — M9902 Segmental and somatic dysfunction of thoracic region: Secondary | ICD-10-CM | POA: Diagnosis not present

## 2022-02-09 DIAGNOSIS — M9906 Segmental and somatic dysfunction of lower extremity: Secondary | ICD-10-CM | POA: Diagnosis not present

## 2022-02-14 DIAGNOSIS — M9906 Segmental and somatic dysfunction of lower extremity: Secondary | ICD-10-CM | POA: Diagnosis not present

## 2022-02-14 DIAGNOSIS — M9902 Segmental and somatic dysfunction of thoracic region: Secondary | ICD-10-CM | POA: Diagnosis not present

## 2022-02-14 DIAGNOSIS — M9903 Segmental and somatic dysfunction of lumbar region: Secondary | ICD-10-CM | POA: Diagnosis not present

## 2022-02-14 DIAGNOSIS — M9901 Segmental and somatic dysfunction of cervical region: Secondary | ICD-10-CM | POA: Diagnosis not present

## 2022-02-22 DIAGNOSIS — M9901 Segmental and somatic dysfunction of cervical region: Secondary | ICD-10-CM | POA: Diagnosis not present

## 2022-02-22 DIAGNOSIS — M9902 Segmental and somatic dysfunction of thoracic region: Secondary | ICD-10-CM | POA: Diagnosis not present

## 2022-02-22 DIAGNOSIS — M9903 Segmental and somatic dysfunction of lumbar region: Secondary | ICD-10-CM | POA: Diagnosis not present

## 2022-02-22 DIAGNOSIS — M9906 Segmental and somatic dysfunction of lower extremity: Secondary | ICD-10-CM | POA: Diagnosis not present

## 2022-02-27 DIAGNOSIS — Z1231 Encounter for screening mammogram for malignant neoplasm of breast: Secondary | ICD-10-CM | POA: Diagnosis not present

## 2022-02-28 ENCOUNTER — Encounter: Payer: Self-pay | Admitting: Plastic Surgery

## 2022-02-28 ENCOUNTER — Ambulatory Visit: Payer: BC Managed Care – PPO | Admitting: Plastic Surgery

## 2022-02-28 VITALS — BP 126/61 | HR 48 | Ht 62.0 in | Wt 139.6 lb

## 2022-02-28 DIAGNOSIS — N62 Hypertrophy of breast: Secondary | ICD-10-CM

## 2022-02-28 DIAGNOSIS — M545 Low back pain, unspecified: Secondary | ICD-10-CM

## 2022-02-28 DIAGNOSIS — M546 Pain in thoracic spine: Secondary | ICD-10-CM | POA: Diagnosis not present

## 2022-02-28 DIAGNOSIS — Z6825 Body mass index (BMI) 25.0-25.9, adult: Secondary | ICD-10-CM

## 2022-02-28 DIAGNOSIS — M542 Cervicalgia: Secondary | ICD-10-CM | POA: Diagnosis not present

## 2022-02-28 DIAGNOSIS — Z853 Personal history of malignant neoplasm of breast: Secondary | ICD-10-CM

## 2022-02-28 DIAGNOSIS — G8929 Other chronic pain: Secondary | ICD-10-CM

## 2022-02-28 NOTE — Progress Notes (Signed)
Patient ID: Allison Mcclure, female    DOB: [birth year], 46 y.o.   MRN: [id number]   Chief Complaint  Patient presents with   Consult   Breast Problem    The patient is a 46 y.o. female with a history of mammary hyperplasia for several years.  She has extremely large breasts causing symptoms that include the following: Back pain in the upper and lower back, including neck pain. She pulls or pins her bra straps to provide better lift and relief of the pressure and pain. She notices relief by holding her breast up manually.  Her shoulder straps cause grooves and pain and pressure that requires padding for relief. Pain medication is sometimes required with motrin and tylenol.  Activities that are hindered by enlarged breasts include: exercise and running.  She has tried supportive clothing as well as fitted bras without improvement.  Her breasts are extremely large and fairly symmetric.  She has hyperpigmentation of the inframammary area on both sides.  The sternal to nipple distance on the right is 27 cm and the left is 28 cm.  The IMF distance is 12 cm.  She is 5 feet 2 inches tall and weighs 139 pounds.  The BMI = 25.4 kg/m.  Preoperative bra size = DDD cup.  She would like to be a D cup.  The estimated excess breast tissue to be removed at the time of surgery = 340 grams on the left and 340 grams on the right.  Mammogram history: The patient has a history of breast cancer of the right breast from 2017.  She had a mammogram in 2023 with she had radiation.  Tobacco use: None.   The patient expresses the desire to pursue surgical intervention.    Review of Systems  Constitutional: Negative.   Eyes: Negative.   Respiratory: Negative.    Cardiovascular: Negative.   Gastrointestinal: Negative.   Endocrine: Negative.   Genitourinary: Negative.   Musculoskeletal:  Positive for back pain and neck pain.  Skin: Negative.     Past Medical History:  Diagnosis Date   Allergy    Anxiety     BRCA negative 2017   MyRisk neg   Breast cancer ([place]) 12/2015   ER/PR +, Her2 neu -; neg lymph nodes; s/p lumpectomy, radiation, attempted tamoxifen with side effects   Cervical dysplasia    Gastrointestinal disorder    GERD (gastroesophageal reflux disease)    Heart palpitations    IBS (irritable bowel syndrome)    Ovarian cyst     Past Surgical History:  Procedure Laterality Date   arm surgery     left   BREAST LUMPECTOMY WITH RADIOACTIVE SEED AND SENTINEL LYMPH NODE BIOPSY Right 03/01/2016   Procedure: RIGHT BREAST LUMPECTOMY WITH RADIOACTIVE SEED AND SENTINEL LYMPH NODE BIOPSY;  Surgeon: Stark Klein, MD;  Location: [facility];  Service: General;  Laterality: Right;   COLONOSCOPY     CRYOTHERAPY     EXCISION MASS LOWER EXTREMETIES Right 03/01/2016   Procedure: EXCISION MASS RIGHT THIGH;  Surgeon: Stark Klein, MD;  Location: [facility];  Service: General;  Laterality: Right;   MOUTH SURGERY     "gum surgery" x 2   TONSILLECTOMY     WISDOM TOOTH EXTRACTION        Current Outpatient Medications:    cetirizine HCl (ZYRTEC) 5 MG/5ML SOLN, Take 5 mLs (5 mg total) by mouth daily., Disp: 300 mL, Rfl:    cholecalciferol (VITAMIN  D) 25 MCG (1000 UT) tablet, Take 1 tablet (1,000 Units total) by mouth daily., Disp:  , Rfl:    loratadine (ALAVERT) 10 MG dissolvable tablet, Take 10 mg by mouth daily., Disp: , Rfl:    Menaquinone-7 (VITAMIN K2 PO), Take 1 capsule by mouth daily., Disp: , Rfl:    metoprolol succinate (TOPROL XL) 25 MG 24 hr tablet, Take 1 tablet (25 mg total) by mouth daily., Disp: 90 tablet, Rfl: 1   omeprazole (PRILOSEC) 20 MG capsule, TAKE 1 CAPSULE BY MOUTH EVERY DAY, Disp: 90 capsule, Rfl: 1   Objective:   Vitals:   02/28/22 1054  BP: 126/61  Pulse: (!) 48  SpO2: 99%    Physical Exam Vitals reviewed.  Constitutional:      Appearance: Normal appearance.  HENT:     Head: Normocephalic and atraumatic.  Cardiovascular:     Rate  and Rhythm: Normal rate.     Pulses: Normal pulses.  Pulmonary:     Effort: Pulmonary effort is normal.  Abdominal:     Palpations: Abdomen is soft.  Skin:    Capillary Refill: Capillary refill takes less than 2 seconds.     Coloration: Skin is not jaundiced.     Findings: No bruising.  Neurological:     Mental Status: She is alert and oriented to person, place, and time.  Psychiatric:        Mood and Affect: Mood normal.        Behavior: Behavior normal.        Thought Content: Thought content normal.        Judgment: Judgment normal.     Assessment & Plan:  Chronic midline thoracic back pain  Symptomatic mammary hypertrophy  Neck pain  Due to the fact that the patient has had radiation of her right breast I think that it is very risky to do a reduction.  I am concerned that she will have necrosis of part or all of her nipple areolar complex.  She may have some distortion after the surgery as well.  I would like her to think long and hard about it and we can do follow-up virtual visit in about a month.  Pictures were obtained of the patient and placed in the chart with the patient's or guardian's permission.   [place], DO

## 2022-03-02 ENCOUNTER — Encounter: Payer: Self-pay | Admitting: Obstetrics and Gynecology

## 2022-03-02 DIAGNOSIS — M9901 Segmental and somatic dysfunction of cervical region: Secondary | ICD-10-CM | POA: Diagnosis not present

## 2022-03-02 DIAGNOSIS — M9902 Segmental and somatic dysfunction of thoracic region: Secondary | ICD-10-CM | POA: Diagnosis not present

## 2022-03-02 DIAGNOSIS — M9903 Segmental and somatic dysfunction of lumbar region: Secondary | ICD-10-CM | POA: Diagnosis not present

## 2022-03-02 DIAGNOSIS — M9906 Segmental and somatic dysfunction of lower extremity: Secondary | ICD-10-CM | POA: Diagnosis not present

## 2022-03-02 NOTE — Progress Notes (Signed)
Cardiology Office Note:   Date:  03/03/2022  NAME:  Allison Mcclure    MRN: [id number] DOB:  Dec 01, 1975   PCP:  Eilene Ghazi, NP  Cardiologist:  None  Electrophysiologist:  None   Referring MD: Eilene Ghazi, NP   Chief Complaint  Patient presents with   Palpitations   History of Present Illness:   Allison Mcclure is a 46 y.o. female with a hx of PVCs who presents for follow-up.  Still getting daily episodes of palpitations.  Symptoms can be triggered by eating or with alcohol.  She reports symptoms have improved on metoprolol succinate.  She is taking this once a day.  Still getting symptoms daily however.  She is started exercising as well as drinking more water.  Symptoms have improved.  No concerns for sleep apnea.  She denies any chest pain or trouble breathing.  We went over the results of her testing which showed a normal echocardiogram and a 1.7% PVC burden.  She would like to continue with medications for now.  Problem List PVCs -1.7% burden  Past Medical History: Past Medical History:  Diagnosis Date   Allergy    Anxiety    BRCA negative 2017   MyRisk neg   Breast cancer ([place]) 12/2015   ER/PR +, Her2 neu -; neg lymph nodes; s/p lumpectomy, radiation, attempted tamoxifen with side effects   Cervical dysplasia    Gastrointestinal disorder    GERD (gastroesophageal reflux disease)    Heart palpitations    IBS (irritable bowel syndrome)    Ovarian cyst     Past Surgical History: Past Surgical History:  Procedure Laterality Date   arm surgery     left   BREAST LUMPECTOMY WITH RADIOACTIVE SEED AND SENTINEL LYMPH NODE BIOPSY Right 03/01/2016   Procedure: RIGHT BREAST LUMPECTOMY WITH RADIOACTIVE SEED AND SENTINEL LYMPH NODE BIOPSY;  Surgeon: Stark Klein, MD;  Location: [facility];  Service: General;  Laterality: Right;   COLONOSCOPY     CRYOTHERAPY     EXCISION MASS LOWER EXTREMETIES Right 03/01/2016   Procedure: EXCISION MASS RIGHT  THIGH;  Surgeon: Stark Klein, MD;  Location: [facility];  Service: General;  Laterality: Right;   MOUTH SURGERY     "gum surgery" x 2   TONSILLECTOMY     WISDOM TOOTH EXTRACTION      Current Medications: Current Meds  Medication Sig   cetirizine HCl (ZYRTEC) 5 MG/5ML SOLN Take 5 mLs (5 mg total) by mouth daily.   cholecalciferol (VITAMIN D) 25 MCG (1000 UT) tablet Take 1 tablet (1,000 Units total) by mouth daily.   loratadine (ALAVERT) 10 MG dissolvable tablet Take 10 mg by mouth daily.   Menaquinone-7 (VITAMIN K2 PO) Take 1 capsule by mouth daily.   omeprazole (PRILOSEC) 20 MG capsule TAKE 1 CAPSULE BY MOUTH EVERY DAY   [DISCONTINUED] metoprolol succinate (TOPROL XL) 25 MG 24 hr tablet Take 1 tablet (25 mg total) by mouth daily.     Allergies:    Sunscreen spf30 [solbar pf spf15] and Cephalexin   Social History: Social History   Socioeconomic History   Marital status: Single    Spouse name: Not on file   Number of children: 0   Years of education: Not on file   Highest education level: Not on file  Occupational History   Occupation: Secondary school teacher  Tobacco Use   Smoking status: Never   Smokeless tobacco: Never  Vaping Use   Vaping Use: Never  used  Substance and Sexual Activity   Alcohol use: Yes    Comment: 0-1 drinks daily   Drug use: No   Sexual activity: Yes    Birth control/protection: None  Other Topics Concern   Not on file  Social History Narrative   Not on file   Social Determinants of Health   Financial Resource Strain: Not on file  Food Insecurity: Not on file  Transportation Needs: Not on file  Physical Activity: Unknown (12/05/2017)   Exercise Vital Sign    Days of Exercise per Week: 0 days    Minutes of Exercise per Session: Not on file  Stress: Stress Concern Present (12/05/2017)   [place]    Feeling of Stress : To some extent  Social Connections: Unknown  (12/05/2017)   Social Connection and Isolation Panel [NHANES]    Frequency of Communication with Friends and Family: Not on file    Frequency of Social Gatherings with Friends and Family: Not on file    Attends Religious Services: Not on Advertising copywriter or Organizations: Not on file    Attends Archivist Meetings: Not on file    Marital Status: Living with partner     Family History: The patient's family history includes Bone cancer in her paternal grandfather; Colon cancer in an other family member; Heart failure in her maternal grandfather and maternal grandmother; Hyperlipidemia in her father and mother; Hypertension in her father and mother; Prostate cancer in her father and paternal uncle. There is no history of Esophageal cancer, Rectal cancer, or Stomach cancer.  ROS:   All other ROS reviewed and negative. Pertinent positives noted in the HPI.     EKGs/Labs/Other Studies Reviewed:   The following studies were personally reviewed by me today:  TTE 11/28/2021  1. Left ventricular ejection fraction, by estimation, is 70 to 75%. The  left ventricle has hyperdynamic function. The left ventricle has no  regional wall motion abnormalities. Left ventricular diastolic parameters  were normal.   2. Right ventricular systolic function is normal. The right ventricular  size is normal.   3. The mitral valve is grossly normal. Trivial mitral valve  regurgitation. No evidence of mitral stenosis.   4. The aortic valve is normal in structure. Aortic valve regurgitation is  not visualized. No aortic stenosis is present.   5. The inferior vena cava is normal in size with greater than 50%  respiratory variability, suggesting right atrial pressure of 3 mmHg.   [place] 10/25/2021 Impression: 1. No significant arrhythmias detected.  2. PVCs were occasional (1.3% burden). Symptom reported with PVCs.   Recent Labs: No results found for requested labs within last 365 days.    Recent Lipid Panel No results found for: "CHOL", "TRIG", "HDL", "CHOLHDL", "VLDL", "LDLCALC", "LDLDIRECT"  Physical Exam:   VS:  BP 118/62   Pulse (!) 55   Ht 5' 2" (1.575 m)   Wt 138 lb 6.4 oz (62.8 kg)   LMP 02/25/2022 (Exact Date)   SpO2 99%   BMI 25.31 kg/m    Wt Readings from Last 3 Encounters:  03/03/22 138 lb 6.4 oz (62.8 kg)  02/28/22 139 lb 9.6 oz (63.3 kg)  01/18/22 136 lb (61.7 kg)    General: Well nourished, well developed, in no acute distress Head: Atraumatic, normal size  Eyes: PEERLA, EOMI  Neck: Supple, no JVD Endocrine: No thryomegaly Cardiac: Normal S1, S2; RRR; no murmurs,  rubs, or gallops Lungs: Clear to auscultation bilaterally, no wheezing, rhonchi or rales  Abd: Soft, nontender, no hepatomegaly  Ext: No edema, pulses 2+ Musculoskeletal: No deformities, BUE and BLE strength normal and equal Skin: Warm and dry, no rashes   Neuro: Alert and oriented to person, place, time, and situation, CNII-XII grossly intact, no focal deficits  Psych: Normal mood and affect   ASSESSMENT:   Allison Mcclure is a 46 y.o. female who presents for the following: 1. PVC (premature ventricular contraction)     PLAN:   1. PVC (premature ventricular contraction) -1.7% PVC burden.  Still having symptoms of palpitations.  On metoprolol succinate 25 mg daily.  Blood pressure is normal slightly low.  We discussed increasing this to twice per day.  We will see if this helps her.  She will work on continue to exercise as well as hydrate.  She has stopped drinking caffeine.  She also work on alcohol moderation.  Overall symptoms have improved on metoprolol.  We will continue with conservative approach.  Her echo shows normal LV function.  I do not believe she merits rhythm medication or consideration for ablation at this point.  She is simply not having enough PVCs.  She will see me back in 6 months for further assessment.     Disposition: Return in about 6 months (around  09/02/2022).  Medication Adjustments/Labs and Tests Ordered: Current medicines are reviewed at length with the patient today.  Concerns regarding medicines are outlined above.  No orders of the defined types were placed in this encounter.  Meds ordered this encounter  Medications   metoprolol succinate (TOPROL XL) 25 MG 24 hr tablet    Sig: Take 1 tablet (25 mg total) by mouth in the morning and at bedtime.    Dispense:  90 tablet    Refill:  1    Patient Instructions  Medication Instructions:  Increase Metoprolol 25 mg to twice daily   *If you need a refill on your cardiac medications before your next appointment, please call your pharmacy*   Follow-Up: At [facility], you and your health needs are our priority.  As part of our continuing mission to provide you with exceptional heart care, we have created designated Provider Care Teams.  These Care Teams include your primary Cardiologist (physician) and Advanced Practice Providers (APPs -  Physician Assistants and Nurse Practitioners) who all work together to provide you with the care you need, when you need it.  We recommend signing up for the patient portal called "MyChart".  Sign up information is provided on this After Visit Summary.  MyChart is used to connect with patients for Virtual Visits (Telemedicine).  Patients are able to view lab/test results, encounter notes, upcoming appointments, etc.  Non-urgent messages can be sent to your provider as well.   To learn more about what you can do with MyChart, go to [email or web address].    Your next appointment:   6 month(s)  The format for your next appointment:   In Person  Provider:   Eleonore Chiquito, MD              Time Spent with Patient: I have spent a total of 25 minutes with patient reviewing hospital notes, telemetry, EKGs, labs and examining the patient as well as establishing an assessment and plan that was discussed with the patient.  > 50% of time  was spent in direct patient care.  Signed, Addison Naegeli. Audie Box, MD, [place]  [facility]  [address] [place], [place] [address] [phone]  03/03/2022 4:23 PM

## 2022-03-03 ENCOUNTER — Encounter: Payer: Self-pay | Admitting: Cardiovascular Disease

## 2022-03-03 ENCOUNTER — Ambulatory Visit: Payer: BC Managed Care – PPO | Admitting: Cardiovascular Disease

## 2022-03-03 VITALS — BP 118/62 | HR 55 | Ht 62.0 in | Wt 138.4 lb

## 2022-03-03 DIAGNOSIS — I493 Ventricular premature depolarization: Secondary | ICD-10-CM | POA: Diagnosis not present

## 2022-03-03 MED ORDER — METOPROLOL SUCCINATE ER 25 MG PO TB24: 25.0000 mg | ORAL_TABLET | Freq: Two times a day (BID) | ORAL | 1 refills | Status: DC

## 2022-03-03 NOTE — Patient Instructions (Signed)
Medication Instructions:  Increase Metoprolol 25 mg to twice daily   *If you need a refill on your cardiac medications before your next appointment, please call your pharmacy*   Follow-Up: At [facility], you and your health needs are our priority.  As part of our continuing mission to provide you with exceptional heart care, we have created designated Provider Care Teams.  These Care Teams include your primary Cardiologist (physician) and Advanced Practice Providers (APPs -  Physician Assistants and Nurse Practitioners) who all work together to provide you with the care you need, when you need it.  We recommend signing up for the patient portal called "MyChart".  Sign up information is provided on this After Visit Summary.  MyChart is used to connect with patients for Virtual Visits (Telemedicine).  Patients are able to view lab/test results, encounter notes, upcoming appointments, etc.  Non-urgent messages can be sent to your provider as well.   To learn more about what you can do with MyChart, go to [email or web address].    Your next appointment:   6 month(s)  The format for your next appointment:   In Person  Provider:   Eleonore Chiquito, MD

## 2022-03-08 DIAGNOSIS — M9903 Segmental and somatic dysfunction of lumbar region: Secondary | ICD-10-CM | POA: Diagnosis not present

## 2022-03-08 DIAGNOSIS — M9906 Segmental and somatic dysfunction of lower extremity: Secondary | ICD-10-CM | POA: Diagnosis not present

## 2022-03-08 DIAGNOSIS — M9901 Segmental and somatic dysfunction of cervical region: Secondary | ICD-10-CM | POA: Diagnosis not present

## 2022-03-08 DIAGNOSIS — M9902 Segmental and somatic dysfunction of thoracic region: Secondary | ICD-10-CM | POA: Diagnosis not present

## 2022-03-15 DIAGNOSIS — M9903 Segmental and somatic dysfunction of lumbar region: Secondary | ICD-10-CM | POA: Diagnosis not present

## 2022-03-15 DIAGNOSIS — M9901 Segmental and somatic dysfunction of cervical region: Secondary | ICD-10-CM | POA: Diagnosis not present

## 2022-03-15 DIAGNOSIS — M9902 Segmental and somatic dysfunction of thoracic region: Secondary | ICD-10-CM | POA: Diagnosis not present

## 2022-03-15 DIAGNOSIS — M9906 Segmental and somatic dysfunction of lower extremity: Secondary | ICD-10-CM | POA: Diagnosis not present

## 2022-03-29 DIAGNOSIS — R635 Abnormal weight gain: Secondary | ICD-10-CM | POA: Diagnosis not present

## 2022-03-31 ENCOUNTER — Ambulatory Visit (INDEPENDENT_AMBULATORY_CARE_PROVIDER_SITE_OTHER): Payer: BC Managed Care – PPO | Admitting: Plastic Surgery

## 2022-03-31 DIAGNOSIS — Z170 Estrogen receptor positive status [ER+]: Secondary | ICD-10-CM

## 2022-03-31 DIAGNOSIS — Z6825 Body mass index (BMI) 25.0-25.9, adult: Secondary | ICD-10-CM

## 2022-03-31 DIAGNOSIS — N62 Hypertrophy of breast: Secondary | ICD-10-CM

## 2022-03-31 DIAGNOSIS — Z923 Personal history of irradiation: Secondary | ICD-10-CM | POA: Diagnosis not present

## 2022-03-31 DIAGNOSIS — Z853 Personal history of malignant neoplasm of breast: Secondary | ICD-10-CM | POA: Diagnosis not present

## 2022-03-31 NOTE — Progress Notes (Addendum)
   Subjective:    Patient ID: Allison Mcclure, female    DOB: February 19, 1976, 46 y.o.   MRN: [id number]  The patient is a 46 year old female joining me by phone for discussion about her breast reduction.  We met a few weeks ago and talked about the possibility.  She was going to think it over and then decide what she wanted to do.  She has a sternal to nipple distance on the right at 27 cm and 28 cm on the left.  She is 5 feet 2 inches tall and weighs 139 pounds making her BMI is 25.4.  She has DDD cup size and would like to be more of a D cup size.  The challenges that she had right-sided breast cancer in 2017 and received a partial mastectomy with radiation.  This makes it tricky for doing a reduction and her not having wound complications afterwards.    Review of Systems     Objective:   Physical Exam      Assessment & Plan:     ICD-10-CM   1. Symptomatic mammary hypertrophy  N62     2. Malignant neoplasm of upper-outer quadrant of right breast in female, estrogen receptor positive ([place])  C50.411    Z17.0         I connected with  Allison Mcclure on 03/31/22 by a video enabled telemedicine application and verified that I am speaking with the correct person using two identifiers. We spent 5 minutes in discussion. The patient was at home and I was at the office.  We discussed the risks again and the patient would like to give it some time and see if she can lose a little bit of weight.  She is then most welcome to come back for further evaluation.  We could reevaluate and see if maybe just tightening of her skin might help.  Patient seemed to understand and seem to be quite grateful.  We wish her the best and hope to see her again.   I discussed the limitations of evaluation and management by telemedicine. The patient expressed understanding and agreed to proceed.

## 2022-04-03 DIAGNOSIS — M9901 Segmental and somatic dysfunction of cervical region: Secondary | ICD-10-CM | POA: Diagnosis not present

## 2022-04-03 DIAGNOSIS — M9906 Segmental and somatic dysfunction of lower extremity: Secondary | ICD-10-CM | POA: Diagnosis not present

## 2022-04-03 DIAGNOSIS — Z6825 Body mass index (BMI) 25.0-25.9, adult: Secondary | ICD-10-CM | POA: Diagnosis not present

## 2022-04-03 DIAGNOSIS — E559 Vitamin D deficiency, unspecified: Secondary | ICD-10-CM | POA: Diagnosis not present

## 2022-04-03 DIAGNOSIS — Z1339 Encounter for screening examination for other mental health and behavioral disorders: Secondary | ICD-10-CM | POA: Diagnosis not present

## 2022-04-03 DIAGNOSIS — M9902 Segmental and somatic dysfunction of thoracic region: Secondary | ICD-10-CM | POA: Diagnosis not present

## 2022-04-03 DIAGNOSIS — M9903 Segmental and somatic dysfunction of lumbar region: Secondary | ICD-10-CM | POA: Diagnosis not present

## 2022-04-03 DIAGNOSIS — Z1331 Encounter for screening for depression: Secondary | ICD-10-CM | POA: Diagnosis not present

## 2022-04-03 DIAGNOSIS — N946 Dysmenorrhea, unspecified: Secondary | ICD-10-CM | POA: Diagnosis not present

## 2022-04-03 DIAGNOSIS — E7800 Pure hypercholesterolemia, unspecified: Secondary | ICD-10-CM | POA: Diagnosis not present

## 2022-04-04 DIAGNOSIS — Z6825 Body mass index (BMI) 25.0-25.9, adult: Secondary | ICD-10-CM | POA: Diagnosis not present

## 2022-04-04 DIAGNOSIS — E559 Vitamin D deficiency, unspecified: Secondary | ICD-10-CM | POA: Diagnosis not present

## 2022-04-24 DIAGNOSIS — M9902 Segmental and somatic dysfunction of thoracic region: Secondary | ICD-10-CM | POA: Diagnosis not present

## 2022-04-24 DIAGNOSIS — M9906 Segmental and somatic dysfunction of lower extremity: Secondary | ICD-10-CM | POA: Diagnosis not present

## 2022-04-24 DIAGNOSIS — M9901 Segmental and somatic dysfunction of cervical region: Secondary | ICD-10-CM | POA: Diagnosis not present

## 2022-04-24 DIAGNOSIS — M9903 Segmental and somatic dysfunction of lumbar region: Secondary | ICD-10-CM | POA: Diagnosis not present

## 2022-04-27 DIAGNOSIS — M9906 Segmental and somatic dysfunction of lower extremity: Secondary | ICD-10-CM | POA: Diagnosis not present

## 2022-04-27 DIAGNOSIS — M9903 Segmental and somatic dysfunction of lumbar region: Secondary | ICD-10-CM | POA: Diagnosis not present

## 2022-04-27 DIAGNOSIS — M9902 Segmental and somatic dysfunction of thoracic region: Secondary | ICD-10-CM | POA: Diagnosis not present

## 2022-04-27 DIAGNOSIS — E7800 Pure hypercholesterolemia, unspecified: Secondary | ICD-10-CM | POA: Diagnosis not present

## 2022-04-27 DIAGNOSIS — M9901 Segmental and somatic dysfunction of cervical region: Secondary | ICD-10-CM | POA: Diagnosis not present

## 2022-04-27 DIAGNOSIS — Z6825 Body mass index (BMI) 25.0-25.9, adult: Secondary | ICD-10-CM | POA: Diagnosis not present

## 2022-05-01 DIAGNOSIS — M9902 Segmental and somatic dysfunction of thoracic region: Secondary | ICD-10-CM | POA: Diagnosis not present

## 2022-05-01 DIAGNOSIS — M9901 Segmental and somatic dysfunction of cervical region: Secondary | ICD-10-CM | POA: Diagnosis not present

## 2022-05-01 DIAGNOSIS — M9903 Segmental and somatic dysfunction of lumbar region: Secondary | ICD-10-CM | POA: Diagnosis not present

## 2022-05-01 DIAGNOSIS — M9906 Segmental and somatic dysfunction of lower extremity: Secondary | ICD-10-CM | POA: Diagnosis not present

## 2022-05-04 ENCOUNTER — Other Ambulatory Visit: Payer: Self-pay | Admitting: Chiropractic Medicine

## 2022-05-04 ENCOUNTER — Ambulatory Visit
Admission: RE | Admit: 2022-05-04 | Discharge: 2022-05-04 | Disposition: A | Discharge status: Home / Self Care | Payer: BC Managed Care – PPO | Source: Ambulatory Visit | Attending: Chiropractic Medicine | Admitting: Chiropractic Medicine

## 2022-05-04 DIAGNOSIS — G8929 Other chronic pain: Secondary | ICD-10-CM

## 2022-05-04 DIAGNOSIS — Q6589 Other specified congenital deformities of hip: Secondary | ICD-10-CM

## 2022-05-04 DIAGNOSIS — R1013 Epigastric pain: Secondary | ICD-10-CM

## 2022-05-08 DIAGNOSIS — M9901 Segmental and somatic dysfunction of cervical region: Secondary | ICD-10-CM | POA: Diagnosis not present

## 2022-05-08 DIAGNOSIS — M9903 Segmental and somatic dysfunction of lumbar region: Secondary | ICD-10-CM | POA: Diagnosis not present

## 2022-05-08 DIAGNOSIS — M9906 Segmental and somatic dysfunction of lower extremity: Secondary | ICD-10-CM | POA: Diagnosis not present

## 2022-05-08 DIAGNOSIS — M9902 Segmental and somatic dysfunction of thoracic region: Secondary | ICD-10-CM | POA: Diagnosis not present

## 2022-05-15 ENCOUNTER — Encounter: Payer: Self-pay | Admitting: Obstetrics and Gynecology

## 2022-05-15 ENCOUNTER — Telehealth: Payer: Self-pay

## 2022-05-15 DIAGNOSIS — N946 Dysmenorrhea, unspecified: Secondary | ICD-10-CM

## 2022-05-15 NOTE — Telephone Encounter (Signed)
Chart reviewed. No visits found in Epic. Spoke with patient to inquire how she is feeling today. She is better, but upset that she called on Friday morning and left message on nurse line around 9:40. She called back around 1 and was advised by front desk the Clinical staff handling triage was on lunch. She was assured that her message would be responded to upon return. She didn't get a return call and called again at the end of the day when she received the after hours advice nurse. Patient reports she is better, but wants an ultrasound to find out what's going on. (Separate my chart message in progress for u/s scheduling)

## 2022-05-15 NOTE — Telephone Encounter (Signed)
Telephone Advice Record received via fax from [facility]. Caller Chief Complaint: Caller states she had abdominal cramping last night. It lasted for 3 hours. Kept her awake, rated as severe and she almost went to the hospital. She is nervous about going to bed tonight. She is having cramps again today, and is currently on day 2 of menstrual cycle. It feels worse when lying down. Denies fever  Care Advice Given by Access Nurse per Guidline:  See HCP or PCP within 4 hours or go to Urgent Care.

## 2022-05-15 NOTE — Telephone Encounter (Signed)
I sent message to Betha Loa for order to be sent to [facility] in [place].

## 2022-05-15 NOTE — Telephone Encounter (Signed)
Patient advised Allison Mcclure is not in the office today. She will return to the office tomorrow. Separate telephone encounter sent to [facility] regarding patient's current abdominal pain/cramping since Thrusday evening.

## 2022-05-16 DIAGNOSIS — M9901 Segmental and somatic dysfunction of cervical region: Secondary | ICD-10-CM | POA: Diagnosis not present

## 2022-05-16 DIAGNOSIS — M9903 Segmental and somatic dysfunction of lumbar region: Secondary | ICD-10-CM | POA: Diagnosis not present

## 2022-05-16 DIAGNOSIS — M9902 Segmental and somatic dysfunction of thoracic region: Secondary | ICD-10-CM | POA: Diagnosis not present

## 2022-05-16 DIAGNOSIS — M9906 Segmental and somatic dysfunction of lower extremity: Secondary | ICD-10-CM | POA: Diagnosis not present

## 2022-05-16 NOTE — Telephone Encounter (Signed)
Spoke with pt. Having severe dysmen with menses now, extra bad last wk. Was in tears, couldn't get relief with NSAIDs/heating pad. I had already placed GYN u/s order 6/23 due to worsening dysmen sx, pt never had done. Pt to call [facility] to schedule now (since order in system) and will f/u with results. Can't have hormones due to hx of breast cancer. Discussed Rx anaprox and muscle relaxer if u/s neg. If those don't work, can do Rx pain meds sparingly.

## 2022-05-18 ENCOUNTER — Ambulatory Visit
Admission: RE | Admit: 2022-05-18 | Discharge: 2022-05-18 | Disposition: A | Discharge status: Home / Self Care | Payer: BC Managed Care – PPO | Source: Ambulatory Visit | Attending: Obstetrics and Gynecology | Admitting: Obstetrics and Gynecology

## 2022-05-18 DIAGNOSIS — N946 Dysmenorrhea, unspecified: Secondary | ICD-10-CM | POA: Diagnosis not present

## 2022-05-18 MED ORDER — CYCLOBENZAPRINE HCL 10 MG PO TABS: 10.0000 mg | ORAL_TABLET | Freq: Two times a day (BID) | ORAL | 0 refills | Status: DC | PRN

## 2022-05-18 MED ORDER — NAPROXEN 500 MG PO TABS: 500.0000 mg | ORAL_TABLET | Freq: Two times a day (BID) | ORAL | 1 refills | Status: DC

## 2022-05-18 NOTE — Telephone Encounter (Signed)
Pt aware of Gyn u/s results. Not having AUB/menorrhagia, just worsening dysmen. Question leio. Will try Rx naproxen and flexeril for sx. Can't have hormones. F/u prn.

## 2022-05-18 NOTE — Addendum Note (Signed)
Addended by: Ardeth Perfect B on: 0/11/7046 05:08 PM   Modules accepted: Orders

## 2022-05-23 DIAGNOSIS — E559 Vitamin D deficiency, unspecified: Secondary | ICD-10-CM | POA: Diagnosis not present

## 2022-05-23 DIAGNOSIS — Z6825 Body mass index (BMI) 25.0-25.9, adult: Secondary | ICD-10-CM | POA: Diagnosis not present

## 2022-05-31 DIAGNOSIS — M9903 Segmental and somatic dysfunction of lumbar region: Secondary | ICD-10-CM | POA: Diagnosis not present

## 2022-05-31 DIAGNOSIS — M9901 Segmental and somatic dysfunction of cervical region: Secondary | ICD-10-CM | POA: Diagnosis not present

## 2022-05-31 DIAGNOSIS — M9902 Segmental and somatic dysfunction of thoracic region: Secondary | ICD-10-CM | POA: Diagnosis not present

## 2022-05-31 DIAGNOSIS — M9906 Segmental and somatic dysfunction of lower extremity: Secondary | ICD-10-CM | POA: Diagnosis not present

## 2022-06-01 ENCOUNTER — Other Ambulatory Visit: Payer: Self-pay | Admitting: Cardiovascular Disease

## 2022-06-14 DIAGNOSIS — M9901 Segmental and somatic dysfunction of cervical region: Secondary | ICD-10-CM | POA: Diagnosis not present

## 2022-06-14 DIAGNOSIS — M9903 Segmental and somatic dysfunction of lumbar region: Secondary | ICD-10-CM | POA: Diagnosis not present

## 2022-06-14 DIAGNOSIS — M9906 Segmental and somatic dysfunction of lower extremity: Secondary | ICD-10-CM | POA: Diagnosis not present

## 2022-06-14 DIAGNOSIS — M9902 Segmental and somatic dysfunction of thoracic region: Secondary | ICD-10-CM | POA: Diagnosis not present

## 2022-06-20 ENCOUNTER — Telehealth: Payer: BC Managed Care – PPO | Admitting: Nurse Practitioner

## 2022-06-20 DIAGNOSIS — J0140 Acute pansinusitis, unspecified: Secondary | ICD-10-CM

## 2022-06-20 MED ORDER — DOXYCYCLINE HYCLATE 100 MG PO TABS: 100.0000 mg | ORAL_TABLET | Freq: Two times a day (BID) | ORAL | 0 refills | Status: AC

## 2022-06-20 NOTE — Progress Notes (Signed)
E-Visit for Sinus Problems ? ?We are sorry that you are not feeling well.  Here is how we plan to help! ? ?Based on what you have shared with me it looks like you have sinusitis.  Sinusitis is inflammation and infection in the sinus cavities of the head.  Based on your presentation I believe you most likely have Acute Bacterial Sinusitis.  This is an infection caused by bacteria and is treated with antibiotics. I have prescribed Doxycycline 100mg by mouth twice a day for 10 days. You may use an oral decongestant such as Mucinex D or if you have glaucoma or high blood pressure use plain Mucinex. Saline nasal spray help and can safely be used as often as needed for congestion.  If you develop worsening sinus pain, fever or notice severe headache and vision changes, or if symptoms are not better after completion of antibiotic, please schedule an appointment with a health care provider.   ? ?Sinus infections are not as easily transmitted as other respiratory infection, however we still recommend that you avoid close contact with loved ones, especially the very young and elderly.  Remember to wash your hands thoroughly throughout the day as this is the number one way to prevent the spread of infection! ? ?Home Care: ?Only take medications as instructed by your medical team. ?Complete the entire course of an antibiotic. ?Do not take these medications with alcohol. ?A steam or ultrasonic humidifier can help congestion.  You can place a towel over your head and breathe in the steam from hot water coming from a faucet. ?Avoid close contacts especially the very young and the elderly. ?Cover your mouth when you cough or sneeze. ?Always remember to wash your hands. ? ?Get Help Right Away If: ?You develop worsening fever or sinus pain. ?You develop a severe head ache or visual changes. ?Your symptoms persist after you have completed your treatment plan. ? ?Make sure you ?Understand these instructions. ?Will watch your  condition. ?Will get help right away if you are not doing well or get worse. ? ?Thank you for choosing an e-visit. ? ?Your e-visit answers were reviewed by a board certified advanced clinical practitioner to complete your personal care plan. Depending upon the condition, your plan could have included both over the counter or prescription medications. ? ?Please review your pharmacy choice. Make sure the pharmacy is open so you can pick up prescription now. If there is a problem, you may contact your provider through MyChart messaging and have the prescription routed to another pharmacy.  Your safety is important to us. If you have drug allergies check your prescription carefully.  ? ?For the next 24 hours you can use MyChart to ask questions about today's visit, request a non-urgent call back, or ask for a work or school excuse. ?You will get an email in the next two days asking about your experience. I hope that your e-visit has been valuable and will speed your recovery.  ? ?Meds ordered this encounter  ?Medications  ? doxycycline (VIBRA-TABS) 100 MG tablet  ?  Sig: Take 1 tablet (100 mg total) by mouth 2 (two) times daily for 10 days.  ?  Dispense:  20 tablet  ?  Refill:  0  ?  ?I spent approximately 5 minutes reviewing the patient's history, current symptoms and coordinating their plan of care today.   ?

## 2022-06-27 DIAGNOSIS — M9906 Segmental and somatic dysfunction of lower extremity: Secondary | ICD-10-CM | POA: Diagnosis not present

## 2022-06-27 DIAGNOSIS — M9902 Segmental and somatic dysfunction of thoracic region: Secondary | ICD-10-CM | POA: Diagnosis not present

## 2022-06-27 DIAGNOSIS — M9903 Segmental and somatic dysfunction of lumbar region: Secondary | ICD-10-CM | POA: Diagnosis not present

## 2022-06-27 DIAGNOSIS — M9901 Segmental and somatic dysfunction of cervical region: Secondary | ICD-10-CM | POA: Diagnosis not present

## 2022-07-05 DIAGNOSIS — M9902 Segmental and somatic dysfunction of thoracic region: Secondary | ICD-10-CM | POA: Diagnosis not present

## 2022-07-05 DIAGNOSIS — M9906 Segmental and somatic dysfunction of lower extremity: Secondary | ICD-10-CM | POA: Diagnosis not present

## 2022-07-05 DIAGNOSIS — M9901 Segmental and somatic dysfunction of cervical region: Secondary | ICD-10-CM | POA: Diagnosis not present

## 2022-07-05 DIAGNOSIS — M9903 Segmental and somatic dysfunction of lumbar region: Secondary | ICD-10-CM | POA: Diagnosis not present

## 2022-07-19 DIAGNOSIS — M9906 Segmental and somatic dysfunction of lower extremity: Secondary | ICD-10-CM | POA: Diagnosis not present

## 2022-07-19 DIAGNOSIS — M9902 Segmental and somatic dysfunction of thoracic region: Secondary | ICD-10-CM | POA: Diagnosis not present

## 2022-07-19 DIAGNOSIS — M9903 Segmental and somatic dysfunction of lumbar region: Secondary | ICD-10-CM | POA: Diagnosis not present

## 2022-07-19 DIAGNOSIS — M9901 Segmental and somatic dysfunction of cervical region: Secondary | ICD-10-CM | POA: Diagnosis not present

## 2022-08-04 DIAGNOSIS — M9901 Segmental and somatic dysfunction of cervical region: Secondary | ICD-10-CM | POA: Diagnosis not present

## 2022-08-04 DIAGNOSIS — M9903 Segmental and somatic dysfunction of lumbar region: Secondary | ICD-10-CM | POA: Diagnosis not present

## 2022-08-04 DIAGNOSIS — M9906 Segmental and somatic dysfunction of lower extremity: Secondary | ICD-10-CM | POA: Diagnosis not present

## 2022-08-04 DIAGNOSIS — M9902 Segmental and somatic dysfunction of thoracic region: Secondary | ICD-10-CM | POA: Diagnosis not present

## 2022-08-22 ENCOUNTER — Other Ambulatory Visit: Payer: Self-pay | Admitting: Cardiovascular Disease

## 2022-08-22 ENCOUNTER — Other Ambulatory Visit: Payer: Self-pay | Admitting: Chiropractic Medicine

## 2022-08-22 DIAGNOSIS — M9901 Segmental and somatic dysfunction of cervical region: Secondary | ICD-10-CM | POA: Diagnosis not present

## 2022-08-22 DIAGNOSIS — M9906 Segmental and somatic dysfunction of lower extremity: Secondary | ICD-10-CM | POA: Diagnosis not present

## 2022-08-22 DIAGNOSIS — M9902 Segmental and somatic dysfunction of thoracic region: Secondary | ICD-10-CM | POA: Diagnosis not present

## 2022-08-22 DIAGNOSIS — M545 Low back pain, unspecified: Secondary | ICD-10-CM

## 2022-08-22 DIAGNOSIS — M9903 Segmental and somatic dysfunction of lumbar region: Secondary | ICD-10-CM | POA: Diagnosis not present

## 2022-09-01 DIAGNOSIS — M9906 Segmental and somatic dysfunction of lower extremity: Secondary | ICD-10-CM | POA: Diagnosis not present

## 2022-09-01 DIAGNOSIS — M9901 Segmental and somatic dysfunction of cervical region: Secondary | ICD-10-CM | POA: Diagnosis not present

## 2022-09-01 DIAGNOSIS — D485 Neoplasm of uncertain behavior of skin: Secondary | ICD-10-CM | POA: Diagnosis not present

## 2022-09-01 DIAGNOSIS — L820 Inflamed seborrheic keratosis: Secondary | ICD-10-CM | POA: Diagnosis not present

## 2022-09-01 DIAGNOSIS — D224 Melanocytic nevi of scalp and neck: Secondary | ICD-10-CM | POA: Diagnosis not present

## 2022-09-01 DIAGNOSIS — L578 Other skin changes due to chronic exposure to nonionizing radiation: Secondary | ICD-10-CM | POA: Diagnosis not present

## 2022-09-01 DIAGNOSIS — M9903 Segmental and somatic dysfunction of lumbar region: Secondary | ICD-10-CM | POA: Diagnosis not present

## 2022-09-01 DIAGNOSIS — L821 Other seborrheic keratosis: Secondary | ICD-10-CM | POA: Diagnosis not present

## 2022-09-01 DIAGNOSIS — L430 Hypertrophic lichen planus: Secondary | ICD-10-CM | POA: Diagnosis not present

## 2022-09-01 DIAGNOSIS — D2239 Melanocytic nevi of other parts of face: Secondary | ICD-10-CM | POA: Diagnosis not present

## 2022-09-01 DIAGNOSIS — M9902 Segmental and somatic dysfunction of thoracic region: Secondary | ICD-10-CM | POA: Diagnosis not present

## 2022-09-08 DIAGNOSIS — M9903 Segmental and somatic dysfunction of lumbar region: Secondary | ICD-10-CM | POA: Diagnosis not present

## 2022-09-08 DIAGNOSIS — M9906 Segmental and somatic dysfunction of lower extremity: Secondary | ICD-10-CM | POA: Diagnosis not present

## 2022-09-08 DIAGNOSIS — M9902 Segmental and somatic dysfunction of thoracic region: Secondary | ICD-10-CM | POA: Diagnosis not present

## 2022-09-08 DIAGNOSIS — M9901 Segmental and somatic dysfunction of cervical region: Secondary | ICD-10-CM | POA: Diagnosis not present

## 2022-09-14 ENCOUNTER — Ambulatory Visit
Admission: RE | Admit: 2022-09-14 | Discharge: 2022-09-14 | Disposition: A | Discharge status: Home / Self Care | Payer: BC Managed Care – PPO | Source: Ambulatory Visit | Attending: Chiropractic Medicine | Admitting: Chiropractic Medicine

## 2022-09-14 DIAGNOSIS — M5127 Other intervertebral disc displacement, lumbosacral region: Secondary | ICD-10-CM | POA: Diagnosis not present

## 2022-09-14 DIAGNOSIS — G8929 Other chronic pain: Secondary | ICD-10-CM

## 2022-09-15 DIAGNOSIS — M9901 Segmental and somatic dysfunction of cervical region: Secondary | ICD-10-CM | POA: Diagnosis not present

## 2022-09-15 DIAGNOSIS — M9903 Segmental and somatic dysfunction of lumbar region: Secondary | ICD-10-CM | POA: Diagnosis not present

## 2022-09-15 DIAGNOSIS — M9906 Segmental and somatic dysfunction of lower extremity: Secondary | ICD-10-CM | POA: Diagnosis not present

## 2022-09-15 DIAGNOSIS — M9902 Segmental and somatic dysfunction of thoracic region: Secondary | ICD-10-CM | POA: Diagnosis not present

## 2022-09-27 DIAGNOSIS — M9901 Segmental and somatic dysfunction of cervical region: Secondary | ICD-10-CM | POA: Diagnosis not present

## 2022-09-27 DIAGNOSIS — M9906 Segmental and somatic dysfunction of lower extremity: Secondary | ICD-10-CM | POA: Diagnosis not present

## 2022-09-27 DIAGNOSIS — M9902 Segmental and somatic dysfunction of thoracic region: Secondary | ICD-10-CM | POA: Diagnosis not present

## 2022-09-27 DIAGNOSIS — M9903 Segmental and somatic dysfunction of lumbar region: Secondary | ICD-10-CM | POA: Diagnosis not present

## 2022-10-09 DIAGNOSIS — M9902 Segmental and somatic dysfunction of thoracic region: Secondary | ICD-10-CM | POA: Diagnosis not present

## 2022-10-09 DIAGNOSIS — M9906 Segmental and somatic dysfunction of lower extremity: Secondary | ICD-10-CM | POA: Diagnosis not present

## 2022-10-09 DIAGNOSIS — M9901 Segmental and somatic dysfunction of cervical region: Secondary | ICD-10-CM | POA: Diagnosis not present

## 2022-10-09 DIAGNOSIS — M9903 Segmental and somatic dysfunction of lumbar region: Secondary | ICD-10-CM | POA: Diagnosis not present

## 2022-10-20 DIAGNOSIS — M9901 Segmental and somatic dysfunction of cervical region: Secondary | ICD-10-CM | POA: Diagnosis not present

## 2022-10-20 DIAGNOSIS — M9902 Segmental and somatic dysfunction of thoracic region: Secondary | ICD-10-CM | POA: Diagnosis not present

## 2022-10-20 DIAGNOSIS — M9903 Segmental and somatic dysfunction of lumbar region: Secondary | ICD-10-CM | POA: Diagnosis not present

## 2022-10-20 DIAGNOSIS — M9906 Segmental and somatic dysfunction of lower extremity: Secondary | ICD-10-CM | POA: Diagnosis not present

## 2022-11-09 DIAGNOSIS — M9902 Segmental and somatic dysfunction of thoracic region: Secondary | ICD-10-CM | POA: Diagnosis not present

## 2022-11-09 DIAGNOSIS — M9901 Segmental and somatic dysfunction of cervical region: Secondary | ICD-10-CM | POA: Diagnosis not present

## 2022-11-09 DIAGNOSIS — M9906 Segmental and somatic dysfunction of lower extremity: Secondary | ICD-10-CM | POA: Diagnosis not present

## 2022-11-09 DIAGNOSIS — M9903 Segmental and somatic dysfunction of lumbar region: Secondary | ICD-10-CM | POA: Diagnosis not present

## 2022-11-12 NOTE — Progress Notes (Unsigned)
Cardiology Office Note:   Date:  11/13/2022  NAME:  Allison Mcclure    MRN: [id number] DOB:  02-01-1976   PCP:  Eilene Ghazi, NP (Inactive)  Cardiologist:  None  Electrophysiologist:  None   Referring MD: No ref. provider found   Chief Complaint  Patient presents with   Follow-up        History of Present Illness:   Allison Mcclure is a 47 y.o. female with a hx of PVCs who presents for follow-up.  She reports history of having episodes of palpitations at night.  They can last several minutes.  They can occur at night or with alcohol.  She describes fluttering and flip-flopping in her chest.  She reports no chest pain or trouble breathing.  She is going to the gym.  She is walking on a treadmill for 30 minutes.  She is doing Pilates.  Does not have PVCs when this happens.  Blood pressure is stable.  No dizziness or lightheadedness.  We discussed increasing her metoprolol.  She is okay to do this.  We discussed that these are benign.  CV examination normal.  Problem List PVCs -1.3% burden  Past Medical History: Past Medical History:  Diagnosis Date   Allergy    Anxiety    BRCA negative 2017   MyRisk neg   Breast cancer ([place]) 12/2015   ER/PR +, Her2 neu -; neg lymph nodes; s/p lumpectomy, radiation, attempted tamoxifen with side effects   Cervical dysplasia    Gastrointestinal disorder    GERD (gastroesophageal reflux disease)    Heart palpitations    IBS (irritable bowel syndrome)    Ovarian cyst     Past Surgical History: Past Surgical History:  Procedure Laterality Date   arm surgery     left   BREAST LUMPECTOMY WITH RADIOACTIVE SEED AND SENTINEL LYMPH NODE BIOPSY Right 03/01/2016   Procedure: RIGHT BREAST LUMPECTOMY WITH RADIOACTIVE SEED AND SENTINEL LYMPH NODE BIOPSY;  Surgeon: Stark Klein, MD;  Location: [facility];  Service: General;  Laterality: Right;   COLONOSCOPY     CRYOTHERAPY     EXCISION MASS LOWER EXTREMETIES Right 03/01/2016    Procedure: EXCISION MASS RIGHT THIGH;  Surgeon: Stark Klein, MD;  Location: [facility];  Service: General;  Laterality: Right;   MOUTH SURGERY     "gum surgery" x 2   TONSILLECTOMY     WISDOM TOOTH EXTRACTION      Current Medications: Current Meds  Medication Sig   cetirizine HCl (ZYRTEC) 5 MG/5ML SOLN Take 5 mLs (5 mg total) by mouth daily.   cholecalciferol (VITAMIN D) 25 MCG (1000 UT) tablet Take 1 tablet (1,000 Units total) by mouth daily.   loratadine (ALAVERT) 10 MG dissolvable tablet Take 10 mg by mouth daily.   Menaquinone-7 (VITAMIN K2 PO) Take 1 capsule by mouth daily.   naproxen (NAPROSYN) 500 MG tablet Take 1 tablet (500 mg total) by mouth 2 (two) times daily with a meal. As needed for pain   omeprazole (PRILOSEC) 20 MG capsule TAKE 1 CAPSULE BY MOUTH EVERY DAY   [DISCONTINUED] cyclobenzaprine (FLEXERIL) 10 MG tablet Take 1 tablet (10 mg total) by mouth 2 (two) times daily as needed for muscle spasms.   [DISCONTINUED] metoprolol succinate (TOPROL-XL) 25 MG 24 hr tablet TAKE 1 TABLET (25 MG TOTAL) BY MOUTH DAILY.     Allergies:    Sunscreen spf30 [solbar pf spf15] and Cephalexin   Social History: Social History  Socioeconomic History   Marital status: Single    Spouse name: Not on file   Number of children: 0   Years of education: Not on file   Highest education level: Not on file  Occupational History   Occupation: Secondary school teacher  Tobacco Use   Smoking status: Never   Smokeless tobacco: Never  Vaping Use   Vaping Use: Never used  Substance and Sexual Activity   Alcohol use: Yes    Comment: 0-1 drinks daily   Drug use: No   Sexual activity: Yes    Birth control/protection: None  Other Topics Concern   Not on file  Social History Narrative   Not on file   Social Determinants of Health   Financial Resource Strain: Not on file  Food Insecurity: Not on file  Transportation Needs: Not on file  Physical Activity: Unknown (12/05/2017)    Exercise Vital Sign    Days of Exercise per Week: 0 days    Minutes of Exercise per Session: Not on file  Stress: Stress Concern Present (12/05/2017)   [place]    Feeling of Stress : To some extent  Social Connections: Unknown (12/05/2017)   Social Connection and Isolation Panel [NHANES]    Frequency of Communication with Friends and Family: Not on file    Frequency of Social Gatherings with Friends and Family: Not on file    Attends Religious Services: Not on Advertising copywriter or Organizations: Not on file    Attends Archivist Meetings: Not on file    Marital Status: Living with partner     Family History: The patient's family history includes Bone cancer in her paternal grandfather; Colon cancer in an other family member; Heart failure in her maternal grandfather and maternal grandmother; Hyperlipidemia in her father and mother; Hypertension in her father and mother; Prostate cancer in her father and paternal uncle. There is no history of Esophageal cancer, Rectal cancer, or Stomach cancer.  ROS:   All other ROS reviewed and negative. Pertinent positives noted in the HPI.     EKGs/Labs/Other Studies Reviewed:   The following studies were personally reviewed by me today:  [facility] 11/15/2021  Impression: 1. No significant arrhythmias detected.  2. PVCs were occasional (1.3% burden). Symptom reported with PVCs.   TTE 11/28/2021  1. Left ventricular ejection fraction, by estimation, is 70 to 75%. The  left ventricle has hyperdynamic function. The left ventricle has no  regional wall motion abnormalities. Left ventricular diastolic parameters  were normal.   2. Right ventricular systolic function is normal. The right ventricular  size is normal.   3. The mitral valve is grossly normal. Trivial mitral valve  regurgitation. No evidence of mitral stenosis.   4. The aortic valve is normal in  structure. Aortic valve regurgitation is  not visualized. No aortic stenosis is present.   5. The inferior vena cava is normal in size with greater than 50%  respiratory variability, suggesting right atrial pressure of 3 mmHg.     Recent Labs: No results found for requested labs within last 365 days.   Recent Lipid Panel No results found for: "CHOL", "TRIG", "HDL", "CHOLHDL", "VLDL", "LDLCALC", "LDLDIRECT"  Physical Exam:   VS:  BP 112/72   Pulse 78   Ht '5\' 2"'$  (1.575 m)   Wt 142 lb (64.4 kg)   SpO2 99%   BMI 25.97 kg/m    Wt Readings from  Last 3 Encounters:  11/13/22 142 lb (64.4 kg)  03/03/22 138 lb 6.4 oz (62.8 kg)  02/28/22 139 lb 9.6 oz (63.3 kg)    General: Well nourished, well developed, in no acute distress Head: Atraumatic, normal size  Eyes: PEERLA, EOMI  Neck: Supple, no JVD Endocrine: No thryomegaly Cardiac: Normal S1, S2; RRR; no murmurs, rubs, or gallops Lungs: Clear to auscultation bilaterally, no wheezing, rhonchi or rales  Abd: Soft, nontender, no hepatomegaly  Ext: No edema, pulses 2+ Musculoskeletal: No deformities, BUE and BLE strength normal and equal Skin: Warm and dry, no rashes   Neuro: Alert and oriented to person, place, time, and situation, CNII-XII grossly intact, no focal deficits  Psych: Normal mood and affect   ASSESSMENT:   Allison Mcclure is a 47 y.o. female who presents for the following: 1. PVC (premature ventricular contraction)   2. Palpitations     PLAN:    1. PVC (premature ventricular contraction) 2. Palpitations -PVCs.  1.3% burden.  Normal LV function.  Still having symptoms nightly.  We discussed increasing her metoprolol succinate to 25 mg twice daily.  This may improve the episodes.  She is exercising more she is working on diet.  She will consume alcohol moderation.  We also discussed proper sleep.  She will see [place] back in 6 months to discuss further.  Disposition: Return in about 6 months (around  05/14/2023).  Medication Adjustments/Labs and Tests Ordered: Current medicines are reviewed at length with the patient today.  Concerns regarding medicines are outlined above.  No orders of the defined types were placed in this encounter.  Meds ordered this encounter  Medications   metoprolol succinate (TOPROL-XL) 25 MG 24 hr tablet    Sig: Take 1 tablet (25 mg total) by mouth 2 (two) times daily.    Dispense:  180 tablet    Refill:  3    Patient Instructions  Medication Instructions:  INCREASE Metoprolol Succinate twice daily   *If you need a refill on your cardiac medications before your next appointment, please call your pharmacy*  Follow-Up: At [facility], you and your health needs are our priority.  As part of our continuing mission to provide you with exceptional heart care, we have created designated Provider Care Teams.  These Care Teams include your primary Cardiologist (physician) and Advanced Practice Providers (APPs -  Physician Assistants and Nurse Practitioners) who all work together to provide you with the care you need, when you need it.  We recommend signing up for the patient portal called "MyChart".  Sign up information is provided on this After Visit Summary.  MyChart is used to connect with patients for Virtual Visits (Telemedicine).  Patients are able to view lab/test results, encounter notes, upcoming appointments, etc.  Non-urgent messages can be sent to your provider as well.   To learn more about what you can do with MyChart, go to [email or web address].    Your next appointment:   6 month(s)  Provider:   Eleonore Chiquito, MD     Time Spent with Patient: I have spent a total of 25 minutes with patient reviewing hospital notes, telemetry, EKGs, labs and examining the patient as well as establishing an assessment and plan that was discussed with the patient.  > 50% of time was spent in direct patient care.  Signed, Addison Naegeli. Audie Box, MD, [place]  [address] [place], [place] [address] [phone]  11/13/2022 10:47 AM

## 2022-11-13 ENCOUNTER — Encounter: Payer: Self-pay | Admitting: Cardiovascular Disease

## 2022-11-13 ENCOUNTER — Ambulatory Visit: Payer: BC Managed Care – PPO | Attending: Cardiovascular Disease | Admitting: Cardiovascular Disease

## 2022-11-13 VITALS — BP 112/72 | HR 78 | Ht 62.0 in | Wt 142.0 lb

## 2022-11-13 DIAGNOSIS — R002 Palpitations: Secondary | ICD-10-CM

## 2022-11-13 DIAGNOSIS — I493 Ventricular premature depolarization: Secondary | ICD-10-CM

## 2022-11-13 MED ORDER — METOPROLOL SUCCINATE ER 25 MG PO TB24: 25.0000 mg | ORAL_TABLET | Freq: Two times a day (BID) | ORAL | 3 refills | Status: DC

## 2022-11-13 NOTE — Patient Instructions (Signed)
Medication Instructions:  INCREASE Metoprolol Succinate twice daily   *If you need a refill on your cardiac medications before your next appointment, please call your pharmacy*  Follow-Up: At [facility], you and your health needs are our priority.  As part of our continuing mission to provide you with exceptional heart care, we have created designated Provider Care Teams.  These Care Teams include your primary Cardiologist (physician) and Advanced Practice Providers (APPs -  Physician Assistants and Nurse Practitioners) who all work together to provide you with the care you need, when you need it.  We recommend signing up for the patient portal called "MyChart".  Sign up information is provided on this After Visit Summary.  MyChart is used to connect with patients for Virtual Visits (Telemedicine).  Patients are able to view lab/test results, encounter notes, upcoming appointments, etc.  Non-urgent messages can be sent to your provider as well.   To learn more about what you can do with MyChart, go to [email or web address].    Your next appointment:   6 month(s)  Provider:   Eleonore Chiquito, MD

## 2022-11-23 DIAGNOSIS — M9901 Segmental and somatic dysfunction of cervical region: Secondary | ICD-10-CM | POA: Diagnosis not present

## 2022-11-23 DIAGNOSIS — M9903 Segmental and somatic dysfunction of lumbar region: Secondary | ICD-10-CM | POA: Diagnosis not present

## 2022-11-23 DIAGNOSIS — M9902 Segmental and somatic dysfunction of thoracic region: Secondary | ICD-10-CM | POA: Diagnosis not present

## 2022-11-23 DIAGNOSIS — M9906 Segmental and somatic dysfunction of lower extremity: Secondary | ICD-10-CM | POA: Diagnosis not present

## 2022-12-01 ENCOUNTER — Other Ambulatory Visit: Payer: Self-pay | Admitting: Physician Assistant

## 2022-12-01 MED ORDER — OMEPRAZOLE 20 MG PO CPDR: 20.0000 mg | DELAYED_RELEASE_CAPSULE | Freq: Every day | ORAL | 0 refills | Status: DC

## 2022-12-19 DIAGNOSIS — M9901 Segmental and somatic dysfunction of cervical region: Secondary | ICD-10-CM | POA: Diagnosis not present

## 2022-12-19 DIAGNOSIS — M9902 Segmental and somatic dysfunction of thoracic region: Secondary | ICD-10-CM | POA: Diagnosis not present

## 2022-12-19 DIAGNOSIS — M9903 Segmental and somatic dysfunction of lumbar region: Secondary | ICD-10-CM | POA: Diagnosis not present

## 2022-12-19 DIAGNOSIS — M9906 Segmental and somatic dysfunction of lower extremity: Secondary | ICD-10-CM | POA: Diagnosis not present

## 2022-12-20 DIAGNOSIS — J069 Acute upper respiratory infection, unspecified: Secondary | ICD-10-CM | POA: Diagnosis not present

## 2022-12-21 DIAGNOSIS — H5203 Hypermetropia, bilateral: Secondary | ICD-10-CM | POA: Diagnosis not present

## 2023-01-05 DIAGNOSIS — M9903 Segmental and somatic dysfunction of lumbar region: Secondary | ICD-10-CM | POA: Diagnosis not present

## 2023-01-05 DIAGNOSIS — M9901 Segmental and somatic dysfunction of cervical region: Secondary | ICD-10-CM | POA: Diagnosis not present

## 2023-01-05 DIAGNOSIS — M9902 Segmental and somatic dysfunction of thoracic region: Secondary | ICD-10-CM | POA: Diagnosis not present

## 2023-01-05 DIAGNOSIS — M9906 Segmental and somatic dysfunction of lower extremity: Secondary | ICD-10-CM | POA: Diagnosis not present

## 2023-01-12 ENCOUNTER — Encounter: Payer: Self-pay | Admitting: Physician Assistant

## 2023-01-12 ENCOUNTER — Ambulatory Visit: Payer: BC Managed Care – PPO | Admitting: Physician Assistant

## 2023-01-12 VITALS — BP 110/70 | HR 65 | Ht 62.0 in | Wt 138.0 lb

## 2023-01-12 DIAGNOSIS — R1013 Epigastric pain: Secondary | ICD-10-CM | POA: Diagnosis not present

## 2023-01-12 DIAGNOSIS — K2930 Chronic superficial gastritis without bleeding: Secondary | ICD-10-CM | POA: Diagnosis not present

## 2023-01-12 MED ORDER — OMEPRAZOLE 20 MG PO CPDR: 20.0000 mg | DELAYED_RELEASE_CAPSULE | Freq: Two times a day (BID) | ORAL | 11 refills | Status: DC

## 2023-01-12 NOTE — Progress Notes (Signed)
Addendum: Reviewed and agree with assessment and management plan. Jahmya Onofrio M, MD  

## 2023-01-12 NOTE — Patient Instructions (Addendum)
_______________________________________________________  If your blood pressure at your visit was 140/90 or greater, please contact your primary care physician to follow up on this.  If you are age 48 or younger, your body mass index should be between 19-25. Your Body mass index is 25.24 kg/m. If this is out of the aformentioned range listed, please consider follow up with your Primary Care Provider.  ________________________________________________________  The [facility] providers would like to encourage you to use [facility] to communicate with providers for non-urgent requests or questions.  Due to long hold times on the telephone, sending your provider a message by [facility] may be a faster and more efficient way to get a response.  Please allow 48 business hours for a response.  Please remember that this is for non-urgent requests.  _______________________________________________________  We have sent the following medications to your pharmacy for you to pick up at your convenience:  INCREASE: omeprazole 40mg  one capsule twice daily.  You are scheduled to follow up on on   Thank you for entrusting me with your care and choosing [facility].  Hyacinth Meeker, PA-C

## 2023-01-12 NOTE — Progress Notes (Signed)
Chief Complaint: Medication refill  HPI:    Allison Mcclure is a 47 year old female with a past medical history as listed below including breast cancer, GERD and IBS, known to Dr. Rhea Belton, who resents to clinic today for a refill of her medication.    10/10/2021 EGD and colonoscopy.  Colonoscopy was normal.  Repeat recommended in 10 years.  EGD with 2 cm hiatal hernia and otherwise normal.  Patient continued on Omeprazole 20 mg daily.  Biopsies with mild reactive changes.    Today, the patient tells me that since March she feels like she has had a little bit more epigastric pain right between her breasts and also into her right upper quadrant which are the same symptoms she had when she had gastritis and it was bothering her.  She is maintained on Omeprazole 20 mg daily but recently has sometimes had to take 2 of these and a Tums in order to feel better.  Just yesterday she had an episode relieved by increasing her medicine like this.  Sometimes putting her bra on also helps at least pull the pressure away from this area.  Otherwise asked about long-term side effects from PPIs.    Denies fever, chills, weight loss, nausea, vomiting or symptoms that awaken her from sleep.  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 +, Her2 neu -; neg lymph nodes; s/p lumpectomy, radiation, attempted tamoxifen with side effects   Cervical dysplasia    Gastrointestinal disorder    GERD (gastroesophageal reflux disease)    Heart palpitations    IBS (irritable bowel syndrome)    Ovarian cyst     Past Surgical History:  Procedure Laterality Date   arm surgery     left   BREAST LUMPECTOMY WITH RADIOACTIVE SEED AND SENTINEL LYMPH NODE BIOPSY Right 03/01/2016   Procedure: RIGHT BREAST LUMPECTOMY WITH RADIOACTIVE SEED AND SENTINEL LYMPH NODE BIOPSY;  Surgeon: Almond Lint, MD;  Location: [facility];  Service:  General;  Laterality: Right;   COLONOSCOPY     CRYOTHERAPY     EXCISION MASS LOWER EXTREMETIES Right 03/01/2016   Procedure: EXCISION MASS RIGHT THIGH;  Surgeon: Almond Lint, MD;  Location: [facility];  Service: General;  Laterality: Right;   MOUTH SURGERY     "gum surgery" x 2   TONSILLECTOMY     WISDOM TOOTH EXTRACTION      Current Outpatient Medications  Medication Sig Dispense Refill   cetirizine HCl (ZYRTEC) 5 MG/5ML SOLN Take 5 mLs (5 mg total) by mouth daily. 300 mL    cholecalciferol (VITAMIN D) 25 MCG (1000 UT) tablet Take 1 tablet (1,000 Units total) by mouth daily.     loratadine (ALAVERT) 10 MG dissolvable tablet Take 10 mg by mouth daily.     Menaquinone-7 (VITAMIN K2 PO) Take 1 capsule by mouth daily.     metoprolol succinate (TOPROL-XL) 25 MG 24 hr tablet Take 1 tablet (25 mg total) by mouth 2 (two) times daily. 180 tablet 3   naproxen (NAPROSYN) 500 MG tablet Take 1 tablet (500 mg total) by mouth 2 (two) times daily with a meal. As needed for pain 30 tablet 1   omeprazole (PRILOSEC) 20 MG capsule Take 1 capsule (20 mg total) by mouth daily. 30 capsule 0   No current facility-administered medications for this visit.  Allergies as of 01/12/2023 - Review Complete 11/13/2022  Allergen Reaction Noted   Sunscreen spf30 [solbar pf spf15] Hives, Itching, and Rash 08/21/2016   Cephalexin Rash and Other (See Comments) 10/30/2016    Family History  Problem Relation Age of Onset   Hypertension Mother    Hyperlipidemia Mother    Hyperlipidemia Father    Prostate cancer Father    Hypertension Father    Prostate cancer Paternal Uncle    Heart failure Maternal Grandmother    Heart failure Maternal Grandfather    Bone cancer Paternal Grandfather    Colon cancer Other        PGGF   Esophageal cancer Neg Hx    Rectal cancer Neg Hx    Stomach cancer Neg Hx     Social History   Socioeconomic History   Marital status: Single    Spouse name: Not on file    Number of children: 0   Years of education: Not on file   Highest education level: Not on file  Occupational History   Occupation: Investment banker, corporate  Tobacco Use   Smoking status: Never   Smokeless tobacco: Never  Vaping Use   Vaping Use: Never used  Substance and Sexual Activity   Alcohol use: Yes    Comment: 0-1 drinks daily   Drug use: No   Sexual activity: Yes    Birth control/protection: None  Other Topics Concern   Not on file  Social History Narrative   Not on file   Social Determinants of Health   Financial Resource Strain: Not on file  Food Insecurity: Not on file  Transportation Needs: Not on file  Physical Activity: Unknown (12/05/2017)   Exercise Vital Sign    Days of Exercise per Week: 0 days    Minutes of Exercise per Session: Not on file  Stress: Stress Concern Present (12/05/2017)   [employer] of Occupational Health - Occupational Stress Questionnaire    Feeling of Stress : To some extent  Social Connections: Unknown (12/05/2017)   Social Connection and Isolation Panel [NHANES]    Frequency of Communication with Friends and Family: Not on file    Frequency of Social Gatherings with Friends and Family: Not on file    Attends Religious Services: Not on file    Active Member of Clubs or Organizations: Not on file    Attends Banker Meetings: Not on file    Marital Status: Living with partner  Intimate Partner Violence: Not At Risk (12/05/2017)   Humiliation, Afraid, Rape, and Kick questionnaire    Fear of Current or Ex-Partner: No    Emotionally Abused: No    Physically Abused: No    Sexually Abused: No    Review of Systems:    Constitutional: No weight loss, fever or chills Cardiovascular: No chest pain Respiratory: No SOB Gastrointestinal: See HPI and otherwise negative   Physical Exam:  Vital signs: BP 110/70   Pulse 65   Ht 5\' 2"  (1.575 m)   Wt 138 lb (62.6 kg)   BMI 25.24 kg/m    Constitutional:   Pleasant Caucasian  female appears to be in NAD, Well developed, Well nourished, alert and cooperative Respiratory: Respirations even and unlabored. Lungs clear to auscultation bilaterally.   No wheezes, crackles, or rhonchi.  Cardiovascular: Normal S1, S2. No MRG. Regular rate and rhythm. No peripheral edema, cyanosis or pallor.  Gastrointestinal:  Soft, nondistended, epigastric pain, No rebound or guarding. Normal bowel sounds. No appreciable masses or  hepatomegaly. Rectal:  Not performed.  Psychiatric: Oriented to person, place and time. Demonstrates good judgement and reason without abnormal affect or behaviors.  No recent labs.  Assessment: 1.  Gastritis/hiatal hernia: Seen at time of recent EGD in January of last year, recently with some increases in epigastric pain; likely gastritis +/- musculoskeletal overlap with pendulous breasts 2.  Epigastric pain  Plan: 1.  Discussed with patient that we should increase her Omeprazole to 20 mg twice daily for a little while, 30-60 minutes before breakfast and again before dinner.  Prescribed #60 with 5 refills. 2.  After about 1 to 2 months if she is feeling better from her current flare then she can decrease back to once daily. 3.  We did discuss side effects from long-term use of PPIs and I answered all of her questions. 4.  Did discuss there is likely musculoskeletal overlap as she feels also better when wearing a bra and feeling supported. 5.  Patient to follow in clinic with me in 3 months or sooner if necessary.  Hyacinth Meeker, PA-C [facility] 01/12/2023, 10:08 AM

## 2023-01-17 DIAGNOSIS — M9906 Segmental and somatic dysfunction of lower extremity: Secondary | ICD-10-CM | POA: Diagnosis not present

## 2023-01-17 DIAGNOSIS — M9901 Segmental and somatic dysfunction of cervical region: Secondary | ICD-10-CM | POA: Diagnosis not present

## 2023-01-17 DIAGNOSIS — M9903 Segmental and somatic dysfunction of lumbar region: Secondary | ICD-10-CM | POA: Diagnosis not present

## 2023-01-17 DIAGNOSIS — M9902 Segmental and somatic dysfunction of thoracic region: Secondary | ICD-10-CM | POA: Diagnosis not present

## 2023-01-29 NOTE — Progress Notes (Unsigned)
PCP:  Dennie Maizes, NP (Inactive)   No chief complaint on file.    HPI:      [name] is a 48 y.o. No obstetric history on file. who LMP was No LMP recorded., presents today for her annual examination.  Her menses are regular every 28-30 days, lasting 5 days, mod flow.  Dysmenorrhea mod to severe at times, worse at night, sometimes in low back and radiating down back of legs; occurring first 1-2 days of flow. NSAIDs/heating pad mildly helpful. Was in tears one night due to pain, no relief with meds. Has to take limited amount of NSAIDs due to GI issues. Dysmen getting worse over past yr. She does not have intermenstrual bleeding. Had GYN u/s 8/23 for worsening pain; given Rx anaprox and flexeril. ? Small leio, ? 4 mm echogenic mass??  Sex activity: rarely sex active--contraception - natural family planning. Trying to be careful. Can't have estrogen due to breast cancer, ER/PR +, Her2 neu -. Pt had failed IUD insertion in past due to cx "wouldn't open up". S/p cryotherapy. Not interested in [facility]. 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 S/P RT breast  Intraductal carcinoma with lumpectomy, radiation, and tamoxifen tx but couldn't tolerate tamoxifen. Not on any meds for tx. Pt is MyRisk neg 2017.  There is no FH of breast cancer. There is no FH of ovarian cancer. The patient does self-breast exams. Pt had neg MyRisk testing 2017. There is FH prostate cancer in her dad and pat uncle. Pt has noticed small mass in LT axilla recently, slightly tender.   Tobacco use: The patient denies current or previous tobacco use. Alcohol use: social drinker No drug use.  Exercise: moderately active  Colonoscopy: 2023 was normal; repeat due after 10 yrs. No FH colon cancer  She does get adequate calcium and sometimes Vitamin D  supp in her diet. 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 +, Her2 neu -; neg lymph nodes; s/p lumpectomy, radiation, attempted tamoxifen with side effects   Cervical dysplasia    Gastrointestinal disorder    GERD (gastroesophageal reflux disease)    Heart palpitations    IBS (irritable bowel syndrome)    Ovarian cyst     Past Surgical History:  Procedure Laterality Date   arm surgery     left   BREAST LUMPECTOMY WITH RADIOACTIVE SEED AND SENTINEL LYMPH NODE BIOPSY Right 03/01/2016   Procedure: RIGHT BREAST LUMPECTOMY WITH RADIOACTIVE SEED AND SENTINEL LYMPH NODE BIOPSY;  Surgeon: Almond Lint, MD;  Location: [facility];  Service: General;  Laterality: Right;   COLONOSCOPY     CRYOTHERAPY     EXCISION MASS LOWER EXTREMETIES Right 03/01/2016   Procedure: EXCISION MASS RIGHT THIGH;  Surgeon: Almond Lint, MD;  Location: [facility];  Service: General;  Laterality: Right;   MOUTH SURGERY     "gum surgery" x 2   TONSILLECTOMY     WISDOM TOOTH EXTRACTION      Family History  Problem Relation Age of Onset   Hypertension Mother    Hyperlipidemia Mother    Hyperlipidemia Father  Prostate cancer Father    Hypertension Father    Prostate cancer Paternal Uncle    Heart failure Maternal Grandmother    Heart failure Maternal Grandfather    Bone cancer Paternal Grandfather    Colon cancer Other        PGGF   Esophageal cancer Neg Hx    Rectal cancer Neg Hx    Stomach cancer Neg Hx     Social History   Socioeconomic History   Marital status: Single    Spouse name: Not on file   Number of children: 0   Years of education: Not on file   Highest education level: Not on file  Occupational History   Occupation: Investment banker, corporate  Tobacco Use   Smoking status: Never   Smokeless tobacco: Never   Vaping Use   Vaping Use: Never used  Substance and Sexual Activity   Alcohol use: Yes    Comment: 0-1 drinks daily   Drug use: No   Sexual activity: Yes    Birth control/protection: None  Other Topics Concern   Not on file  Social History Narrative   Not on file   Social Determinants of Health   Financial Resource Strain: Not on file  Food Insecurity: Not on file  Transportation Needs: Not on file  Physical Activity: Unknown (12/05/2017)   Exercise Vital Sign    Days of Exercise per Week: 0 days    Minutes of Exercise per Session: Not on file  Stress: Stress Concern Present (12/05/2017)   [employer] of Occupational Health - Occupational Stress Questionnaire    Feeling of Stress : To some extent  Social Connections: Unknown (12/05/2017)   Social Connection and Isolation Panel [NHANES]    Frequency of Communication with Friends and Family: Not on file    Frequency of Social Gatherings with Friends and Family: Not on file    Attends Religious Services: Not on file    Active Member of Clubs or Organizations: Not on file    Attends Banker Meetings: Not on file    Marital Status: Living with partner  Intimate Partner Violence: Not At Risk (12/05/2017)   Humiliation, Afraid, Rape, and Kick questionnaire    Fear of Current or Ex-Partner: No    Emotionally Abused: No    Physically Abused: No    Sexually Abused: No    Outpatient Medications Prior to Visit  Medication Sig Dispense Refill   cetirizine HCl (ZYRTEC) 5 MG/5ML SOLN Take 5 mLs (5 mg total) by mouth daily. 300 mL    cholecalciferol (VITAMIN D) 25 MCG (1000 UT) tablet Take 1 tablet (1,000 Units total) by mouth daily.     loratadine (ALAVERT) 10 MG dissolvable tablet Take 10 mg by mouth daily.     Menaquinone-7 (VITAMIN K2 PO) Take 1 capsule by mouth daily.     metoprolol succinate (TOPROL-XL) 25 MG 24 hr tablet Take 1 tablet (25 mg total) by mouth 2 (two) times daily. 180 tablet 3   naproxen (NAPROSYN)  500 MG tablet Take 1 tablet (500 mg total) by mouth 2 (two) times daily with a meal. As needed for pain 30 tablet 1   omeprazole (PRILOSEC) 20 MG capsule Take 1 capsule (20 mg total) by mouth 2 (two) times daily before a meal. 60 capsule 11   No facility-administered medications prior to visit.    ROS:  Review of Systems  Constitutional:  Negative for fatigue, fever and unexpected weight change.  Respiratory:  Negative for  cough, shortness of breath and wheezing.   Cardiovascular:  Negative for chest pain, palpitations and leg swelling.  Gastrointestinal:  Negative for blood in stool, constipation, diarrhea, nausea and vomiting.  Endocrine: Negative for cold intolerance, heat intolerance and polyuria.  Genitourinary:  Negative for dyspareunia, dysuria, flank pain, frequency, genital sores, hematuria, menstrual problem, pelvic pain, urgency, vaginal bleeding, vaginal discharge and vaginal pain.  Musculoskeletal:  Negative for back pain, joint swelling and myalgias.  Skin:  Negative for rash.  Neurological:  Negative for dizziness, syncope, light-headedness, numbness and headaches.  Hematological:  Negative for adenopathy.  Psychiatric/Behavioral:  Negative for agitation, confusion, sleep disturbance and suicidal ideas. The patient is not nervous/anxious.   BREAST: tenderness (where she had radiation tx)   Objective: There were no vitals taken for this visit.   Physical Exam Constitutional:      Appearance: She is well-developed.  Genitourinary:     Vulva normal.     Right Labia: No rash, tenderness or lesions.    Left Labia: No tenderness, lesions or rash.    No vaginal discharge, erythema or tenderness.      Right Adnexa: not tender and no mass present.    Left Adnexa: not tender and no mass present.    No cervical motion tenderness, friability or polyp.     Uterus is not enlarged or tender.  Breasts:    Right: No mass, nipple discharge, skin change or tenderness.     Left:  No mass, nipple discharge, skin change or tenderness.  Neck:     Thyroid: No thyromegaly.  Cardiovascular:     Rate and Rhythm: Normal rate and regular rhythm.     Heart sounds: Normal heart sounds. No murmur heard. Pulmonary:     Effort: Pulmonary effort is normal.     Breath sounds: Normal breath sounds.  Chest:    Abdominal:     Palpations: Abdomen is soft.     Tenderness: There is no abdominal tenderness. There is no guarding or rebound.  Musculoskeletal:        General: Normal range of motion.     Cervical back: Normal range of motion.  Lymphadenopathy:     Cervical: No cervical adenopathy.  Neurological:     General: No focal deficit present.     Mental Status: She is alert and oriented to person, place, and time.     Cranial Nerves: No cranial nerve deficit.  Skin:    General: Skin is warm and dry.  Psychiatric:        Mood and Affect: Mood normal.        Behavior: Behavior normal.        Thought Content: Thought content normal.        Judgment: Judgment normal.  Vitals reviewed.     Assessment/Plan: Encounter for annual routine gynecological examination  Malignant neoplasm of upper-outer quadrant of right breast in female, estrogen receptor positive (HCC); doing well, no longer on tx.   Dysmenorrhea - Plan: US PELVIC COMPLETE WITH TRANSVAGINAL; worsening sx. Check GYN u/s, will f/u with results. Can't tolerate NSAIDs well. Try midol.   Axillary mass, left--hard to feel, question lymph node. Follow sx. If persist, get larger, will check u/s.   No orders of the defined types were placed in this encounter.  Rx diflucan prn sx since pt on amox.         GYN counsel breast self exam,, adequate intake of calcium and vitamin D, diet and exercise  F/U  No follow-ups on file.  Sammy Cassar B. Thula Stewart, PA-C 01/29/2023 2:59 PM

## 2023-01-30 ENCOUNTER — Other Ambulatory Visit (HOSPITAL_COMMUNITY)
Admission: RE | Admit: 2023-01-30 | Discharge: 2023-01-30 | Disposition: A | Discharge status: Home / Self Care | Payer: BC Managed Care – PPO | Source: Ambulatory Visit | Attending: Obstetrics and Gynecology | Admitting: Obstetrics and Gynecology

## 2023-01-30 ENCOUNTER — Ambulatory Visit (INDEPENDENT_AMBULATORY_CARE_PROVIDER_SITE_OTHER): Payer: BC Managed Care – PPO | Admitting: Obstetrics and Gynecology

## 2023-01-30 ENCOUNTER — Encounter: Payer: Self-pay | Admitting: Obstetrics and Gynecology

## 2023-01-30 VITALS — BP 110/60 | Ht 62.0 in | Wt 140.0 lb

## 2023-01-30 DIAGNOSIS — Z124 Encounter for screening for malignant neoplasm of cervix: Secondary | ICD-10-CM

## 2023-01-30 DIAGNOSIS — N946 Dysmenorrhea, unspecified: Secondary | ICD-10-CM

## 2023-01-30 DIAGNOSIS — Z1231 Encounter for screening mammogram for malignant neoplasm of breast: Secondary | ICD-10-CM

## 2023-01-30 DIAGNOSIS — Z113 Encounter for screening for infections with a predominantly sexual mode of transmission: Secondary | ICD-10-CM | POA: Diagnosis not present

## 2023-01-30 DIAGNOSIS — Z01411 Encounter for gynecological examination (general) (routine) with abnormal findings: Secondary | ICD-10-CM

## 2023-01-30 DIAGNOSIS — R229 Localized swelling, mass and lump, unspecified: Secondary | ICD-10-CM | POA: Diagnosis not present

## 2023-01-30 DIAGNOSIS — Z1151 Encounter for screening for human papillomavirus (HPV): Secondary | ICD-10-CM

## 2023-01-30 DIAGNOSIS — Z170 Estrogen receptor positive status [ER+]: Secondary | ICD-10-CM

## 2023-01-30 DIAGNOSIS — N921 Excessive and frequent menstruation with irregular cycle: Secondary | ICD-10-CM

## 2023-01-30 DIAGNOSIS — Z01419 Encounter for gynecological examination (general) (routine) without abnormal findings: Secondary | ICD-10-CM

## 2023-01-30 MED ORDER — NAPROXEN 500 MG PO TABS: 500.0000 mg | ORAL_TABLET | Freq: Two times a day (BID) | ORAL | 2 refills | Status: DC

## 2023-01-30 NOTE — Patient Instructions (Signed)
I value your feedback and you entrusting us with your care. If you get a [facility] patient survey, I would appreciate you taking the time to let us know about your experience today. Thank you! ? ? ?

## 2023-02-01 LAB — CYTOLOGY - PAP
Comment: NEGATIVE
Diagnosis: NEGATIVE
High risk HPV: NEGATIVE

## 2023-02-02 DIAGNOSIS — M9901 Segmental and somatic dysfunction of cervical region: Secondary | ICD-10-CM | POA: Diagnosis not present

## 2023-02-02 DIAGNOSIS — M9906 Segmental and somatic dysfunction of lower extremity: Secondary | ICD-10-CM | POA: Diagnosis not present

## 2023-02-02 DIAGNOSIS — M9902 Segmental and somatic dysfunction of thoracic region: Secondary | ICD-10-CM | POA: Diagnosis not present

## 2023-02-02 DIAGNOSIS — M9903 Segmental and somatic dysfunction of lumbar region: Secondary | ICD-10-CM | POA: Diagnosis not present

## 2023-02-20 DIAGNOSIS — M9902 Segmental and somatic dysfunction of thoracic region: Secondary | ICD-10-CM | POA: Diagnosis not present

## 2023-02-20 DIAGNOSIS — M9901 Segmental and somatic dysfunction of cervical region: Secondary | ICD-10-CM | POA: Diagnosis not present

## 2023-02-20 DIAGNOSIS — M9903 Segmental and somatic dysfunction of lumbar region: Secondary | ICD-10-CM | POA: Diagnosis not present

## 2023-02-20 DIAGNOSIS — M9906 Segmental and somatic dysfunction of lower extremity: Secondary | ICD-10-CM | POA: Diagnosis not present

## 2023-03-02 DIAGNOSIS — Z1231 Encounter for screening mammogram for malignant neoplasm of breast: Secondary | ICD-10-CM | POA: Diagnosis not present

## 2023-03-02 LAB — HM MAMMOGRAPHY

## 2023-03-05 DIAGNOSIS — M9902 Segmental and somatic dysfunction of thoracic region: Secondary | ICD-10-CM | POA: Diagnosis not present

## 2023-03-05 DIAGNOSIS — M9906 Segmental and somatic dysfunction of lower extremity: Secondary | ICD-10-CM | POA: Diagnosis not present

## 2023-03-05 DIAGNOSIS — M9903 Segmental and somatic dysfunction of lumbar region: Secondary | ICD-10-CM | POA: Diagnosis not present

## 2023-03-05 DIAGNOSIS — M9901 Segmental and somatic dysfunction of cervical region: Secondary | ICD-10-CM | POA: Diagnosis not present

## 2023-03-06 ENCOUNTER — Encounter: Payer: Self-pay | Admitting: Obstetrics and Gynecology

## 2023-03-19 DIAGNOSIS — M9902 Segmental and somatic dysfunction of thoracic region: Secondary | ICD-10-CM | POA: Diagnosis not present

## 2023-03-19 DIAGNOSIS — M9903 Segmental and somatic dysfunction of lumbar region: Secondary | ICD-10-CM | POA: Diagnosis not present

## 2023-03-19 DIAGNOSIS — M9906 Segmental and somatic dysfunction of lower extremity: Secondary | ICD-10-CM | POA: Diagnosis not present

## 2023-03-19 DIAGNOSIS — M9901 Segmental and somatic dysfunction of cervical region: Secondary | ICD-10-CM | POA: Diagnosis not present

## 2023-03-30 DIAGNOSIS — M9906 Segmental and somatic dysfunction of lower extremity: Secondary | ICD-10-CM | POA: Diagnosis not present

## 2023-03-30 DIAGNOSIS — M9903 Segmental and somatic dysfunction of lumbar region: Secondary | ICD-10-CM | POA: Diagnosis not present

## 2023-03-30 DIAGNOSIS — M9901 Segmental and somatic dysfunction of cervical region: Secondary | ICD-10-CM | POA: Diagnosis not present

## 2023-03-30 DIAGNOSIS — M9902 Segmental and somatic dysfunction of thoracic region: Secondary | ICD-10-CM | POA: Diagnosis not present

## 2023-04-11 DIAGNOSIS — M9903 Segmental and somatic dysfunction of lumbar region: Secondary | ICD-10-CM | POA: Diagnosis not present

## 2023-04-11 DIAGNOSIS — M9901 Segmental and somatic dysfunction of cervical region: Secondary | ICD-10-CM | POA: Diagnosis not present

## 2023-04-11 DIAGNOSIS — S46011A Strain of muscle(s) and tendon(s) of the rotator cuff of right shoulder, initial encounter: Secondary | ICD-10-CM | POA: Diagnosis not present

## 2023-04-11 DIAGNOSIS — M9906 Segmental and somatic dysfunction of lower extremity: Secondary | ICD-10-CM | POA: Diagnosis not present

## 2023-04-13 ENCOUNTER — Encounter: Payer: Self-pay | Admitting: Physician Assistant

## 2023-04-13 ENCOUNTER — Ambulatory Visit: Payer: BC Managed Care – PPO | Admitting: Physician Assistant

## 2023-04-13 VITALS — BP 104/70 | HR 68 | Ht 62.0 in | Wt 141.4 lb

## 2023-04-13 DIAGNOSIS — K219 Gastro-esophageal reflux disease without esophagitis: Secondary | ICD-10-CM

## 2023-04-13 DIAGNOSIS — R140 Abdominal distension (gaseous): Secondary | ICD-10-CM | POA: Diagnosis not present

## 2023-04-13 DIAGNOSIS — R143 Flatulence: Secondary | ICD-10-CM

## 2023-04-13 DIAGNOSIS — R1013 Epigastric pain: Secondary | ICD-10-CM | POA: Diagnosis not present

## 2023-04-13 NOTE — Progress Notes (Signed)
Addendum: Reviewed and agree with assessment and management plan. Pyrtle, Jay M, MD  

## 2023-04-13 NOTE — Patient Instructions (Signed)
_______________________________________________________  If your blood pressure at your visit was 140/90 or greater, please contact your primary care physician to follow up on this.  _______________________________________________________  If you are age 48 or older, your body mass index should be between 23-30. Your Body mass index is 25.86 kg/m. If this is out of the aforementioned range listed, please consider follow up with your Primary Care Provider.  If you are age 60 or younger, your body mass index should be between 19-25. Your Body mass index is 25.86 kg/m. If this is out of the aformentioned range listed, please consider follow up with your Primary Care Provider.   Try gas -X with meals.  You have been given a testing kit to check for small intestine bacterial overgrowth (SIBO) which is completed by a company named [employer]. Make sure to return your test in the mail using the return mailing label given to you along with the kit. The test order, your demographic and insurance information have all already been sent to the company. [facility] will collect an upfront charge of $99.74 for commercial insurance plans and $209.74 is you are paying cash. Make sure to discuss with [facility] PRIOR to having the test to see if they have gotten information from your insurance company as to how much your testing will cost out of pocket, if any. Please contact [facility] at phone number [phone] to get instructions regarding how to perform the test as our office is unable to give specific testing instructions.  ________________________________________________________  The [facility] providers would like to encourage you to use [facility] to communicate with providers for non-urgent requests or questions.  Due to long hold times on the telephone, sending your provider a message by [facility] may be a faster and more efficient way to get a response.  Please allow 48 business hours  for a response.  Please remember that this is for non-urgent requests.   It was a pleasure to see you today!  Thank you for trusting me with your gastrointestinal care!

## 2023-04-13 NOTE — Progress Notes (Signed)
Chief Complaint: Bloating and gas  HPI:    [name] is a 47 year old female with a past medical history as listed below including breast cancer, GERD and IBS, known to Dr. Rhea Belton, who presents to clinic today for follow-up of her epigastric pain as well as some bloating and gas.    10/10/2021 EGD and colonoscopy. Colonoscopy was normal. Repeat recommended in 10 years. EGD with 2 cm hiatal hernia and otherwise normal. Patient continued on Omeprazole 20 mg daily. Biopsies with mild reactive changes.     01/12/2023 patient seen in clinic and at that time was having some increase in epigastric pain.  Told her to increase Omeprazole to 20 mg twice daily for a little while and then go back to her normal dosage.  Also discussed possible musculoskeletal overlap given her pendulous breasts.    Today, patient presents to clinic and tells me that she only took increased dosage of Omeprazole for a few days and that pain seemed to go away.  Her primary complaint now is of increased gas and bloating which is uncomfortable, worse at night when she lays down to sleep.  Tells me that she always hears her stomach moving nothing seems to help.  Apparently this all started after she tried a colostrum supplement which she has stopped over the past month and 1/2-2.  Also tells me her stools were looser around this time and she just recently started AG1 which seems to help a little bit.  She has tried Gas-X but only when she has a symptom of bloating which is hours after eating.  Denies any big changes in diet or other changes in medication.    Denies fever, chills or weight loss.  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 +, Her2 neu -; neg lymph nodes; s/p lumpectomy, radiation, attempted tamoxifen with side effects   Cervical dysplasia    Gastrointestinal disorder    GERD (gastroesophageal reflux disease)    Heart  palpitations    IBS (irritable bowel syndrome)    Ovarian cyst     Past Surgical History:  Procedure Laterality Date   arm surgery     left   BREAST LUMPECTOMY WITH RADIOACTIVE SEED AND SENTINEL LYMPH NODE BIOPSY Right 03/01/2016   Procedure: RIGHT BREAST LUMPECTOMY WITH RADIOACTIVE SEED AND SENTINEL LYMPH NODE BIOPSY;  Surgeon: Almond Lint, MD;  Location: [facility];  Service: General;  Laterality: Right;   COLONOSCOPY     CRYOTHERAPY     EXCISION MASS LOWER EXTREMETIES Right 03/01/2016   Procedure: EXCISION MASS RIGHT THIGH;  Surgeon: Almond Lint, MD;  Location: [facility];  Service: General;  Laterality: Right;   MOUTH SURGERY     "gum surgery" x 2   TONSILLECTOMY     WISDOM TOOTH EXTRACTION      Current Outpatient Medications  Medication Sig Dispense Refill   cetirizine HCl (ZYRTEC) 5 MG/5ML SOLN Take 5 mLs (5 mg total) by mouth daily. 300 mL    cholecalciferol (VITAMIN D) 25 MCG (1000 UT) tablet Take 1 tablet (1,000 Units total) by mouth daily.     ibuprofen (ADVIL) 800 MG tablet Take 800 mg by mouth as needed.     loratadine (ALAVERT) 10 MG dissolvable tablet Take 10 mg by mouth daily.     Menaquinone-7 (VITAMIN K2 PO) Take 1 capsule  by mouth daily.     metoprolol succinate (TOPROL-XL) 25 MG 24 hr tablet Take 1 tablet (25 mg total) by mouth 2 (two) times daily. 180 tablet 3   naproxen (NAPROSYN) 500 MG tablet Take 1 tablet (500 mg total) by mouth 2 (two) times daily with a meal. As needed for pain 30 tablet 2   omeprazole (PRILOSEC) 20 MG capsule Take 1 capsule (20 mg total) by mouth 2 (two) times daily before a meal. 60 capsule 11   Propylene Glycol (SYSTANE BALANCE) 0.6 % SOLN Place 1 drop into both eyes as needed.     triamcinolone (NASACORT ALLERGY 24HR) 55 MCG/ACT AERO nasal inhaler Place 1 spray into the nose as needed.     No current facility-administered medications for this visit.    Allergies as of 04/13/2023 - Review Complete  04/13/2023  Allergen Reaction Noted   Sunscreen spf30 [solbar pf spf15] Hives, Itching, and Rash 08/21/2016   Cephalexin Rash and Other (See Comments) 10/30/2016    Family History  Problem Relation Age of Onset   Hypertension Mother    Hyperlipidemia Mother    Hyperlipidemia Father    Prostate cancer Father    Hypertension Father    Prostate cancer Paternal Uncle    Heart failure Maternal Grandmother    Heart failure Maternal Grandfather    Bone cancer Paternal Grandfather    Colon cancer Other        PGGF   Ovarian cancer Cousin 69   Esophageal cancer Neg Hx    Rectal cancer Neg Hx    Stomach cancer Neg Hx     Social History   Socioeconomic History   Marital status: Single    Spouse name: Not on file   Number of children: 0   Years of education: Not on file   Highest education level: Not on file  Occupational History   Occupation: Investment banker, corporate  Tobacco Use   Smoking status: Never   Smokeless tobacco: Never  Vaping Use   Vaping status: Never Used  Substance and Sexual Activity   Alcohol use: Yes    Comment: 0-1 drinks daily   Drug use: No   Sexual activity: Yes    Birth control/protection: None  Other Topics Concern   Not on file  Social History Narrative   Not on file   Social Determinants of Health   Financial Resource Strain: Not on file  Food Insecurity: Not on file  Transportation Needs: Not on file  Physical Activity: Unknown (12/05/2017)   Exercise Vital Sign    Days of Exercise per Week: 0 days    Minutes of Exercise per Session: Not on file  Stress: Stress Concern Present (12/05/2017)   [employer] of Occupational Health - Occupational Stress Questionnaire    Feeling of Stress : To some extent  Social Connections: Unknown (12/05/2017)   Social Connection and Isolation Panel [NHANES]    Frequency of Communication with Friends and Family: Not on file    Frequency of Social Gatherings with Friends and Family: Not on file    Attends  Religious Services: Not on file    Active Member of Clubs or Organizations: Not on file    Attends Banker Meetings: Not on file    Marital Status: Living with partner  Intimate Partner Violence: Not At Risk (12/05/2017)   Humiliation, Afraid, Rape, and Kick questionnaire    Fear of Current or Ex-Partner: No    Emotionally Abused: No    Physically  Abused: No    Sexually Abused: No    Review of Systems:    Constitutional: No weight loss, fever or chills Cardiovascular: No chest pain  Respiratory: No SOB Gastrointestinal: See HPI and otherwise negative   Physical Exam:  Vital signs: BP 104/70 (BP Location: Left Arm, Patient Position: Sitting, Cuff Size: Normal)   Pulse 68   Ht 5\' 2"  (1.575 m)   Wt 141 lb 6 oz (64.1 kg)   LMP 03/24/2023   BMI 25.86 kg/m    Constitutional:   Pleasant Caucasian female appears to be in NAD, Well developed, Well nourished, alert and cooperative Respiratory: Respirations even and unlabored. Lungs clear to auscultation bilaterally.   No wheezes, crackles, or rhonchi.  Cardiovascular: Normal S1, S2. No MRG. Regular rate and rhythm. No peripheral edema, cyanosis or pallor.  Gastrointestinal:  Soft, nondistended, nontender. No rebound or guarding. Normal bowel sounds. No appreciable masses or hepatomegaly. Rectal:  Not performed.  Psychiatric: Demonstrates good judgement and reason without abnormal affect or behaviors.  RELEVANT LABS AND IMAGING: CBC    Component Value Date/Time   WBC 6.4 07/06/2018 1430   RBC 4.55 07/06/2018 1430   HGB 14.3 07/06/2018 1430   HGB 14.8 10/23/2016 1208   HCT 44.0 07/06/2018 1430   HCT 44.2 10/23/2016 1208   PLT 288 07/06/2018 1430   PLT 239 10/23/2016 1208   MCV 96.7 07/06/2018 1430   MCV 95.7 10/23/2016 1208   MCH 31.4 07/06/2018 1430   MCHC 32.5 07/06/2018 1430   RDW 11.8 07/06/2018 1430   RDW 12.2 10/23/2016 1208   LYMPHSABS 1.4 07/06/2018 1430   LYMPHSABS 1.0 10/23/2016 1208   MONOABS 0.6  07/06/2018 1430   MONOABS 0.6 10/23/2016 1208   EOSABS 0.1 07/06/2018 1430   EOSABS 0.3 10/23/2016 1208   BASOSABS 0.0 07/06/2018 1430   BASOSABS 0.0 10/23/2016 1208    CMP     Component Value Date/Time   NA 141 07/06/2018 1430   NA 140 01/19/2016 1227   K 3.5 07/06/2018 1430   K 3.7 01/19/2016 1227   CL 105 07/06/2018 1430   CO2 26 07/06/2018 1430   CO2 23 01/19/2016 1227   GLUCOSE 83 07/06/2018 1430   GLUCOSE 99 01/19/2016 1227   BUN 9 07/06/2018 1430   BUN 15.1 01/19/2016 1227   CREATININE 0.72 07/06/2018 1430   CREATININE 0.9 01/19/2016 1227   CALCIUM 9.2 07/06/2018 1430   CALCIUM 9.2 01/19/2016 1227   PROT 7.0 07/06/2018 1430   PROT 6.6 01/19/2016 1227   ALBUMIN 4.1 07/06/2018 1430   ALBUMIN 3.7 01/19/2016 1227   AST 18 07/06/2018 1430   AST 13 01/19/2016 1227   ALT 16 07/06/2018 1430   ALT 12 01/19/2016 1227   ALKPHOS 56 07/06/2018 1430   ALKPHOS 55 01/19/2016 1227   BILITOT 0.6 07/06/2018 1430   BILITOT 0.36 01/19/2016 1227   GFRNONAA >60 07/06/2018 1430   GFRAA >60 07/06/2018 1430    Assessment: 1.  Bloating and gas: Recent increase over the past couple of months, initially started after trying a colostrum supplement; consider diet relation versus SIBO versus other 2.  Epigastric pain: Resolved per patient 3.  GERD: Controlled on Meprazole 20 mg daily 4.  Change in bowel habits: Towards looser stools over the past few months, seems a little bit better with new AG 1 supplement daily; consider relation to bloating/gas and IBS +/- diet  Plan: 1.  We will test the patient for SIBO with a breath test.  2.  Discussed that patient should limit caffeinated beverages and drinking through straws as well as chewing gum.  Also provided her with low gas producing diet handout. 3.  Encouraged the patient to try Gas-X with meals. 4.  Patient to continue Omeprazole 20 mg daily 5.  Patient to follow in clinic in 2 to 3 months or as directed by testing above.  Hyacinth Meeker, PA-C [facility] 04/13/2023, 10:13 AM

## 2023-04-25 DIAGNOSIS — S46011A Strain of muscle(s) and tendon(s) of the rotator cuff of right shoulder, initial encounter: Secondary | ICD-10-CM | POA: Diagnosis not present

## 2023-04-25 DIAGNOSIS — M9901 Segmental and somatic dysfunction of cervical region: Secondary | ICD-10-CM | POA: Diagnosis not present

## 2023-04-25 DIAGNOSIS — M9903 Segmental and somatic dysfunction of lumbar region: Secondary | ICD-10-CM | POA: Diagnosis not present

## 2023-04-25 DIAGNOSIS — M9906 Segmental and somatic dysfunction of lower extremity: Secondary | ICD-10-CM | POA: Diagnosis not present

## 2023-05-09 DIAGNOSIS — M9901 Segmental and somatic dysfunction of cervical region: Secondary | ICD-10-CM | POA: Diagnosis not present

## 2023-05-09 DIAGNOSIS — S46011A Strain of muscle(s) and tendon(s) of the rotator cuff of right shoulder, initial encounter: Secondary | ICD-10-CM | POA: Diagnosis not present

## 2023-05-09 DIAGNOSIS — M9903 Segmental and somatic dysfunction of lumbar region: Secondary | ICD-10-CM | POA: Diagnosis not present

## 2023-05-09 DIAGNOSIS — M9906 Segmental and somatic dysfunction of lower extremity: Secondary | ICD-10-CM | POA: Diagnosis not present

## 2023-05-13 ENCOUNTER — Encounter (HOSPITAL_BASED_OUTPATIENT_CLINIC_OR_DEPARTMENT_OTHER): Payer: Self-pay | Admitting: Emergency Medicine

## 2023-05-13 ENCOUNTER — Emergency Department (HOSPITAL_BASED_OUTPATIENT_CLINIC_OR_DEPARTMENT_OTHER)
Admission: EM | Admit: 2023-05-13 | Discharge: 2023-05-13 | Disposition: A | Discharge status: Home / Self Care | Payer: BC Managed Care – PPO | Attending: Emergency Medicine | Admitting: Emergency Medicine

## 2023-05-13 DIAGNOSIS — M549 Dorsalgia, unspecified: Secondary | ICD-10-CM

## 2023-05-13 DIAGNOSIS — Z853 Personal history of malignant neoplasm of breast: Secondary | ICD-10-CM | POA: Insufficient documentation

## 2023-05-13 DIAGNOSIS — M546 Pain in thoracic spine: Secondary | ICD-10-CM | POA: Diagnosis not present

## 2023-05-13 DIAGNOSIS — R1013 Epigastric pain: Secondary | ICD-10-CM | POA: Insufficient documentation

## 2023-05-13 DIAGNOSIS — M545 Low back pain, unspecified: Secondary | ICD-10-CM | POA: Diagnosis not present

## 2023-05-13 LAB — COMPREHENSIVE METABOLIC PANEL
ALT: 10 U/L (ref 0–44)
AST: 15 U/L (ref 15–41)
Albumin: 4.4 g/dL (ref 3.5–5.0)
Alkaline Phosphatase: 58 U/L (ref 38–126)
Anion gap: 9 (ref 5–15)
BUN: 15 mg/dL (ref 6–20)
CO2: 25 mmol/L (ref 22–32)
Calcium: 8.9 mg/dL (ref 8.9–10.3)
Chloride: 104 mmol/L (ref 98–111)
Creatinine, Ser: 0.7 mg/dL (ref 0.44–1.00)
GFR, Estimated: >60 mL/min (ref >60)
Glucose, Bld: 81 mg/dL (ref 70–99)
Potassium: 3.4 mmol/L — ABNORMAL LOW (ref 3.5–5.1)
Sodium: 138 mmol/L (ref 135–145)
Total Bilirubin: 0.4 mg/dL (ref 0.3–1.2)
Total Protein: 7.1 g/dL (ref 6.5–8.1)

## 2023-05-13 LAB — CBC WITH DIFFERENTIAL/PLATELET
Abs Immature Granulocytes: 0.03 10*3/uL (ref 0.00–0.07)
Basophils Absolute: 0.1 10*3/uL (ref 0.0–0.1)
Basophils Relative: 1 %
Eosinophils Absolute: 0.1 10*3/uL (ref 0.0–0.5)
Eosinophils Relative: 1 %
HCT: 43.7 % (ref 36.0–46.0)
Hemoglobin: 14.6 g/dL (ref 12.0–15.0)
Immature Granulocytes: 0 %
Lymphocytes Relative: 25 %
Lymphs Abs: 2.1 10*3/uL (ref 0.7–4.0)
MCH: 32.1 pg (ref 26.0–34.0)
MCHC: 33.4 g/dL (ref 30.0–36.0)
MCV: 96 fL (ref 80.0–100.0)
Monocytes Absolute: 0.7 10*3/uL (ref 0.1–1.0)
Monocytes Relative: 8 %
Neutro Abs: 5.6 10*3/uL (ref 1.7–7.7)
Neutrophils Relative %: 65 %
Platelets: 300 10*3/uL (ref 150–400)
RBC: 4.55 MIL/uL (ref 3.87–5.11)
RDW: 12.2 % (ref 11.5–15.5)
WBC: 8.7 10*3/uL (ref 4.0–10.5)
nRBC: 0 % (ref 0.0–0.2)

## 2023-05-13 LAB — URINALYSIS, ROUTINE W REFLEX MICROSCOPIC
Bilirubin Urine: NEGATIVE
Glucose, UA: NEGATIVE mg/dL
Hgb urine dipstick: NEGATIVE
Ketones, ur: 15 mg/dL — AB
Leukocytes,Ua: NEGATIVE
Nitrite: NEGATIVE
Protein, ur: NEGATIVE mg/dL
Specific Gravity, Urine: 1.013 (ref 1.005–1.030)
pH: 5.5 (ref 5.0–8.0)

## 2023-05-13 LAB — PREGNANCY, URINE: Preg Test, Ur: NEGATIVE

## 2023-05-13 LAB — LIPASE, BLOOD: Lipase: 24 U/L (ref 11–51)

## 2023-05-13 MED ORDER — OXYCODONE-ACETAMINOPHEN 5-325 MG PO TABS
1.0000 | ORAL_TABLET | Freq: Once | ORAL | Status: AC
  Administered 2023-05-13: 1 via ORAL
  Filled 2023-05-13: qty 1

## 2023-05-13 MED ORDER — NAPROXEN 500 MG PO TABS: 500.0000 mg | ORAL_TABLET | Freq: Two times a day (BID) | ORAL | 0 refills | Status: DC

## 2023-05-13 MED ORDER — CYCLOBENZAPRINE HCL 10 MG PO TABS: 10.0000 mg | ORAL_TABLET | Freq: Two times a day (BID) | ORAL | 0 refills | Status: DC | PRN

## 2023-05-13 MED ORDER — KETOROLAC TROMETHAMINE 15 MG/ML IJ SOLN
15.0000 mg | Freq: Once | INTRAMUSCULAR | Status: AC
  Administered 2023-05-13: 15 mg via INTRAVENOUS
  Filled 2023-05-13: qty 1

## 2023-05-13 MED ORDER — LIDOCAINE 5 % EX PTCH
1.0000 | MEDICATED_PATCH | CUTANEOUS | Status: DC
  Administered 2023-05-13: 1 via TRANSDERMAL
  Filled 2023-05-13: qty 1

## 2023-05-13 NOTE — ED Provider Notes (Signed)
[facility] AT [facility] Provider Note   CSN: [id number] Arrival date & time: 05/13/23  1529     History  Chief Complaint  Patient presents with   Back Pain    Allison Mcclure is a 47 y.o. female history of breast cancer, GERD presents today for evaluation of back pain and abdominal pain.  Patient states her symptoms started on Thursday.  Pain is in her right upper back.  Worse when she sits straight up. States that yesterday she developed intermittent pain in the epigastrium.  Denies fever, nausea, vomiting, bowel changes, urinary symptoms, chest pain or shortness of breath.  Denies any abnormal vaginal discharge.   Back Pain   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 +, Her2 neu -; neg lymph nodes; s/p lumpectomy, radiation, attempted tamoxifen with side effects   Cervical dysplasia    Gastrointestinal disorder    GERD (gastroesophageal reflux disease)    Heart palpitations    IBS (irritable bowel syndrome)    Ovarian cyst    Past Surgical History:  Procedure Laterality Date   arm surgery     left   BREAST LUMPECTOMY WITH RADIOACTIVE SEED AND SENTINEL LYMPH NODE BIOPSY Right 03/01/2016   Procedure: RIGHT BREAST LUMPECTOMY WITH RADIOACTIVE SEED AND SENTINEL LYMPH NODE BIOPSY;  Surgeon: Almond Lint, MD;  Location: [facility];  Service: General;  Laterality: Right;   COLONOSCOPY     CRYOTHERAPY     EXCISION MASS LOWER EXTREMETIES Right 03/01/2016   Procedure: EXCISION MASS RIGHT THIGH;  Surgeon: Almond Lint, MD;  Location: [facility];  Service: General;  Laterality: Right;   MOUTH SURGERY     "gum surgery" x 2   TONSILLECTOMY     WISDOM TOOTH EXTRACTION       Home Medications Prior to Admission medications   Medication Sig Start Date End Date Taking? Authorizing Provider  cetirizine HCl (ZYRTEC) 5 MG/5ML  SOLN Take 5 mLs (5 mg total) by mouth daily. 05/06/19   Serena Croissant, MD  cholecalciferol (VITAMIN D) 25 MCG (1000 UT) tablet Take 1 tablet (1,000 Units total) by mouth daily. 05/06/19   Serena Croissant, MD  ibuprofen (ADVIL) 800 MG tablet Take 800 mg by mouth as needed. 04/12/23   [provider]  loratadine (ALAVERT) 10 MG dissolvable tablet Take 10 mg by mouth daily.    [provider]  Menaquinone-7 (VITAMIN K2 PO) Take 1 capsule by mouth daily.    [provider]  metoprolol succinate (TOPROL-XL) 25 MG 24 hr tablet Take 1 tablet (25 mg total) by mouth 2 (two) times daily. 11/13/22   O'Neal, Ronnald Ramp, MD  naproxen (NAPROSYN) 500 MG tablet Take 1 tablet (500 mg total) by mouth 2 (two) times daily with a meal. As needed for pain 01/30/23   Copland, Alicia B, PA-C  omeprazole (PRILOSEC) 20 MG capsule Take 1 capsule (20 mg total) by mouth 2 (two) times daily before a meal. 01/12/23   Lemmon, Violet Baldy, PA  Propylene Glycol (SYSTANE BALANCE) 0.6 % SOLN Place 1 drop into both eyes as needed.    [provider]  triamcinolone (NASACORT ALLERGY 24HR) 55 MCG/ACT AERO nasal inhaler Place 1 spray into the nose as needed.    [provider]      Allergies    Sunscreen spf30 [  solbar pf spf15] and Cephalexin    Review of Systems   Review of Systems  Musculoskeletal:  Positive for back pain.    Physical Exam Updated Vital Signs BP 124/63 (BP Location: Right Arm)   Pulse 64   Temp 98.5 F (36.9 C) (Oral)   Resp 18   LMP 04/22/2023   SpO2 100%  Physical Exam Vitals and nursing note reviewed.  Constitutional:      Appearance: Normal appearance.  HENT:     Head: Normocephalic and atraumatic.     Mouth/Throat:     Mouth: Mucous membranes are moist.  Eyes:     General: No scleral icterus. Cardiovascular:     Rate and Rhythm: Normal rate and regular rhythm.     Pulses: Normal pulses.     Heart sounds: Normal heart sounds.  Pulmonary:      Effort: Pulmonary effort is normal.     Breath sounds: Normal breath sounds.  Abdominal:     General: Abdomen is flat.     Palpations: Abdomen is soft.     Tenderness: There is no abdominal tenderness.  Musculoskeletal:        General: No deformity.     Comments: Tenderness to palpation to the right upper back.  No midline tenderness in the thoracic spine.  Skin:    General: Skin is warm.     Findings: No rash.  Neurological:     General: No focal deficit present.     Mental Status: She is alert.  Psychiatric:        Mood and Affect: Mood normal.    ED Results / Procedures / Treatments   Labs (all labs ordered are listed, but only abnormal results are displayed) Labs Reviewed - No data to display  EKG None  Radiology No results found.  Procedures Procedures    Medications Ordered in ED Medications  lidocaine (LIDODERM) 5 % 1 patch (has no administration in time range)  oxyCODONE-acetaminophen (PERCOCET/ROXICET) 5-325 MG per tablet 1 tablet (has no administration in time range)    ED Course/ Medical Decision Making/ A&P                                 Medical Decision Making Amount and/or Complexity of Data Reviewed Labs: ordered.  Risk Prescription drug management.   This patient presents to the ED for back pain, this involves an extensive number of treatment options, and is a complaint that carries with a high risk of complications and morbidity.  The differential diagnosis includes pneumonia, musculoskeletal pain, PE,.  This is not an exhaustive list.  Lab tests: I ordered and personally interpreted labs.  The pertinent results include: WBC unremarkable. Hbg unremarkable. Platelets unremarkable. Electrolytes unremarkable. BUN, creatinine unremarkable.  Lipase normal.  UA is unremarkable.  Problem list/ ED course/ Critical interventions/ Medical management: HPI: See above Vital signs within normal range and stable throughout visit. Laboratory/imaging  studies significant for: See above. On physical examination, patient is afebrile and appears in no acute distress.  There was tenderness to palpation to left upper back.  No midline tenderness of the cervical or thoracic spine.  Patient is PERC negative.  Abdominal exam with no peritoneal sign.  Well-appearing.  Labs including CBC, CMP, lipase, UA was unremarkable.  Given Percocet, Toradol and Alcaine patch for pain.  Reevaluation of patient after this medication showed that symptom improved. Likely musculoskeletal pain.  Unlikely pyelonephritis, patient is  afebrile, has no urinary symptoms.  Unlikely pneumonia, patient has no fever, cough or respiratory symptoms. Advised patient to take Tylenol/ibuprofen/naproxen and muscle relaxant for pain, follow-up with primary care physician for further evaluation and management, return to the ER if new or worsening symptoms. I have reviewed the patient home medicines and have made adjustments as needed.  Cardiac monitoring/EKG: The patient was maintained on a cardiac monitor.  I personally reviewed and interpreted the cardiac monitor which showed an underlying rhythm of: sinus rhythm.  Additional history obtained: External records from outside source obtained and reviewed including: Chart review including previous notes, labs, imaging.  Consultations obtained:  Disposition Continued outpatient therapy. Follow-up with PCP recommended for reevaluation of symptoms. Treatment plan discussed with patient.  Pt acknowledged understanding was agreeable to the plan. Worrisome signs and symptoms were discussed with patient, and patient acknowledged understanding to return to the ED if they noticed these signs and symptoms. Patient was stable upon discharge.   This chart was dictated using voice recognition software.  Despite best efforts to proofread,  errors can occur which can change the documentation meaning.          Final Clinical Impression(s) / ED  Diagnoses Final diagnoses:  Acute upper back pain    Rx / DC Orders ED Discharge Orders          Ordered    cyclobenzaprine (FLEXERIL) 10 MG tablet  2 times daily PRN        05/13/23 1906    naproxen (NAPROSYN) 500 MG tablet  2 times daily        05/13/23 1906              Jeanelle Malling, [place] 05/13/23 1925    Pollyann Savoy, MD 05/13/23 [phone]

## 2023-05-13 NOTE — Discharge Instructions (Addendum)
Please take your medications as prescribed. Take tylenol/naproxen for pain. I recommend close follow-up with PCP for reevaluation.  Please do not hesitate to return to emergency department if worrisome signs symptoms we discussed become apparent such as chest pain or shortness of breath.

## 2023-05-13 NOTE — ED Triage Notes (Signed)
Mid to upper back pain. Started Thursday, getting worse, Ibuprofen helps some

## 2023-05-15 DIAGNOSIS — M9903 Segmental and somatic dysfunction of lumbar region: Secondary | ICD-10-CM | POA: Diagnosis not present

## 2023-05-15 DIAGNOSIS — S46011A Strain of muscle(s) and tendon(s) of the rotator cuff of right shoulder, initial encounter: Secondary | ICD-10-CM | POA: Diagnosis not present

## 2023-05-15 DIAGNOSIS — M9901 Segmental and somatic dysfunction of cervical region: Secondary | ICD-10-CM | POA: Diagnosis not present

## 2023-05-15 DIAGNOSIS — M9906 Segmental and somatic dysfunction of lower extremity: Secondary | ICD-10-CM | POA: Diagnosis not present

## 2023-05-18 DIAGNOSIS — M9906 Segmental and somatic dysfunction of lower extremity: Secondary | ICD-10-CM | POA: Diagnosis not present

## 2023-05-18 DIAGNOSIS — S46011A Strain of muscle(s) and tendon(s) of the rotator cuff of right shoulder, initial encounter: Secondary | ICD-10-CM | POA: Diagnosis not present

## 2023-05-18 DIAGNOSIS — M9901 Segmental and somatic dysfunction of cervical region: Secondary | ICD-10-CM | POA: Diagnosis not present

## 2023-05-18 DIAGNOSIS — M9903 Segmental and somatic dysfunction of lumbar region: Secondary | ICD-10-CM | POA: Diagnosis not present

## 2023-05-20 ENCOUNTER — Other Ambulatory Visit: Payer: Self-pay

## 2023-05-20 ENCOUNTER — Emergency Department (HOSPITAL_BASED_OUTPATIENT_CLINIC_OR_DEPARTMENT_OTHER): Payer: BC Managed Care – PPO

## 2023-05-20 ENCOUNTER — Encounter (HOSPITAL_BASED_OUTPATIENT_CLINIC_OR_DEPARTMENT_OTHER): Payer: Self-pay | Admitting: Emergency Medicine

## 2023-05-20 ENCOUNTER — Emergency Department (HOSPITAL_BASED_OUTPATIENT_CLINIC_OR_DEPARTMENT_OTHER)
Admission: EM | Admit: 2023-05-20 | Discharge: 2023-05-20 | Disposition: A | Discharge status: Home / Self Care | Payer: BC Managed Care – PPO | Attending: Emergency Medicine | Admitting: Emergency Medicine

## 2023-05-20 DIAGNOSIS — R079 Chest pain, unspecified: Secondary | ICD-10-CM | POA: Diagnosis not present

## 2023-05-20 DIAGNOSIS — R1011 Right upper quadrant pain: Secondary | ICD-10-CM

## 2023-05-20 DIAGNOSIS — Z853 Personal history of malignant neoplasm of breast: Secondary | ICD-10-CM | POA: Diagnosis not present

## 2023-05-20 DIAGNOSIS — M549 Dorsalgia, unspecified: Secondary | ICD-10-CM | POA: Diagnosis not present

## 2023-05-20 DIAGNOSIS — K8050 Calculus of bile duct without cholangitis or cholecystitis without obstruction: Secondary | ICD-10-CM | POA: Insufficient documentation

## 2023-05-20 DIAGNOSIS — R1013 Epigastric pain: Secondary | ICD-10-CM | POA: Diagnosis not present

## 2023-05-20 DIAGNOSIS — R0789 Other chest pain: Secondary | ICD-10-CM | POA: Diagnosis not present

## 2023-05-20 LAB — COMPREHENSIVE METABOLIC PANEL
ALT: 11 U/L (ref 0–44)
AST: 14 U/L — ABNORMAL LOW (ref 15–41)
Albumin: 4 g/dL (ref 3.5–5.0)
Alkaline Phosphatase: 40 U/L (ref 38–126)
Anion gap: 9 (ref 5–15)
BUN: 13 mg/dL (ref 6–20)
CO2: 22 mmol/L (ref 22–32)
Calcium: 8.2 mg/dL — ABNORMAL LOW (ref 8.9–10.3)
Chloride: 108 mmol/L (ref 98–111)
Creatinine, Ser: 0.7 mg/dL (ref 0.44–1.00)
GFR, Estimated: >60 mL/min (ref >60)
Glucose, Bld: 91 mg/dL (ref 70–99)
Potassium: 3.9 mmol/L (ref 3.5–5.1)
Sodium: 139 mmol/L (ref 135–145)
Total Bilirubin: 0.5 mg/dL (ref 0.3–1.2)
Total Protein: 6.5 g/dL (ref 6.5–8.1)

## 2023-05-20 LAB — LIPASE, BLOOD: Lipase: 12 U/L (ref 11–51)

## 2023-05-20 LAB — URINALYSIS, ROUTINE W REFLEX MICROSCOPIC
Bilirubin Urine: NEGATIVE
Glucose, UA: NEGATIVE mg/dL
Hgb urine dipstick: NEGATIVE
Ketones, ur: NEGATIVE mg/dL
Leukocytes,Ua: NEGATIVE
Nitrite: NEGATIVE
Protein, ur: NEGATIVE mg/dL
Specific Gravity, Urine: 1.01 (ref 1.005–1.030)
pH: 7 (ref 5.0–8.0)

## 2023-05-20 LAB — CBC
HCT: 42.4 % (ref 36.0–46.0)
Hemoglobin: 14.4 g/dL (ref 12.0–15.0)
MCH: 32.2 pg (ref 26.0–34.0)
MCHC: 34 g/dL (ref 30.0–36.0)
MCV: 94.9 fL (ref 80.0–100.0)
Platelets: 293 10*3/uL (ref 150–400)
RBC: 4.47 MIL/uL (ref 3.87–5.11)
RDW: 12.1 % (ref 11.5–15.5)
WBC: 4.8 10*3/uL (ref 4.0–10.5)
nRBC: 0 % (ref 0.0–0.2)

## 2023-05-20 LAB — PREGNANCY, URINE: Preg Test, Ur: NEGATIVE

## 2023-05-20 MED ORDER — OXYCODONE HCL 5 MG PO TABS
2.5000 mg | ORAL_TABLET | Freq: Four times a day (QID) | ORAL | 0 refills | Status: DC | PRN
Start: 2023-05-20 — End: 2023-06-27

## 2023-05-20 MED ORDER — HYOSCYAMINE SULFATE 0.125 MG PO TABS: 0.1250 mg | ORAL_TABLET | ORAL | 0 refills | Status: DC | PRN

## 2023-05-20 MED ORDER — HYDROMORPHONE HCL 1 MG/ML IJ SOLN
0.5000 mg | Freq: Once | INTRAMUSCULAR | Status: DC
  Filled 2023-05-20: qty 1

## 2023-05-20 MED ORDER — CELECOXIB 200 MG PO CAPS: 200.0000 mg | ORAL_CAPSULE | Freq: Two times a day (BID) | ORAL | 0 refills | Status: DC

## 2023-05-20 MED ORDER — FENTANYL CITRATE PF 50 MCG/ML IJ SOSY
50.0000 ug | PREFILLED_SYRINGE | Freq: Once | INTRAMUSCULAR | Status: AC
  Administered 2023-05-20: 50 ug via INTRAVENOUS
  Filled 2023-05-20: qty 1

## 2023-05-20 MED ORDER — LIDOCAINE VISCOUS HCL 2 % MT SOLN
15.0000 mL | Freq: Once | OROMUCOSAL | Status: AC
  Administered 2023-05-20: 15 mL via OROMUCOSAL
  Filled 2023-05-20: qty 15

## 2023-05-20 MED ORDER — ONDANSETRON HCL 4 MG/2ML IJ SOLN
4.0000 mg | Freq: Once | INTRAMUSCULAR | Status: AC
  Administered 2023-05-20: 4 mg via INTRAVENOUS
  Filled 2023-05-20: qty 2

## 2023-05-20 MED ORDER — ONDANSETRON 4 MG PO TBDP: 4.0000 mg | ORAL_TABLET | Freq: Three times a day (TID) | ORAL | 0 refills | Status: DC | PRN

## 2023-05-20 MED ORDER — ALUM & MAG HYDROXIDE-SIMETH 200-200-20 MG/5ML PO SUSP
30.0000 mL | Freq: Once | ORAL | Status: AC
  Administered 2023-05-20: 30 mL via ORAL
  Filled 2023-05-20: qty 30

## 2023-05-20 MED ORDER — HYOSCYAMINE SULFATE 0.125 MG SL SUBL
0.2500 mg | SUBLINGUAL_TABLET | Freq: Once | SUBLINGUAL | Status: AC
  Administered 2023-05-20: 0.25 mg via SUBLINGUAL
  Filled 2023-05-20: qty 2

## 2023-05-20 MED ORDER — KETOROLAC TROMETHAMINE 30 MG/ML IJ SOLN
30.0000 mg | Freq: Once | INTRAMUSCULAR | Status: AC
  Administered 2023-05-20: 30 mg via INTRAVENOUS
  Filled 2023-05-20: qty 1

## 2023-05-20 MED ORDER — SODIUM CHLORIDE 0.9 % IV BOLUS
1000.0000 mL | Freq: Once | INTRAVENOUS | Status: AC
  Administered 2023-05-20: 1000 mL via INTRAVENOUS

## 2023-05-20 MED ORDER — IOHEXOL 350 MG/ML SOLN
100.0000 mL | Freq: Once | INTRAVENOUS | Status: AC | PRN
  Administered 2023-05-20: 75 mL via INTRAVENOUS

## 2023-05-20 NOTE — ED Provider Notes (Signed)
[place] EMERGENCY DEPARTMENT AT [facility] Provider Note   CSN: [id number] Arrival date & time: 05/20/23  1020     History  No chief complaint on file.   Allison Mcclure is a 47 y.o. female who presents with epigastric a RUQ abdominal pain. Patient seen in the ED for Right upper back pain 6 days ago. Pain never went away and is now localized to the RUQ of the abdomen. SHe has been seen int eh past by GI for epigastric pain bloating nad heartburn unrelieved by her regular PPI. Patient has a hx of GERD, hiatal hernia, IBS and breast Cancer. Patient states that she  noticed pulsating pain in her abdomen which made her concerneed she could have a AAA.  HPI     Home Medications Prior to Admission medications   Medication Sig Start Date End Date Taking? Authorizing Provider  cetirizine HCl (ZYRTEC) 5 MG/5ML SOLN Take 5 mLs (5 mg total) by mouth daily. 05/06/19   Serena Croissant, MD  cholecalciferol (VITAMIN D) 25 MCG (1000 UT) tablet Take 1 tablet (1,000 Units total) by mouth daily. 05/06/19   Serena Croissant, MD  cyclobenzaprine (FLEXERIL) 10 MG tablet Take 1 tablet (10 mg total) by mouth 2 (two) times daily as needed for muscle spasms. 05/13/23   Jeanelle Malling, PA  ibuprofen (ADVIL) 800 MG tablet Take 800 mg by mouth as needed. 04/12/23   [provider]  loratadine (ALAVERT) 10 MG dissolvable tablet Take 10 mg by mouth daily.    [provider]  Menaquinone-7 (VITAMIN K2 PO) Take 1 capsule by mouth daily.    [provider]  metoprolol succinate (TOPROL-XL) 25 MG 24 hr tablet Take 1 tablet (25 mg total) by mouth 2 (two) times daily. 11/13/22   O'NealRonnald Ramp, MD  naproxen (NAPROSYN) 500 MG tablet Take 1 tablet (500 mg total) by mouth 2 (two) times daily with a meal. As needed for pain 01/30/23   Copland, Alicia B, PA-C  naproxen (NAPROSYN) 500 MG tablet Take 1 tablet (500 mg total) by mouth 2 (two) times daily. 05/13/23   Jeanelle Malling, PA  omeprazole (PRILOSEC)  20 MG capsule Take 1 capsule (20 mg total) by mouth 2 (two) times daily before a meal. 01/12/23   Lemmon, Violet Baldy, PA  Propylene Glycol (SYSTANE BALANCE) 0.6 % SOLN Place 1 drop into both eyes as needed.    [provider]  triamcinolone (NASACORT ALLERGY 24HR) 55 MCG/ACT AERO nasal inhaler Place 1 spray into the nose as needed.    [provider]      Allergies    Sunscreen spf30 [solbar pf spf15] and Cephalexin    Review of Systems   Review of Systems  Physical Exam Updated Vital Signs BP 132/75   Pulse 70   Temp 98.5 F (36.9 C) (Oral)   Resp 20   LMP 04/22/2023   SpO2 100%  Physical Exam Vitals and nursing note reviewed.  Constitutional:      General: She is not in acute distress.    Appearance: She is well-developed. She is not diaphoretic.  HENT:     Head: Normocephalic and atraumatic.     Right Ear: External ear normal.     Left Ear: External ear normal.     Nose: Nose normal.     Mouth/Throat:     Mouth: Mucous membranes are moist.  Eyes:     General: No scleral icterus.    Conjunctiva/sclera: Conjunctivae normal.  Cardiovascular:  Rate and Rhythm: Normal rate and regular rhythm.     Heart sounds: Normal heart sounds. No murmur heard.    No friction rub. No gallop.  Pulmonary:     Effort: Pulmonary effort is normal. No respiratory distress.     Breath sounds: Normal breath sounds.  Abdominal:     General: Bowel sounds are normal. There is no distension.     Palpations: Abdomen is soft. There is no mass.     Tenderness: There is abdominal tenderness in the right upper quadrant. There is guarding.    Musculoskeletal:     Cervical back: Normal range of motion.  Skin:    General: Skin is warm and dry.  Neurological:     Mental Status: She is alert and oriented to person, place, and time.  Psychiatric:        Behavior: Behavior normal.     ED Results / Procedures / Treatments   Labs (all labs ordered are listed, but only  abnormal results are displayed) Labs Reviewed  LIPASE, BLOOD  COMPREHENSIVE METABOLIC PANEL  CBC  URINALYSIS, ROUTINE W REFLEX MICROSCOPIC  PREGNANCY, URINE    EKG None  Radiology No results found.  Procedures Procedures    Medications Ordered in ED Medications  sodium chloride 0.9 % bolus 1,000 mL (has no administration in time range)  hyoscyamine (LEVSIN SL) SL tablet 0.25 mg (has no administration in time range)  fentaNYL (SUBLIMAZE) injection 50 mcg (has no administration in time range)  ondansetron (ZOFRAN) injection 4 mg (has no administration in time range)    ED Course/ Medical Decision Making/ A&P Clinical Course as of 05/20/23 1840  Sun May 20, 2023  1406 US Abdomen Limited RUQ (LIVER/GB) Patient with right upper quadrant domino ultrasound.  I visualized and interpreted results which show no acute findings.  Will proceed with CT angio and CT chest. [AH]  1407 Urinalysis, Routine w reflex microscopic -Urine, Clean Catch [AH]  1407 Pregnancy, urine [AH]  1407 Comprehensive metabolic panel(!) [AH]  1432 CBC [AH]  1432 CT Angio Chest PE W and/or Wo Contrast [AH]  1432 CT ABDOMEN PELVIS W CONTRAST I visualized and interpreted CT abdomen and pelvis, CT angiogram of the chest.  There is no evidence of acute finding including no evidence of lung infarct or pulmonary embolus. [AH]    Clinical Course User Index [AH] Arthor Captain, PA-C                                 Medical Decision Making Amount and/or Complexity of Data Reviewed Labs: ordered. Decision-making details documented in ED Course. Radiology: ordered. Decision-making details documented in ED Course.  Risk OTC drugs. Prescription drug management.  This patient presents to the ED for concern of right upper back pain and right upper quadrant abdominal pain, this involves an extensive number of treatment options, and is a complaint that carries with it a high risk of complications and morbidity.  The  emergent DDX for RUQ pain includes but is not limited to Glabladder disease, PUD, Acute Hepatitis, Pancreatitis, pyelonephritis, Pneumonia, Lower lobe PE/Infarct, Kidney stone, GERD, retrocecal appendicitis, Fitz-Hugh-Curtis syndrome, AAA, MI, Zoster.   Co morbidities:  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 +, Her2 neu -; neg lymph nodes; s/p lumpectomy, radiation, attempted tamoxifen  with side effects   Cervical dysplasia    Gastrointestinal disorder    GERD (gastroesophageal reflux disease)    Heart palpitations    IBS (irritable bowel syndrome)    Ovarian cyst      Social Determinants of Health:   SDOH Screenings   Physical Activity: Unknown (12/05/2017)  Social Connections: Unknown (12/05/2017)  Stress: Stress Concern Present (12/05/2017)  Tobacco Use: Low Risk  (05/20/2023)     Additional history:  {Additional history obtained from review of EMR and husband at bedside   Lab Tests:  I Ordered, and personally interpreted labs.  The pertinent results include: CBC CMP and lipase without significant finding  Imaging Studies:  I ordered imaging studies including right upper quadrant ultrasound and CT angiogram of the chest along with CT abdomen and pelvis I independently visualized and interpreted imaging which showed no acute findings I agree with the radiologist interpretation  Cardiac Monitoring/ECG:  The patient was maintained on a cardiac monitor.  I personally viewed and interpreted the cardiac monitored which showed an underlying rhythm of: Sinus rhythm  Medicines ordered and prescription drug management:  I ordered medication including  Medications  HYDROmorphone (DILAUDID) injection 0.5 mg (0.5 mg Intravenous Not Given 05/20/23 1247)  sodium chloride 0.9 % bolus 1,000 mL (1,000 mLs Intravenous New Bag/Given 05/20/23 1114)  hyoscyamine (LEVSIN SL) SL tablet 0.25 mg  (0.25 mg Sublingual Given 05/20/23 1110)  fentaNYL (SUBLIMAZE) injection 50 mcg (50 mcg Intravenous Given 05/20/23 1115)  ondansetron (ZOFRAN) injection 4 mg (4 mg Intravenous Given 05/20/23 1115)  sodium chloride 0.9 % bolus 1,000 mL (1,000 mLs Intravenous New Bag/Given 05/20/23 1320)  fentaNYL (SUBLIMAZE) injection 50 mcg (50 mcg Intravenous Given 05/20/23 1321)  iohexol (OMNIPAQUE) 350 MG/ML injection 100 mL (75 mLs Intravenous Contrast Given 05/20/23 1340)  alum & mag hydroxide-simeth (MAALOX/MYLANTA) 200-200-20 MG/5ML suspension 30 mL (30 mLs Oral Given 05/20/23 1510)  lidocaine (XYLOCAINE) 2 % viscous mouth solution 15 mL (15 mLs Mouth/Throat Given 05/20/23 1511)  ketorolac (TORADOL) 30 MG/ML injection 30 mg (30 mg Intravenous Given 05/20/23 1512)   for pain control Reevaluation of the patient after these medicines showed that the patient stayed the same I have reviewed the patients home medicines and have made adjustments as needed  Test Considered:   HIDA scan  Critical Interventions:    Consultations Obtained: Discussed the case with Dr. Dossie Der T.  He will order an outpatient HIDA scan for the patient and have her follow-up in their office for suspected biliary colic  Problem List / ED Course:     ICD-10-CM   1. RUQ abdominal pain  R10.11     2. Biliary colic symptom  K80.50       MDM: Patient here with right upper back and right upper abdominal quadrant pain.  Suspect biliary colic.  She certainly fits the risk factor profile for such.  She had significant pain control problems and I was unable to really achieve good pain control for the patient.  PDMP reviewed and meds given at discharge.  Will need close outpatient follow-up and further evaluation, no signs or symptoms of acute finding.  I have low suspicion for ovarian torsion.  She has no pelvic pain or vaginal symptoms.  Patient appears otherwise appropriate for discharge at this time after extensive  evaluation.   Dispostion:  After consideration of the diagnostic results and the patients response to treatment, I feel that the patent would benefit from discharge.  Final Clinical Impression(s) / ED Diagnoses Final diagnoses:  None    Rx / DC Orders ED Discharge Orders     None         Arthor Captain, PA-C 05/24/23 1620    Tanda Rockers A, DO 05/26/23 [phone]

## 2023-05-20 NOTE — Discharge Instructions (Addendum)
Stop Taking your Naproxen. You may take Celebrex instead which should not affect the lining of your stomach.  Get help right away if: Your skin or the whites of your eyes look yellow (jaundice). Your have tea-colored urine and light-colored stools (feces). You are dizzy or you faint.

## 2023-05-20 NOTE — ED Triage Notes (Signed)
Pt was here about a week ago for back pain, which was treated as muscular but meds havent really helped. She now still has back pain and right epigastric/lower abd pain intermittent stabbing pains. She states felt a pulsating this am lower abdomen. Lbm today.

## 2023-05-20 NOTE — ED Notes (Signed)
Dc instructions reviewed with patient. Patient voiced understanding. Dc with belongings.  °

## 2023-05-23 ENCOUNTER — Other Ambulatory Visit: Payer: Self-pay | Admitting: Physician Assistant

## 2023-05-23 DIAGNOSIS — R1011 Right upper quadrant pain: Secondary | ICD-10-CM | POA: Diagnosis not present

## 2023-05-28 ENCOUNTER — Other Ambulatory Visit (HOSPITAL_COMMUNITY): Payer: Self-pay | Admitting: General Surgery

## 2023-05-28 DIAGNOSIS — R1011 Right upper quadrant pain: Secondary | ICD-10-CM

## 2023-05-30 ENCOUNTER — Encounter (HOSPITAL_COMMUNITY)
Admission: RE | Admit: 2023-05-30 | Discharge: 2023-05-30 | Disposition: A | Discharge status: Home / Self Care | Payer: BC Managed Care – PPO | Source: Ambulatory Visit | Attending: General Surgery | Admitting: General Surgery

## 2023-05-30 DIAGNOSIS — R1011 Right upper quadrant pain: Secondary | ICD-10-CM | POA: Diagnosis not present

## 2023-05-30 DIAGNOSIS — R109 Unspecified abdominal pain: Secondary | ICD-10-CM | POA: Diagnosis not present

## 2023-05-30 MED ORDER — TECHNETIUM TC 99M MEBROFENIN IV KIT: 5.0000 | PACK | Freq: Once | INTRAVENOUS | Status: DC | PRN

## 2023-05-30 MED ORDER — TECHNETIUM TC 99M MEBROFENIN IV KIT
5.0000 | PACK | Freq: Once | INTRAVENOUS | Status: AC | PRN
  Administered 2023-05-30: 5 via INTRAVENOUS

## 2023-06-08 ENCOUNTER — Telehealth: Payer: Self-pay | Admitting: Physician Assistant

## 2023-06-08 NOTE — Telephone Encounter (Signed)
Inbound call from patient stating she is trying to set up breath test. Patient stated she received a number to call. States she she calls the number there is no answer and she has not heard anything. Patient is unsure if she has the wrong phone number. Patient requesting a call back to discuss further. Please advise, thank you.

## 2023-06-08 NOTE — Telephone Encounter (Signed)
Called patient and let her know that [facility] did have her paperwork for the SIBO test and that they stated she could follow instruction in the box and call if she had questions. Patient stated understanding and had no questions at the end of call.

## 2023-06-08 NOTE — Telephone Encounter (Signed)
Looks like you were with Allison Mcclure this day.

## 2023-06-20 NOTE — Progress Notes (Signed)
Allison Mcclure is a 48 y.o. female here for a new patient visit.  History of Present Illness:   Chief Complaint  Patient presents with   Establish Care   Insomnia    Pt having trouble sleeping off and on, waking up a lot lately.    HPI  Allergies; Insomnia Treated with cetirizine 5 mg daily. Reports she has insomnia when she does not take this. Does not take OTC sleep aids. Denies nocturia.   Anxiety  History of panic attacks in crowded areas. Previously took alprazolam. Denies taking prescription insomnia medication in the past.  Abdominal Pain Seen in ED on 05/13/23 and 05/20/23 for posterior abdominal pain. On 05/13/23 she was given a lidocaine patch, Percocet/Roxicet and discharged with Flexeril, Naprosyn. Pain did not resolve and started to be localized in the RUQ. 05/20/23 [place] RUQ abdomen showed no acute findings. 05/20/23 CT abdomen showed no evidence of acute findings, including no evidence of lung infarct or pulmonary embolus. On 05/20/23 visit she was given IV fluids, Levsin oral, fentanyl IM, Zofran IM. History of abdominal pain but has not had pain in the posterior abdomen like this before. 05/30/23 nuclear medicine hepatobiliary showed patent cystic and common bile ducts, normal gallbladder ejection fraction. 10/10/21 EGD showed 2 cm hiatal hernia. 10/10/21 colonoscopy normal with 10 year repeat.  She recently had SIBO breath test -- this is pending.  History of breast cancer Diagnosed in 12/2015. Status post right lumpectomy 02/2016. Last mammogram was normal on 03/02/23 She has seen a Dentist.  PVC; Palpitations Treated with metoprolol succinate 25 mg daily. Palpitations are worse when eating and lying down. She feels palpitations daily. Followed by cardiologist, Dr. Lennie Odor.  Denies chest pain, shortness of breath, leg swelling. She is agreeable to doing coronary calcium scoring.  She has not had a consistent primary care physician in recent  years.  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 +, Her2 neu -; neg lymph nodes; s/p lumpectomy, radiation, attempted tamoxifen with side effects   Cervical dysplasia    Gastrointestinal disorder    GERD (gastroesophageal reflux disease)    Heart palpitations    IBS (irritable bowel syndrome)    Ovarian cyst      Social History   Tobacco Use   Smoking status: Never   Smokeless tobacco: Never  Vaping Use   Vaping status: Never Used  Substance Use Topics   Alcohol use: Yes    Alcohol/week: 2.0 - 7.0 standard drinks of alcohol    Types: 1 - 2 Glasses of wine, 1 - 5 Shots of liquor per week   Drug use: Never    Past Surgical History:  Procedure Laterality Date   arm surgery     left   BREAST LUMPECTOMY WITH RADIOACTIVE SEED AND SENTINEL LYMPH NODE BIOPSY Right 03/01/2016   Procedure: RIGHT BREAST LUMPECTOMY WITH RADIOACTIVE SEED AND SENTINEL LYMPH NODE BIOPSY;  Surgeon: Almond Lint, MD;  Location: [facility];  Service: General;  Laterality: Right;   COLONOSCOPY     CRYOTHERAPY     EXCISION MASS LOWER EXTREMETIES Right 03/01/2016   Procedure: EXCISION MASS RIGHT THIGH;  Surgeon: Almond Lint, MD;  Location: [facility];  Service: General;  Laterality: Right;   MOUTH SURGERY     "gum surgery" x 2   TONSILLECTOMY  WISDOM TOOTH EXTRACTION      Family History  Problem Relation Age of Onset   Hypertension Mother    Hyperlipidemia Mother    Hyperlipidemia Father    Prostate cancer Father    Hypertension Father    Heart attack Father    Other Sister        breast mastectomy   Hearing loss Brother    Heart failure Maternal Grandmother    Heart failure Maternal Grandfather    Bone cancer Paternal Grandfather    Prostate cancer Paternal Uncle    Ovarian cancer Cousin 33   Colon cancer Other        PGGF   Esophageal cancer Neg Hx    Rectal  cancer Neg Hx    Stomach cancer Neg Hx     Allergies  Allergen Reactions   Sunscreen Spf30 [Solbar Pf Spf15] Hives, Itching and Rash   Cephalexin Rash and Other (See Comments)    Current Medications:   Current Outpatient Medications:    cetirizine (ZYRTEC) 5 MG tablet, Take 5 mg by mouth daily., Disp: , Rfl:    Cholecalciferol (VITAMIN D) 125 MCG (5000 UT) CAPS, Take 1 capsule by mouth daily in the afternoon., Disp: , Rfl:    Menaquinone-7 (VITAMIN K2 PO), Take 1 capsule by mouth daily., Disp: , Rfl:    metoprolol succinate (TOPROL-XL) 25 MG 24 hr tablet, Take 1 tablet (25 mg total) by mouth 2 (two) times daily. (Patient taking differently: Take 25 mg by mouth daily.), Disp: 180 tablet, Rfl: 3   omeprazole (PRILOSEC) 20 MG capsule, TAKE 1 CAPSULE BY MOUTH EVERY DAY, Disp: 30 capsule, Rfl: 2   Propylene Glycol (SYSTANE BALANCE) 0.6 % SOLN, Place 1 drop into both eyes as needed., Disp: , Rfl:    triamcinolone (NASACORT ALLERGY 24HR) 55 MCG/ACT AERO nasal inhaler, Place 1 spray into the nose as needed., Disp: , Rfl:    Review of Systems:   Review of Systems  Constitutional:  Negative for fever and malaise/fatigue.  HENT:  Negative for congestion.   Eyes:  Negative for blurred vision.  Respiratory:  Negative for cough and shortness of breath.   Cardiovascular:  Positive for palpitations. Negative for chest pain and leg swelling.  Gastrointestinal:  Negative for vomiting.  Musculoskeletal:  Negative for back pain.  Skin:  Negative for rash.  Neurological:  Negative for loss of consciousness and headaches.    Vitals:   Vitals:   06/27/23 0910  BP: 102/70  Pulse: 63  Temp: (!) 97.5 F (36.4 C)  TempSrc: Temporal  SpO2: 98%  Weight: 139 lb 4 oz (63.2 kg)  Height: 5\' 2"  (1.575 m)     Body mass index is 25.47 kg/m.  Physical Exam:   Physical Exam Vitals and nursing note reviewed.  Constitutional:      General: She is not in acute distress.    Appearance: She is  well-developed. She is not ill-appearing or toxic-appearing.  Cardiovascular:     Rate and Rhythm: Normal rate and regular rhythm.     Pulses: Normal pulses.     Heart sounds: Normal heart sounds, S1 normal and S2 normal.  Pulmonary:     Effort: Pulmonary effort is normal.     Breath sounds: Normal breath sounds.  Skin:    General: Skin is warm and dry.  Neurological:     Mental Status: She is alert.     GCS: GCS eye subscore is 4. GCS verbal subscore is 5. GCS  motor subscore is 6.  Psychiatric:        Speech: Speech normal.        Behavior: Behavior normal. Behavior is cooperative.     Assessment and Plan:   Insomnia, unspecified type Will trial trazodone 25 mg -- may increase up to 100 mg if needed (instructions on AVS) Follow-up in 1-3 months for Comprehensive Physical Exam (CPE) preventive care annual visit, sooner if concerns  Anxiety Overall controlled per patient Continue to monitor  Hyperlipidemia, unspecified hyperlipidemia type Update blood work today as she is fasting - will discuss more in detail at Comprehensive Physical Exam (CPE) preventive care annual visit Likely would benefit from calcium score - dad had MI in 39's  History of breast cancer Reviewed  Palpitations Overall controlled with metoprolol  Abdominal pain, unspecified abdominal location Ongoing management with GI   I,Alexander Ruley,acting as a scribe for Jarold Motto, PA.,have documented all relevant documentation on the behalf of Jarold Motto, PA,as directed by  Jarold Motto, PA while in the presence of Jarold Motto, [place].   I, Jarold Motto, [place], have reviewed all documentation for this visit. The documentation on 06/27/23 for the exam, diagnosis, procedures, and orders are all accurate and complete.   Jarold Motto, PA-C

## 2023-06-21 DIAGNOSIS — R143 Flatulence: Secondary | ICD-10-CM | POA: Diagnosis not present

## 2023-06-21 DIAGNOSIS — R140 Abdominal distension (gaseous): Secondary | ICD-10-CM | POA: Diagnosis not present

## 2023-06-21 DIAGNOSIS — K219 Gastro-esophageal reflux disease without esophagitis: Secondary | ICD-10-CM | POA: Diagnosis not present

## 2023-06-21 DIAGNOSIS — R1013 Epigastric pain: Secondary | ICD-10-CM | POA: Diagnosis not present

## 2023-06-25 DIAGNOSIS — M9906 Segmental and somatic dysfunction of lower extremity: Secondary | ICD-10-CM | POA: Diagnosis not present

## 2023-06-25 DIAGNOSIS — M9903 Segmental and somatic dysfunction of lumbar region: Secondary | ICD-10-CM | POA: Diagnosis not present

## 2023-06-25 DIAGNOSIS — M9901 Segmental and somatic dysfunction of cervical region: Secondary | ICD-10-CM | POA: Diagnosis not present

## 2023-06-25 DIAGNOSIS — S46011A Strain of muscle(s) and tendon(s) of the rotator cuff of right shoulder, initial encounter: Secondary | ICD-10-CM | POA: Diagnosis not present

## 2023-06-27 ENCOUNTER — Ambulatory Visit: Payer: BC Managed Care – PPO | Admitting: Physician Assistant

## 2023-06-27 ENCOUNTER — Other Ambulatory Visit: Payer: Self-pay | Admitting: *Deleted

## 2023-06-27 ENCOUNTER — Other Ambulatory Visit: Payer: Self-pay | Admitting: Physician Assistant

## 2023-06-27 ENCOUNTER — Encounter: Payer: Self-pay | Admitting: Physician Assistant

## 2023-06-27 ENCOUNTER — Telehealth: Payer: Self-pay | Admitting: *Deleted

## 2023-06-27 VITALS — BP 102/70 | HR 63 | Temp 97.5°F | Ht 62.0 in | Wt 139.2 lb

## 2023-06-27 DIAGNOSIS — Z853 Personal history of malignant neoplasm of breast: Secondary | ICD-10-CM

## 2023-06-27 DIAGNOSIS — F419 Anxiety disorder, unspecified: Secondary | ICD-10-CM

## 2023-06-27 DIAGNOSIS — G4700 Insomnia, unspecified: Secondary | ICD-10-CM

## 2023-06-27 DIAGNOSIS — R002 Palpitations: Secondary | ICD-10-CM

## 2023-06-27 DIAGNOSIS — E785 Hyperlipidemia, unspecified: Secondary | ICD-10-CM | POA: Diagnosis not present

## 2023-06-27 DIAGNOSIS — K638219 Small intestinal bacterial overgrowth, unspecified: Secondary | ICD-10-CM

## 2023-06-27 DIAGNOSIS — R109 Unspecified abdominal pain: Secondary | ICD-10-CM

## 2023-06-27 LAB — COMPREHENSIVE METABOLIC PANEL WITH GFR
ALT: 11 U/L (ref 0–35)
AST: 14 U/L (ref 0–37)
Albumin: 4 g/dL (ref 3.5–5.2)
Alkaline Phosphatase: 44 U/L (ref 39–117)
BUN: 12 mg/dL (ref 6–23)
CO2: 27 meq/L (ref 19–32)
Calcium: 9 mg/dL (ref 8.4–10.5)
Chloride: 106 meq/L (ref 96–112)
Creatinine, Ser: 0.74 mg/dL (ref 0.40–1.20)
GFR: 95.8 mL/min
Glucose, Bld: 89 mg/dL (ref 70–99)
Potassium: 4.3 meq/L (ref 3.5–5.1)
Sodium: 138 meq/L (ref 135–145)
Total Bilirubin: 0.5 mg/dL (ref 0.2–1.2)
Total Protein: 6.1 g/dL (ref 6.0–8.3)

## 2023-06-27 LAB — CBC WITH DIFFERENTIAL/PLATELET
Basophils Absolute: 0 10*3/uL (ref 0.0–0.1)
Basophils Relative: 0.7 % (ref 0.0–3.0)
Eosinophils Absolute: 0.1 10*3/uL (ref 0.0–0.7)
Eosinophils Relative: 1.9 % (ref 0.0–5.0)
HCT: 43.4 % (ref 36.0–46.0)
Hemoglobin: 14.1 g/dL (ref 12.0–15.0)
Lymphocytes Relative: 24.3 % (ref 12.0–46.0)
Lymphs Abs: 1.3 10*3/uL (ref 0.7–4.0)
MCHC: 32.4 g/dL (ref 30.0–36.0)
MCV: 97 fL (ref 78.0–100.0)
Monocytes Absolute: 0.5 10*3/uL (ref 0.1–1.0)
Monocytes Relative: 8.4 % (ref 3.0–12.0)
Neutro Abs: 3.6 10*3/uL (ref 1.4–7.7)
Neutrophils Relative %: 64.7 % (ref 43.0–77.0)
Platelets: 270 10*3/uL (ref 150.0–400.0)
RBC: 4.47 Mil/uL (ref 3.87–5.11)
RDW: 12.7 % (ref 11.5–15.5)
WBC: 5.5 10*3/uL (ref 4.0–10.5)

## 2023-06-27 LAB — LIPID PANEL
Cholesterol: 209 mg/dL — ABNORMAL HIGH (ref 0–200)
HDL: 46.8 mg/dL
LDL Cholesterol: 134 mg/dL — ABNORMAL HIGH (ref 0–99)
NonHDL: 162.29
Total CHOL/HDL Ratio: 4
Triglycerides: 140 mg/dL (ref 0.0–149.0)
VLDL: 28 mg/dL (ref 0.0–40.0)

## 2023-06-27 MED ORDER — TRAZODONE HCL 50 MG PO TABS
25.0000 mg | ORAL_TABLET | Freq: Every evening | ORAL | 3 refills | Status: AC | PRN
Start: 2023-06-27 — End: ?

## 2023-06-27 MED ORDER — RIFAXIMIN 550 MG PO TABS
550.0000 mg | ORAL_TABLET | Freq: Three times a day (TID) | ORAL | 0 refills | Status: AC
Start: 2023-06-27 — End: 2023-07-11

## 2023-06-27 NOTE — Progress Notes (Signed)
06/27/2023 9:45 AM  Received results from patient recent SIBO testing.  This did show that bacterial overgrowth is suspected.  Will have my nurse call the patient.  Prescribed Xifaxan 550 mg 3 times daily x 14 days to see if this helps with her symptoms.  Hyacinth Meeker, PA-C

## 2023-06-27 NOTE — Telephone Encounter (Signed)
Called patient to inform of  the SIBO infection. Informed the patient of the treatment with Xifaxan 550 mg TID for 2 weeks and to call back to let us know how she is doing post treatment. Patient agreed.

## 2023-06-27 NOTE — Patient Instructions (Signed)
It was great to see you!  Consider changing up your allergy pill at night?  Let's trial 1/2 to full tablet of trazodone for your insomnia May increase to 2 tablets if needed May take as needed or daily  Let's follow-up in 1-3 months for Comprehensive Physical Exam (CPE) preventive care annual visit, sooner if you have concerns.  Take care,  Jarold Motto PA-C

## 2023-06-27 NOTE — Telephone Encounter (Signed)
-----   Message from Unk Lightning sent at 06/27/2023  9:44 AM EDT ----- Regarding: SIBO Can you let patient know that her breath test results were positive for bacterial overgrowth.  I would like to treat with Xifaxan 550 3 times daily x 14 days #42 no refills  Please have patient call back in when she is done with this so that we can see how she is feeling.  Thanks, JL L.

## 2023-07-09 DIAGNOSIS — M9901 Segmental and somatic dysfunction of cervical region: Secondary | ICD-10-CM | POA: Diagnosis not present

## 2023-07-09 DIAGNOSIS — M9903 Segmental and somatic dysfunction of lumbar region: Secondary | ICD-10-CM | POA: Diagnosis not present

## 2023-07-09 DIAGNOSIS — S46011A Strain of muscle(s) and tendon(s) of the rotator cuff of right shoulder, initial encounter: Secondary | ICD-10-CM | POA: Diagnosis not present

## 2023-07-09 DIAGNOSIS — M9906 Segmental and somatic dysfunction of lower extremity: Secondary | ICD-10-CM | POA: Diagnosis not present

## 2023-07-18 ENCOUNTER — Encounter: Payer: Self-pay | Admitting: Physician Assistant

## 2023-07-18 ENCOUNTER — Ambulatory Visit: Payer: BC Managed Care – PPO | Admitting: Physician Assistant

## 2023-07-18 VITALS — BP 94/60 | HR 53 | Ht 62.0 in | Wt 141.0 lb

## 2023-07-18 DIAGNOSIS — K638219 Small intestinal bacterial overgrowth, unspecified: Secondary | ICD-10-CM

## 2023-07-18 DIAGNOSIS — K219 Gastro-esophageal reflux disease without esophagitis: Secondary | ICD-10-CM

## 2023-07-18 DIAGNOSIS — R1013 Epigastric pain: Secondary | ICD-10-CM | POA: Diagnosis not present

## 2023-07-18 DIAGNOSIS — R140 Abdominal distension (gaseous): Secondary | ICD-10-CM

## 2023-07-18 NOTE — Progress Notes (Signed)
Chief Complaint: Follow-up bloating and gas  HPI:    Allison Mcclure is a 47 year old female, known to Dr. Rhea Belton, with a past medical history as listed below including reflux and IBS, who presents to clinic today for follow-up of bloating and gas.     10/10/2021 EGD and colonoscopy. Colonoscopy was normal. Repeat recommended in 10 years. EGD with 2 cm hiatal hernia and otherwise normal. Patient continued on Omeprazole 20 mg daily. Biopsies with mild reactive changes.     01/12/2023 patient seen in clinic and at that time was having some increase in epigastric pain.  Told her to increase Omeprazole to 20 mg twice daily for a little while and then go back to her normal dosage.  Also discussed possible musculoskeletal overlap given her pendulous breasts.    04/13/2023 office visit me.  At that time discussed increased bloating and gas.  This initially started after trying a Colostrum supplement.  GERD and epigastric pain are better with Omeprazole 20 mg daily.  That time tested for SIBO.  Recommend she limit caffeinated beverages and drinking with straws/chewing gum.  Encouraged her to try Gas-X with meals.    05/20/2023 CTAP with no acute findings.    05/20/2023 right upper quadrant ultrasound normal.    05/30/2023 HIDA scan with patent cystic and common bile ducts and normal gallbladder ejection fraction.    06/27/23 breath test results show that she was positive for bacterial overgrowth.  She was given Xifaxan 550 3 times daily x 14 days.    06/27/2023 CBC and CMP normal.    Today, patient presents to clinic and tells me that she flew out to visit her sister over the weekend and she finished her Rifaximin and then.  She has not really noticed that this has helped her GI symptoms that she was eating food she normally does not and drinking slightly more alcohol than normal while there and did have some GI upset including some looser stools around that time and had taken a couple of Pepto-Bismol.  She has only  been back in town 2 or 3 days and is just really not sure if the Rifaximin helped or not.  She has questions about SIBO.    Briefly discussed his visit to the ER as above for gallbladder issues.    Denies fever, chills or weight loss.  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 +, Her2 neu -; neg lymph nodes; s/p lumpectomy, radiation, attempted tamoxifen with side effects   Cervical dysplasia    Gastrointestinal disorder    GERD (gastroesophageal reflux disease)    Heart palpitations    IBS (irritable bowel syndrome)    Ovarian cyst     Past Surgical History:  Procedure Laterality Date   arm surgery     left   BREAST LUMPECTOMY WITH RADIOACTIVE SEED AND SENTINEL LYMPH NODE BIOPSY Right 03/01/2016   Procedure: RIGHT BREAST LUMPECTOMY WITH RADIOACTIVE SEED AND SENTINEL LYMPH NODE BIOPSY;  Surgeon: Almond Lint, MD;  Location: [facility];  Service: General;  Laterality: Right;   COLONOSCOPY     CRYOTHERAPY     EXCISION MASS LOWER EXTREMETIES Right 03/01/2016   Procedure: EXCISION MASS RIGHT THIGH;  Surgeon: Almond Lint, MD;  Location:  SURGERY CENTER;  Service: General;  Laterality: Right;   MOUTH SURGERY     "gum  surgery" x 2   TONSILLECTOMY     WISDOM TOOTH EXTRACTION      Current Outpatient Medications  Medication Sig Dispense Refill   cetirizine (ZYRTEC) 5 MG tablet Take 5 mg by mouth daily.     Cholecalciferol (VITAMIN D) 125 MCG (5000 UT) CAPS Take 1 capsule by mouth daily in the afternoon.     Menaquinone-7 (VITAMIN K2 PO) Take 1 capsule by mouth daily.     metoprolol succinate (TOPROL-XL) 25 MG 24 hr tablet Take 1 tablet (25 mg total) by mouth 2 (two) times daily. (Patient taking differently: Take 25 mg by mouth daily.) 180 tablet 3   omeprazole (PRILOSEC) 20 MG capsule TAKE 1 CAPSULE BY MOUTH EVERY DAY 30 capsule 2   Propylene Glycol (SYSTANE BALANCE)  0.6 % SOLN Place 1 drop into both eyes as needed.     traZODone (DESYREL) 50 MG tablet Take 0.5-1 tablets (25-50 mg total) by mouth at bedtime as needed for sleep. 30 tablet 3   triamcinolone (NASACORT ALLERGY 24HR) 55 MCG/ACT AERO nasal inhaler Place 1 spray into the nose as needed.     No current facility-administered medications for this visit.    Allergies as of 07/18/2023 - Review Complete 06/27/2023  Allergen Reaction Noted   Sunscreen spf30 [solbar pf spf15] Hives, Itching, and Rash 08/21/2016   Cephalexin Rash and Other (See Comments) 10/30/2016    Family History  Problem Relation Age of Onset   Hypertension Mother    Hyperlipidemia Mother    Hyperlipidemia Father    Prostate cancer Father    Hypertension Father    Heart attack Father        likely 41's   Other Sister        breast mastectomy   Hearing loss Brother    Heart failure Maternal Grandmother    Heart failure Maternal Grandfather    Bone cancer Paternal Grandfather    Prostate cancer Paternal Uncle    Ovarian cancer Cousin 33   Colon cancer Other        PGGF   Esophageal cancer Neg Hx    Rectal cancer Neg Hx    Stomach cancer Neg Hx     Social History   Socioeconomic History   Marital status: Single    Spouse name: Not on file   Number of children: 0   Years of education: Not on file   Highest education level: Not on file  Occupational History   Occupation: Investment banker, corporate  Tobacco Use   Smoking status: Never   Smokeless tobacco: Never  Vaping Use   Vaping status: Never Used  Substance and Sexual Activity   Alcohol use: Yes    Alcohol/week: 2.0 - 7.0 standard drinks of alcohol    Types: 1 - 2 Glasses of wine, 1 - 5 Shots of liquor per week   Drug use: Never   Sexual activity: Yes    Birth control/protection: None  Other Topics Concern   Not on file  Social History Narrative   Property Management    Lives with fiance, history of divorce   Social Determinants of Health   Financial  Resource Strain: Not on file  Food Insecurity: Not on file  Transportation Needs: Not on file  Physical Activity: Unknown (12/05/2017)   Exercise Vital Sign    Days of Exercise per Week: 0 days    Minutes of Exercise per Session: Not on file  Stress: Stress Concern Present (12/05/2017)   [employer] of Occupational  Health - Occupational Stress Questionnaire    Feeling of Stress : To some extent  Social Connections: Unknown (12/05/2017)   Social Connection and Isolation Panel [NHANES]    Frequency of Communication with Friends and Family: Not on file    Frequency of Social Gatherings with Friends and Family: Not on file    Attends Religious Services: Not on file    Active Member of Clubs or Organizations: Not on file    Attends Banker Meetings: Not on file    Marital Status: Living with partner  Intimate Partner Violence: Not At Risk (12/05/2017)   Humiliation, Afraid, Rape, and Kick questionnaire    Fear of Current or Ex-Partner: No    Emotionally Abused: No    Physically Abused: No    Sexually Abused: No    Review of Systems:    Constitutional: No weight loss, fever or chills Cardiovascular: No chest pain Respiratory: No SOB  Gastrointestinal: See HPI and otherwise negative   Physical Exam:  Vital signs: BP 94/60   Pulse (!) 53   Ht 5\' 2"  (1.575 m)   Wt 141 lb (64 kg)   LMP 07/05/2023 (Exact Date)   BMI 25.79 kg/m    Constitutional:   Pleasant Caucasian female appears to be in NAD, Well developed, Well nourished, alert and cooperative Respiratory: Respirations even and unlabored. Lungs clear to auscultation bilaterally.   No wheezes, crackles, or rhonchi.  Cardiovascular: Normal S1, S2. No MRG. Regular rate and rhythm. No peripheral edema, cyanosis or pallor.  Gastrointestinal:  Soft, nondistended, nontender. No rebound or guarding. Normal bowel sounds. No appreciable masses or hepatomegaly. Psychiatric: Oriented to person, place and time. Demonstrates  good judgement and reason without abnormal affect or behaviors.  RELEVANT LABS AND IMAGING: CBC    Component Value Date/Time   WBC 5.5 06/27/2023 0955   RBC 4.47 06/27/2023 0955   HGB 14.1 06/27/2023 0955   HGB 14.8 10/23/2016 1208   HCT 43.4 06/27/2023 0955   HCT 44.2 10/23/2016 1208   PLT 270.0 06/27/2023 0955   PLT 239 10/23/2016 1208   MCV 97.0 06/27/2023 0955   MCV 95.7 10/23/2016 1208   MCH 32.2 05/20/2023 1042   MCHC 32.4 06/27/2023 0955   RDW 12.7 06/27/2023 0955   RDW 12.2 10/23/2016 1208   LYMPHSABS 1.3 06/27/2023 0955   LYMPHSABS 1.0 10/23/2016 1208   MONOABS 0.5 06/27/2023 0955   MONOABS 0.6 10/23/2016 1208   EOSABS 0.1 06/27/2023 0955   EOSABS 0.3 10/23/2016 1208   BASOSABS 0.0 06/27/2023 0955   BASOSABS 0.0 10/23/2016 1208    CMP     Component Value Date/Time   NA 138 06/27/2023 0955   NA 140 01/19/2016 1227   K 4.3 06/27/2023 0955   K 3.7 01/19/2016 1227   CL 106 06/27/2023 0955   CO2 27 06/27/2023 0955   CO2 23 01/19/2016 1227   GLUCOSE 89 06/27/2023 0955   GLUCOSE 99 01/19/2016 1227   BUN 12 06/27/2023 0955   BUN 15.1 01/19/2016 1227   CREATININE 0.74 06/27/2023 0955   CREATININE 0.9 01/19/2016 1227   CALCIUM 9.0 06/27/2023 0955   CALCIUM 9.2 01/19/2016 1227   PROT 6.1 06/27/2023 0955   PROT 6.6 01/19/2016 1227   ALBUMIN 4.0 06/27/2023 0955   ALBUMIN 3.7 01/19/2016 1227   AST 14 06/27/2023 0955   AST 13 01/19/2016 1227   ALT 11 06/27/2023 0955   ALT 12 01/19/2016 1227   ALKPHOS 44 06/27/2023 0955   ALKPHOS  55 01/19/2016 1227   BILITOT 0.5 06/27/2023 0955   BILITOT 0.36 01/19/2016 1227   GFRNONAA >60 05/20/2023 1042   GFRAA >60 07/06/2018 1430    Assessment: 1.  Bloating and gas: Diagnosed with SIBO and recently finished treatment with Rifaximin a few days ago, uncertain if this has helped her symptoms or not 2.  Epigastric pain/GERD: Better on Omeprazole 20 mg daily  Plan: 1.  Discussed the diagnosis of SIBO and recommendations  going forward.  Explained that we can retreat her for SIBO 2-3 times a year if needed.  Primary symptoms to be bloating and gas. 2.  For now we will hold on anything else with the patient as she is just not really sure if this medicine helped or not and would like to see how she feels once she has had some time back at home. 3.  Patient to follow in clinic with me in January/February, if doing well she does not need that appointment.  Allison Meeker, PA-C [facility] 07/18/2023, 10:41 AM  Cc: No ref. provider found

## 2023-07-18 NOTE — Patient Instructions (Signed)
_______________________________________________________  If your blood pressure at your visit was 140/90 or greater, please contact your primary care physician to follow up on this.  _______________________________________________________  If you are age 48 or older, your body mass index should be between 23-30. Your Body mass index is 25.79 kg/m. If this is out of the aforementioned range listed, please consider follow up with your Primary Care Provider.  If you are age 31 or younger, your body mass index should be between 19-25. Your Body mass index is 25.79 kg/m. If this is out of the aformentioned range listed, please consider follow up with your Primary Care Provider.   ________________________________________________________  The [facility] providers would like to encourage you to use [facility] to communicate with providers for non-urgent requests or questions.  Due to long hold times on the telephone, sending your provider a message by [facility] may be a faster and more efficient way to get a response.  Please allow 48 business hours for a response.  Please remember that this is for non-urgent requests.  _______________________________________________________  I appreciate the opportunity to care for you. Hyacinth Meeker PA-C

## 2023-07-19 NOTE — Progress Notes (Signed)
Addendum: Reviewed and agree with assessment and management plan. Kadijah Shamoon M, MD  

## 2023-07-23 DIAGNOSIS — M9903 Segmental and somatic dysfunction of lumbar region: Secondary | ICD-10-CM | POA: Diagnosis not present

## 2023-07-23 DIAGNOSIS — M9906 Segmental and somatic dysfunction of lower extremity: Secondary | ICD-10-CM | POA: Diagnosis not present

## 2023-07-23 DIAGNOSIS — S46011A Strain of muscle(s) and tendon(s) of the rotator cuff of right shoulder, initial encounter: Secondary | ICD-10-CM | POA: Diagnosis not present

## 2023-07-23 DIAGNOSIS — M9901 Segmental and somatic dysfunction of cervical region: Secondary | ICD-10-CM | POA: Diagnosis not present

## 2023-08-06 DIAGNOSIS — M9901 Segmental and somatic dysfunction of cervical region: Secondary | ICD-10-CM | POA: Diagnosis not present

## 2023-08-06 DIAGNOSIS — M9903 Segmental and somatic dysfunction of lumbar region: Secondary | ICD-10-CM | POA: Diagnosis not present

## 2023-08-06 DIAGNOSIS — S46011A Strain of muscle(s) and tendon(s) of the rotator cuff of right shoulder, initial encounter: Secondary | ICD-10-CM | POA: Diagnosis not present

## 2023-08-06 DIAGNOSIS — M9906 Segmental and somatic dysfunction of lower extremity: Secondary | ICD-10-CM | POA: Diagnosis not present

## 2023-08-10 ENCOUNTER — Encounter: Payer: Self-pay | Admitting: Family

## 2023-08-10 ENCOUNTER — Ambulatory Visit: Payer: BC Managed Care – PPO | Admitting: Family

## 2023-08-10 VITALS — BP 100/68 | HR 64 | Temp 98.0°F | Ht 62.0 in | Wt 140.0 lb

## 2023-08-10 DIAGNOSIS — J069 Acute upper respiratory infection, unspecified: Secondary | ICD-10-CM

## 2023-08-10 DIAGNOSIS — R490 Dysphonia: Secondary | ICD-10-CM

## 2023-08-10 MED ORDER — METHYLPREDNISOLONE ACETATE 80 MG/ML IJ SUSP
80.0000 mg | Freq: Once | INTRAMUSCULAR | Status: AC
Start: 2023-08-10 — End: 2023-08-10
  Administered 2023-08-10: 80 mg via INTRAMUSCULAR

## 2023-08-10 MED ORDER — GUAIFENESIN-CODEINE 100-10 MG/5ML PO SOLN
5.0000 mL | Freq: Three times a day (TID) | ORAL | 0 refills | Status: DC | PRN
Start: 2023-08-10 — End: 2023-08-20

## 2023-08-10 NOTE — Progress Notes (Signed)
Patient ID: Allison Mcclure, female    DOB: August 25, 1976, 47 y.o.   MRN: [id number]  Chief Complaint  Patient presents with   Sore Throat    Pt c/o sore throat and cough, present for about a week. Has tried theraflu which did help sx. Covid negative last week,     Discussed the use of AI scribe software for clinical note transcription with the patient, who gave verbal consent to proceed.  History of Present Illness   The patient, with a history of recurrent strep throat and tonsillectomy, presents with a persistent cough and sore throat for over a week. She initially had a fever and body aches, which resolved quickly, but the sore throat and cough have persisted. The cough is both dry and productive, with morning expectoration of creamy mucus. She also has postnasal drip. She denies dysphagia. She tested negative for COVID-19 at home. She has been self-treating with over-the-counter cold and flu medications, which initially seemed to help, but her symptoms have since worsened. She has not had a flu shot this season. She also reports voice changes, which she attributes to the persistent cough.     Assessment & Plan:     Upper Respiratory Infection - Persistent cough, sore throat, and fatigue for over a week. Negative home COVID test. No signs of strep throat or pneumonia on examination. Mucus production has changed from dark yellow to creamy. -Administer steroid shot to reduce inflammation and improve symptoms. -Prescribe cough syrup with codeine for cough control and to aid sleep. -Advised to continue with over-the-counter remedies such as Theraflu, antihistamines, cough drops, and hot tea with honey. -Encourage hydration and voice rest. -Advise to seek medical attention if symptoms worsen or fever reoccurs.    Subjective:    Outpatient Medications Prior to Visit  Medication Sig Dispense Refill   cetirizine (ZYRTEC) 5 MG tablet Take 5 mg by mouth daily.     Cholecalciferol (VITAMIN D)  125 MCG (5000 UT) CAPS Take 1 capsule by mouth daily in the afternoon.     Menaquinone-7 (VITAMIN K2 PO) Take 1 capsule by mouth daily.     metoprolol succinate (TOPROL-XL) 25 MG 24 hr tablet Take 1 tablet (25 mg total) by mouth 2 (two) times daily. (Patient taking differently: Take 25 mg by mouth daily.) 180 tablet 3   omeprazole (PRILOSEC) 20 MG capsule TAKE 1 CAPSULE BY MOUTH EVERY DAY 30 capsule 2   traZODone (DESYREL) 50 MG tablet Take 0.5-1 tablets (25-50 mg total) by mouth at bedtime as needed for sleep. 30 tablet 3   triamcinolone (NASACORT ALLERGY 24HR) 55 MCG/ACT AERO nasal inhaler Place 1 spray into the nose as needed.     No facility-administered medications prior to visit.   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 +, Her2 neu -; neg lymph nodes; s/p lumpectomy, radiation, attempted tamoxifen with side effects   Cervical dysplasia    Gastrointestinal disorder    GERD (gastroesophageal reflux disease)    Heart palpitations    IBS (irritable bowel syndrome)    Ovarian cyst    Past Surgical History:  Procedure Laterality Date   arm surgery     left   BREAST LUMPECTOMY WITH RADIOACTIVE SEED AND SENTINEL LYMPH NODE BIOPSY Right 03/01/2016   Procedure: RIGHT BREAST LUMPECTOMY WITH RADIOACTIVE SEED AND SENTINEL LYMPH NODE BIOPSY;  Surgeon: Almond Lint, MD;  Location: [facility];  Service: General;  Laterality: Right;   COLONOSCOPY     CRYOTHERAPY     EXCISION MASS LOWER EXTREMETIES Right 03/01/2016   Procedure: EXCISION MASS RIGHT THIGH;  Surgeon: Almond Lint, MD;  Location: [facility];  Service: General;  Laterality: Right;   MOUTH SURGERY     "gum surgery" x 2   TONSILLECTOMY     WISDOM TOOTH EXTRACTION     Allergies  Allergen Reactions   Sunscreen Spf30 [Solbar Pf Spf15] Hives, Itching and Rash   Cephalexin Rash and Other (See Comments)       Objective:    Physical Exam Vitals and nursing note reviewed.  Constitutional:      Appearance: Normal appearance. She is ill-appearing.     Interventions: Face mask in place.  HENT:     Right Ear: Tympanic membrane and ear canal normal.     Left Ear: Tympanic membrane and ear canal normal.     Nose:     Right Sinus: Frontal sinus tenderness present.     Left Sinus: Frontal sinus tenderness present.     Mouth/Throat:     Mouth: Mucous membranes are moist.     Pharynx: Posterior oropharyngeal erythema present. No pharyngeal swelling, oropharyngeal exudate or uvula swelling.     Tonsils: No tonsillar exudate or tonsillar abscesses.  Cardiovascular:     Rate and Rhythm: Normal rate and regular rhythm.  Pulmonary:     Effort: Pulmonary effort is normal.     Breath sounds: Normal breath sounds.  Musculoskeletal:        General: Normal range of motion.  Lymphadenopathy:     Head:     Right side of head: No preauricular or posterior auricular adenopathy.     Left side of head: No preauricular or posterior auricular adenopathy.     Cervical: No cervical adenopathy.  Skin:    General: Skin is warm and dry.  Neurological:     Mental Status: She is alert.  Psychiatric:        Mood and Affect: Mood normal.        Behavior: Behavior normal.    BP 100/68 (BP Location: Left Arm, Patient Position: Sitting, Cuff Size: Normal)   Pulse 64   Temp 98 F (36.7 C) (Temporal)   Ht 5\' 2"  (1.575 m)   Wt 140 lb (63.5 kg)   LMP 07/05/2023 (Exact Date)   SpO2 98%   BMI 25.61 kg/m  Wt Readings from Last 3 Encounters:  08/10/23 140 lb (63.5 kg)  07/18/23 141 lb (64 kg)  06/27/23 139 lb 4 oz (63.2 kg)       Dulce Sellar, NP

## 2023-08-20 ENCOUNTER — Ambulatory Visit (INDEPENDENT_AMBULATORY_CARE_PROVIDER_SITE_OTHER): Payer: BC Managed Care – PPO | Admitting: Physician Assistant

## 2023-08-20 ENCOUNTER — Encounter: Payer: Self-pay | Admitting: Physician Assistant

## 2023-08-20 VITALS — BP 100/60 | HR 60 | Temp 97.5°F | Ht 62.0 in | Wt 140.0 lb

## 2023-08-20 DIAGNOSIS — E785 Hyperlipidemia, unspecified: Secondary | ICD-10-CM | POA: Diagnosis not present

## 2023-08-20 DIAGNOSIS — R002 Palpitations: Secondary | ICD-10-CM | POA: Diagnosis not present

## 2023-08-20 DIAGNOSIS — G4700 Insomnia, unspecified: Secondary | ICD-10-CM | POA: Diagnosis not present

## 2023-08-20 DIAGNOSIS — M9901 Segmental and somatic dysfunction of cervical region: Secondary | ICD-10-CM | POA: Diagnosis not present

## 2023-08-20 DIAGNOSIS — Z0000 Encounter for general adult medical examination without abnormal findings: Secondary | ICD-10-CM

## 2023-08-20 DIAGNOSIS — S46011A Strain of muscle(s) and tendon(s) of the rotator cuff of right shoulder, initial encounter: Secondary | ICD-10-CM | POA: Diagnosis not present

## 2023-08-20 DIAGNOSIS — M9906 Segmental and somatic dysfunction of lower extremity: Secondary | ICD-10-CM | POA: Diagnosis not present

## 2023-08-20 DIAGNOSIS — M9903 Segmental and somatic dysfunction of lumbar region: Secondary | ICD-10-CM | POA: Diagnosis not present

## 2023-08-20 NOTE — Progress Notes (Signed)
Allison Mcclure is a 47 y.o. female and is here for a comprehensive physical exam. HPI  Acute Concerns: None.  Chronic Issues: Palpitations: Managed with 25 mg Toprol-XL daily.   Insomnia: Managed with 25-50 mg Trazodone PRN.  Reports that sometimes, even with a full 50 mg, she struggles to fall asleep.  She admits that she does not take this at a consistent time daily if she does take it and can sometimes take it as last as 10p Discussed switching to taking Trazodone daily. Denies SI/HI  Health Maintenance: Immunizations -- utd Colonoscopy -- Last done 10/10/21. Results were normal. Repeat 2033. Mammogram -- Last done 03/02/23. Results were normal. Repeat 2025. PAP -- Last done 01/30/23. Results were normal. Repeat 2029. Bone Density -- N/A Diet -- Healthy overall.  Exercise -- Decreased activity recently due to illness.   Sleep habits -- Insomnia, otherwise fair sleep quality.  Mood -- Stable.  UTD with dentist? - Yes UTD with eye doctor? - Yes  Weight history: Wt Readings from Last 10 Encounters:  08/20/23 140 lb (63.5 kg)  08/10/23 140 lb (63.5 kg)  07/18/23 141 lb (64 kg)  06/27/23 139 lb 4 oz (63.2 kg)  04/13/23 141 lb 6 oz (64.1 kg)  01/30/23 140 lb (63.5 kg)  01/12/23 138 lb (62.6 kg)  11/13/22 142 lb (64.4 kg)  03/03/22 138 lb 6.4 oz (62.8 kg)  02/28/22 139 lb 9.6 oz (63.3 kg)   Body mass index is 25.61 kg/m. Patient's last menstrual period was 08/18/2023 (exact date).  Alcohol use:  reports current alcohol use of about 2.0 - 7.0 standard drinks of alcohol per week.  Tobacco use:  Tobacco Use: Low Risk  (08/20/2023)   Patient History    Smoking Tobacco Use: Never    Smokeless Tobacco Use: Never    Passive Exposure: Not on file   Eligible for lung cancer screening? no     08/20/2023    9:56 AM  Depression screen PHQ 2/9  Decreased Interest 0  Down, Depressed, Hopeless 0  PHQ - 2 Score 0  Altered sleeping 1  Tired, decreased energy 0  Change  in appetite 0  Feeling bad or failure about yourself  0  Trouble concentrating 1  Moving slowly or fidgety/restless 1  Suicidal thoughts 0  PHQ-9 Score 3  Difficult doing work/chores Not difficult at all    Other providers/specialists: Patient Care Team: Jarold Motto, [place] as PCP - General (Physician Assistant)   PMHx, SurgHx, SocialHx, Medications, and Allergies were reviewed in the Visit Navigator and updated as appropriate.   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 +, Her2 neu -; neg lymph nodes; s/p lumpectomy, radiation, attempted tamoxifen with side effects   Cervical dysplasia    Gastrointestinal disorder    GERD (gastroesophageal reflux disease)    Heart palpitations    IBS (irritable bowel syndrome)    Ovarian cyst     Past Surgical History:  Procedure Laterality Date   arm surgery     left   BREAST LUMPECTOMY WITH RADIOACTIVE SEED AND SENTINEL LYMPH NODE BIOPSY Right 03/01/2016   Procedure: RIGHT BREAST LUMPECTOMY WITH RADIOACTIVE SEED AND SENTINEL LYMPH NODE BIOPSY;  Surgeon: Almond Lint, MD;  Location: [facility];  Service: General;  Laterality: Right;   COLONOSCOPY     CRYOTHERAPY  EXCISION MASS LOWER EXTREMETIES Right 03/01/2016   Procedure: EXCISION MASS RIGHT THIGH;  Surgeon: Almond Lint, MD;  Location: [facility];  Service: General;  Laterality: Right;   MOUTH SURGERY     "gum surgery" x 2   TONSILLECTOMY     WISDOM TOOTH EXTRACTION     Family History  Problem Relation Age of Onset   Hypertension Mother    Hyperlipidemia Mother    Hyperlipidemia Father    Prostate cancer Father    Hypertension Father    Heart attack Father        likely 45's   Other Sister        breast mastectomy did not have cancer but needed removal as she has had something that may have caused cancer   Hearing loss Brother    Heart failure  Maternal Grandmother    Heart failure Maternal Grandfather    Bone cancer Paternal Grandfather    Prostate cancer Paternal Uncle    Ovarian cancer Cousin 33   Colon cancer Other        PGGF   Esophageal cancer Neg Hx    Rectal cancer Neg Hx    Stomach cancer Neg Hx    Social History   Tobacco Use   Smoking status: Never   Smokeless tobacco: Never  Vaping Use   Vaping status: Never Used  Substance Use Topics   Alcohol use: Yes    Alcohol/week: 2.0 - 7.0 standard drinks of alcohol    Types: 1 - 2 Glasses of wine, 1 - 5 Shots of liquor per week   Drug use: Never   Review of Systems:   Review of Systems  Constitutional:  Negative for chills, fever, malaise/fatigue and weight loss.  HENT:  Negative for hearing loss, sinus pain and sore throat.   Respiratory:  Negative for cough and hemoptysis.   Cardiovascular:  Negative for chest pain, palpitations, leg swelling and PND.  Gastrointestinal:  Negative for abdominal pain, constipation, diarrhea, heartburn, nausea and vomiting.  Genitourinary:  Negative for dysuria, frequency and urgency.  Musculoskeletal:  Negative for back pain, myalgias and neck pain.  Skin:  Negative for itching and rash.  Neurological:  Negative for dizziness, tingling, seizures and headaches.  Endo/Heme/Allergies:  Negative for polydipsia.  Psychiatric/Behavioral:  Negative for depression. The patient is not nervous/anxious.      Objective:   BP 100/60 (BP Location: Left Arm, Patient Position: Sitting, Cuff Size: Normal)   Pulse 60   Temp (!) 97.5 F (36.4 C) (Temporal)   Ht 5\' 2"  (1.575 m)   Wt 140 lb (63.5 kg)   LMP 08/18/2023 (Exact Date)   SpO2 98%   BMI 25.61 kg/m  Body mass index is 25.61 kg/m.   General Appearance:    Alert, cooperative, no distress, appears stated age  Head:    Normocephalic, without obvious abnormality, atraumatic  Eyes:    PERRL, conjunctiva/corneas clear, EOM's intact, fundi    benign, both eyes  Ears:    Normal  TM's and external ear canals, both ears  Nose:   Nares normal, septum midline, mucosa normal, no drainage    or sinus tenderness  Throat:   Lips, mucosa, and tongue normal; teeth and gums normal  Neck:   Supple, symmetrical, trachea midline, no adenopathy;    thyroid:  no enlargement/tenderness/nodules; no carotid   bruit or JVD  Back:     Symmetric, no curvature, ROM normal, no CVA tenderness  Lungs:  Clear to auscultation bilaterally, respirations unlabored  Chest Wall:    No tenderness or deformity   Heart:    Regular rate and rhythm, S1 and S2 normal, no murmur, rub or gallop  Breast Exam:    Deferred  Abdomen:     Soft, non-tender, bowel sounds active all four quadrants,    no masses, no organomegaly  Genitalia:    Deferred  Extremities:   Extremities normal, atraumatic, no cyanosis or edema  Pulses:   2+ and symmetric all extremities  Skin:   Skin color, texture, turgor normal, no rashes or lesions  Lymph nodes:   Cervical, supraclavicular, and axillary nodes normal  Neurologic:   CNII-XII intact, normal strength, sensation and reflexes    throughout    Assessment/Plan:   Routine physical examination Today patient counseled on age appropriate routine health concerns for screening and prevention, each reviewed and up to date or declined. Immunizations reviewed and up to date or declined. Labs ordered and reviewed. Risk factors for depression reviewed and negative. Hearing function and visual acuity are intact. ADLs screened and addressed as needed. Functional ability and level of safety reviewed and appropriate. Education, counseling and referrals performed based on assessed risks today. Patient provided with a copy of personalized plan for preventive services.  Palpitations Well controlled per patient Continue metoprolol succinate 25 mg twice daily Follow-up with cardiology as recommended  Insomnia, unspecified type Continue efforts at adequate sleep hygiene Continue  trazodone 50 mg daily -- recommend taking this daily and at least 30 minutes before getting into bed Follow-up In 1 year, sooner if concerns  Hyperlipidemia, unspecified hyperlipidemia type Blood work has improved however, we did discuss possibility of getting calcium score -- she will look into this and let us know if this is something that she is interested in  [employer] as a scribe for [employer], PA.,have documented all relevant documentation on the behalf of Jarold Motto, PA,as directed by  Jarold Motto, PA while in the presence of Jarold Motto, [place].  I, Jarold Motto, [place], have reviewed all documentation for this visit. The documentation on 08/20/23 for the exam, diagnosis, procedures, and orders are all accurate and complete.  Jarold Motto, PA-C [facility]

## 2023-08-20 NOTE — Patient Instructions (Addendum)
It was great to see you!  Consider calcium scan -- if you want this, please let me know (see attached handout)  Consider daily trazodone to provide more consistent sleep  Consider scheduling with surgeon to follow-up on abdominal pain  Take care,  Lelon Mast

## 2023-08-22 ENCOUNTER — Telehealth: Payer: Self-pay

## 2023-08-22 NOTE — Telephone Encounter (Signed)
TR[facility] VOICEMAIL: Patient inquiring if she can get a rx. She knows she has an infection.

## 2023-08-22 NOTE — Telephone Encounter (Signed)
Spoke with patient. She noticed yesterday she has a fishy odor. She has had BV before. Chart reviewed, no labs seen for +BV. Advised patient will need evaluation. She states she is in [place] without a car. She will contact PCP and call us back for message to be reviewed by Helmut Muster Copland if PCP unable to send rx.

## 2023-08-23 ENCOUNTER — Encounter: Payer: Self-pay | Admitting: Physician Assistant

## 2023-08-23 ENCOUNTER — Ambulatory Visit: Payer: BC Managed Care – PPO | Admitting: Physician Assistant

## 2023-08-23 ENCOUNTER — Other Ambulatory Visit (HOSPITAL_COMMUNITY)
Admission: RE | Admit: 2023-08-23 | Discharge: 2023-08-23 | Disposition: A | Discharge status: Home / Self Care | Payer: BC Managed Care – PPO | Source: Ambulatory Visit | Attending: Physician Assistant | Admitting: Physician Assistant

## 2023-08-23 VITALS — BP 110/70 | HR 74 | Ht 62.0 in | Wt 140.2 lb

## 2023-08-23 DIAGNOSIS — G4700 Insomnia, unspecified: Secondary | ICD-10-CM | POA: Diagnosis not present

## 2023-08-23 DIAGNOSIS — R051 Acute cough: Secondary | ICD-10-CM

## 2023-08-23 DIAGNOSIS — N898 Other specified noninflammatory disorders of vagina: Secondary | ICD-10-CM | POA: Insufficient documentation

## 2023-08-23 NOTE — Progress Notes (Signed)
Allison Mcclure is a 47 y.o. female here for a new problem.  History of Present Illness:   Chief Complaint  Patient presents with   vaginal odor    HPI  Vaginal Malodor: Complains of vaginal malodor. Endorses mild discomfort, and some discharge but notes that she was on her menstrual period.  Reports that she has had BV before, with the last time being about 10-15 years. Denies urinary symptoms and fever/chills  Cough: Complains of dry cough starting 2 days ago.  Endorses sneezing and mild post-nasal drip. Reports that her cough has been worse at night.  States that she forgot to take her 5 mg Zyrtec 2 days ago, did take it yesterday, and also picked up OTC Nasal Allergy Spray Notes that she is not taking her Omeprazole but feels that her post-nasal drip is the contributor to her cough. Denies fever/chills.  Insomnia: Managed with 50 mg Trazodone. Reports that it's working extremely well, but notes that she experiencing mild drowsiness in the morning.   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 +, Her2 neu -; neg lymph nodes; s/p lumpectomy, radiation, attempted tamoxifen with side effects   Cervical dysplasia    Gastrointestinal disorder    GERD (gastroesophageal reflux disease)    Heart palpitations    IBS (irritable bowel syndrome)    Ovarian cyst     Social History   Tobacco Use   Smoking status: Never   Smokeless tobacco: Never  Vaping Use   Vaping status: Never Used  Substance Use Topics   Alcohol use: Yes    Alcohol/week: 2.0 - 7.0 standard drinks of alcohol    Types: 1 - 2 Glasses of wine, 1 - 5 Shots of liquor per week   Drug use: Never   Past Surgical History:  Procedure Laterality Date   arm surgery     left   BREAST LUMPECTOMY WITH RADIOACTIVE SEED AND SENTINEL LYMPH NODE BIOPSY Right 03/01/2016   Procedure: RIGHT BREAST LUMPECTOMY WITH RADIOACTIVE  SEED AND SENTINEL LYMPH NODE BIOPSY;  Surgeon: Almond Lint, MD;  Location: [facility];  Service: General;  Laterality: Right;   COLONOSCOPY     CRYOTHERAPY     EXCISION MASS LOWER EXTREMETIES Right 03/01/2016   Procedure: EXCISION MASS RIGHT THIGH;  Surgeon: Almond Lint, MD;  Location: [facility];  Service: General;  Laterality: Right;   MOUTH SURGERY     "gum surgery" x 2   TONSILLECTOMY     WISDOM TOOTH EXTRACTION     Family History  Problem Relation Age of Onset   Hypertension Mother    Hyperlipidemia Mother    Hyperlipidemia Father    Prostate cancer Father    Hypertension Father    Heart attack Father        likely 18's   Other Sister        breast mastectomy did not have cancer but needed removal as she has had something that may have caused cancer   Hearing loss Brother    Heart failure Maternal Grandmother    Heart failure Maternal Grandfather    Bone cancer Paternal Grandfather    Prostate cancer Paternal Uncle    Ovarian cancer Cousin 33   Colon cancer Other        PGGF   Esophageal cancer Neg Hx  Rectal cancer Neg Hx    Stomach cancer Neg Hx    Allergies  Allergen Reactions   Sunscreen Spf30 [Solbar Pf Spf15] Hives, Itching and Rash   Cephalexin Rash and Other (See Comments)   Current Medications:   Current Outpatient Medications:    cetirizine (ZYRTEC) 5 MG tablet, Take 5 mg by mouth daily., Disp: , Rfl:    Cholecalciferol (VITAMIN D) 125 MCG (5000 UT) CAPS, Take 1 capsule by mouth daily in the afternoon., Disp: , Rfl:    metoprolol succinate (TOPROL-XL) 25 MG 24 hr tablet, Take 1 tablet (25 mg total) by mouth 2 (two) times daily. (Patient taking differently: Take 25 mg by mouth daily.), Disp: 180 tablet, Rfl: 3   omeprazole (PRILOSEC) 20 MG capsule, TAKE 1 CAPSULE BY MOUTH EVERY DAY, Disp: 30 capsule, Rfl: 2   traZODone (DESYREL) 50 MG tablet, Take 0.5-1 tablets (25-50 mg total) by mouth at bedtime as needed for sleep., Disp:  30 tablet, Rfl: 3  Review of Systems:   ROS See pertinent positives and negatives as per the HPI.  Vitals:   Vitals:   08/23/23 0806  BP: 110/70  Pulse: 74  SpO2: 97%  Weight: 140 lb 3.2 oz (63.6 kg)  Height: 5\' 2"  (1.575 m)     Body mass index is 25.64 kg/m.  Physical Exam:   Physical Exam Vitals and nursing note reviewed.  Constitutional:      General: She is not in acute distress.    Appearance: She is well-developed. She is not ill-appearing or toxic-appearing.  Cardiovascular:     Rate and Rhythm: Normal rate and regular rhythm.     Pulses: Normal pulses.     Heart sounds: Normal heart sounds, S1 normal and S2 normal.  Pulmonary:     Effort: Pulmonary effort is normal.     Breath sounds: Normal breath sounds.  Skin:    General: Skin is warm and dry.  Neurological:     Mental Status: She is alert.     GCS: GCS eye subscore is 4. GCS verbal subscore is 5. GCS motor subscore is 6.  Psychiatric:        Speech: Speech normal.        Behavior: Behavior normal. Behavior is cooperative.    Patient obtained self swab  Assessment and Plan:   Vaginal odor Patient obtained self-swab Continue to monitor symptom(s)  Will add treatment based on results  Acute cough No red flags on exam.   Suspect viral upper respiratory infection (URI) and post nasal drip  Recommend more consistent use of nasal spray, allergy pill and proton pump inhibitor (PPI) stomach acid reducer  Discussed taking medications as prescribed.  Reviewed return precautions including new or worsening fever, SOB, new or worsening cough or other concerns.  Push fluids and rest.  I recommend that patient follow-up if symptoms worsen or persist despite treatment x 7-10 days, sooner if needed.  Insomnia, unspecified type Improved Continue trazodone 50 mg nightly Follow-up in 1 year, sooner if concerns  I,Emily Lagle,acting as a scribe for [employer], PA.,have documented all relevant documentation  on the behalf of Jarold Motto, PA,as directed by  Jarold Motto, PA while in the presence of Jarold Motto, [place].  I, Jarold Motto, [place], have reviewed all documentation for this visit. The documentation on 08/23/23 for the exam, diagnosis, procedures, and orders are all accurate and complete.  Jarold Motto, PA-C

## 2023-08-24 LAB — CERVICOVAGINAL ANCILLARY ONLY
Bacterial Vaginitis (gardnerella): NEGATIVE
Candida Glabrata: NEGATIVE
Candida Vaginitis: NEGATIVE
Chlamydia: NEGATIVE
Comment: NEGATIVE
Comment: NEGATIVE
Comment: NEGATIVE
Comment: NEGATIVE
Comment: NEGATIVE
Comment: NORMAL
Neisseria Gonorrhea: NEGATIVE
Trichomonas: NEGATIVE

## 2023-08-28 ENCOUNTER — Encounter: Payer: Self-pay | Admitting: Physician Assistant

## 2023-09-03 DIAGNOSIS — M9901 Segmental and somatic dysfunction of cervical region: Secondary | ICD-10-CM | POA: Diagnosis not present

## 2023-09-03 DIAGNOSIS — S46011A Strain of muscle(s) and tendon(s) of the rotator cuff of right shoulder, initial encounter: Secondary | ICD-10-CM | POA: Diagnosis not present

## 2023-09-03 DIAGNOSIS — M9903 Segmental and somatic dysfunction of lumbar region: Secondary | ICD-10-CM | POA: Diagnosis not present

## 2023-09-03 DIAGNOSIS — M9906 Segmental and somatic dysfunction of lower extremity: Secondary | ICD-10-CM | POA: Diagnosis not present

## 2023-09-14 ENCOUNTER — Telehealth: Payer: Self-pay | Admitting: *Deleted

## 2023-09-14 ENCOUNTER — Encounter: Payer: Self-pay | Admitting: Physician Assistant

## 2023-09-14 ENCOUNTER — Ambulatory Visit: Payer: BC Managed Care – PPO | Admitting: Physician Assistant

## 2023-09-14 VITALS — BP 107/70 | HR 75 | Temp 98.2°F | Ht 62.0 in | Wt 140.2 lb

## 2023-09-14 DIAGNOSIS — J069 Acute upper respiratory infection, unspecified: Secondary | ICD-10-CM | POA: Diagnosis not present

## 2023-09-14 LAB — POC COVID19 BINAXNOW: SARS Coronavirus 2 Ag: NEGATIVE

## 2023-09-14 LAB — POCT INFLUENZA A/B
Influenza A, POC: NEGATIVE
Influenza B, POC: NEGATIVE

## 2023-09-14 MED ORDER — BENZONATATE 100 MG PO CAPS: 100.0000 mg | ORAL_CAPSULE | Freq: Three times a day (TID) | ORAL | 0 refills | Status: DC | PRN

## 2023-09-14 NOTE — Patient Instructions (Signed)
You have a viral upper respiratory infection. This type of infection does not require antibiotics. Symptoms should improve over the next 7 to 10 days.   Some things that can make you feel better are: - Increased rest - Increasing fluid with water/sugar free electrolytes - Acetaminophen and ibuprofen as needed for fever/pain - Salt water gargling, chloraseptic spray and throat lozenges for sore throat - OTC guaifenesin (Mucinex) 600 mg twice daily for congestion - Saline sinus flushes or a neti pot - Humidifying the air -Umcka OTC

## 2023-09-14 NOTE — Telephone Encounter (Signed)
Left message to return call to our office at their convenience to schedule a VV or in person with PCP

## 2023-09-14 NOTE — Progress Notes (Signed)
Patient ID: Allison Mcclure, female    DOB: Jun 10, 1976, 47 y.o.   MRN: [id number]   Assessment & Plan:  Acute URI -     POC COVID-19 BinaxNow -     POCT Influenza A/B  Other orders -     Benzonatate; Take 1 capsule (100 mg total) by mouth 3 (three) times daily as needed for cough.  Dispense: 21 capsule; Refill: 0    Assessment and Plan    Upper Respiratory Infection Symptoms started on 09/11/2023 with cough, congestion, and body aches. Negative for flu and COVID-19. Currently taking Mucinex DM and Advil Cold and Sinus. -Continue Mucinex DM and Advil Cold and Sinus as needed. -Prescribe Tessalon Perles for cough. -Consider trying Umcka (natural homeopathic remedy) for symptom relief. -Rotate Tylenol and ibuprofen every eight hours for body aches. -Encourage rest, hydration, and hot tea.          No follow-ups on file.    Subjective:    Chief Complaint  Patient presents with   Cough   Nasal Congestion    Productive    Generalized Body Aches    Symptoms on 09/10/2024    Cough   Discussed the use of AI scribe software for clinical note transcription with the patient, who gave verbal consent to proceed.  History of Present Illness   The patient presents with a 3-day history of coughing, chest congestion, and body aches. The symptoms started on Christmas Eve and have persisted since then. The patient describes the body aches as constant and the most concerning symptom. Over-the-counter cold medicine, including Mucinex DM and Advil Cold and Sinus, have provided some relief. The patient's fianc has also started showing similar symptoms. The patient has tested negative for flu and COVID-19.       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 +, Her2 neu -; neg lymph nodes; s/p lumpectomy, radiation, attempted tamoxifen with side effects   Cervical dysplasia     Gastrointestinal disorder    GERD (gastroesophageal reflux disease)    Heart palpitations    IBS (irritable bowel syndrome)    Ovarian cyst     Past Surgical History:  Procedure Laterality Date   arm surgery     left   BREAST LUMPECTOMY WITH RADIOACTIVE SEED AND SENTINEL LYMPH NODE BIOPSY Right 03/01/2016   Procedure: RIGHT BREAST LUMPECTOMY WITH RADIOACTIVE SEED AND SENTINEL LYMPH NODE BIOPSY;  Surgeon: Almond Lint, MD;  Location: [facility];  Service: General;  Laterality: Right;   COLONOSCOPY     CRYOTHERAPY     EXCISION MASS LOWER EXTREMETIES Right 03/01/2016   Procedure: EXCISION MASS RIGHT THIGH;  Surgeon: Almond Lint, MD;  Location: [facility];  Service: General;  Laterality: Right;   MOUTH SURGERY     "gum surgery" x 2   TONSILLECTOMY     WISDOM TOOTH EXTRACTION      Family History  Problem Relation Age of Onset   Hypertension Mother    Hyperlipidemia Mother    Hyperlipidemia Father    Prostate cancer Father    Hypertension Father    Heart attack Father        likely 51's   Other Sister        breast mastectomy did not have cancer but needed removal as she has had something that may have  caused cancer   Hearing loss Brother    Heart failure Maternal Grandmother    Heart failure Maternal Grandfather    Bone cancer Paternal Grandfather    Prostate cancer Paternal Uncle    Ovarian cancer Cousin 33   Colon cancer Other        PGGF   Esophageal cancer Neg Hx    Rectal cancer Neg Hx    Stomach cancer Neg Hx     Social History   Tobacco Use   Smoking status: Never   Smokeless tobacco: Never  Vaping Use   Vaping status: Never Used  Substance Use Topics   Alcohol use: Yes    Alcohol/week: 2.0 - 7.0 standard drinks of alcohol    Types: 1 - 2 Glasses of wine, 1 - 5 Shots of liquor per week   Drug use: Never     Allergies  Allergen Reactions   Sunscreen Spf30 [Solbar Pf Spf15] Hives, Itching and Rash   Cephalexin Rash and  Other (See Comments)    Review of Systems  Respiratory:  Positive for cough.    NEGATIVE UNLESS OTHERWISE INDICATED IN HPI      Objective:     BP 107/70   Pulse 75   Temp 98.2 F (36.8 C) (Temporal)   Ht 5\' 2"  (1.575 m)   Wt 140 lb 3.2 oz (63.6 kg)   LMP 08/18/2023 (Exact Date)   SpO2 96%   BMI 25.64 kg/m   Wt Readings from Last 3 Encounters:  09/14/23 140 lb 3.2 oz (63.6 kg)  08/23/23 140 lb 3.2 oz (63.6 kg)  08/20/23 140 lb (63.5 kg)    BP Readings from Last 3 Encounters:  09/14/23 107/70  08/23/23 110/70  08/20/23 100/60     Physical Exam Vitals and nursing note reviewed.  Constitutional:      General: She is not in acute distress.    Appearance: Normal appearance. She is not ill-appearing.  HENT:     Head: Normocephalic.     Right Ear: Tympanic membrane, ear canal and external ear normal.     Left Ear: Tympanic membrane, ear canal and external ear normal.     Nose: No congestion.     Mouth/Throat:     Mouth: Mucous membranes are moist.     Pharynx: No oropharyngeal exudate or posterior oropharyngeal erythema.     Comments: PND noted with mild post oropharynx erythema  Eyes:     Extraocular Movements: Extraocular movements intact.     Conjunctiva/sclera: Conjunctivae normal.     Pupils: Pupils are equal, round, and reactive to light.  Cardiovascular:     Rate and Rhythm: Normal rate and regular rhythm.     Pulses: Normal pulses.     Heart sounds: Normal heart sounds. No murmur heard. Pulmonary:     Effort: Pulmonary effort is normal. No respiratory distress.     Breath sounds: Normal breath sounds. No wheezing.  Musculoskeletal:     Cervical back: Normal range of motion.  Skin:    General: Skin is warm.  Neurological:     Mental Status: She is alert and oriented to person, place, and time.  Psychiatric:        Mood and Affect: Mood normal.        Behavior: Behavior normal.           Jaideep Pollack M Jake Fuhrmann, PA-C

## 2023-09-14 NOTE — Telephone Encounter (Signed)
  Pt seeing by Ila Mcgill, PA. PCP out of the office till 12/31. Copied from CRM [id number]. Topic: Appointments - Appointment Scheduling >> Sep 14, 2023 10:06 AM Larwance Sachs wrote: Patient/patient representative is calling to schedule an appointment. Refer to attachments for appointment information.   Patient did get scheduled but would like a call from Ms.Bufford Buttner if possible

## 2023-09-17 ENCOUNTER — Ambulatory Visit: Payer: BC Managed Care – PPO | Admitting: Family Medicine

## 2023-09-17 DIAGNOSIS — M9901 Segmental and somatic dysfunction of cervical region: Secondary | ICD-10-CM | POA: Diagnosis not present

## 2023-09-17 DIAGNOSIS — S46011A Strain of muscle(s) and tendon(s) of the rotator cuff of right shoulder, initial encounter: Secondary | ICD-10-CM | POA: Diagnosis not present

## 2023-09-17 DIAGNOSIS — M9906 Segmental and somatic dysfunction of lower extremity: Secondary | ICD-10-CM | POA: Diagnosis not present

## 2023-09-17 DIAGNOSIS — M9903 Segmental and somatic dysfunction of lumbar region: Secondary | ICD-10-CM | POA: Diagnosis not present

## 2023-09-20 NOTE — Telephone Encounter (Signed)
 Pt seen 09/14/2023 by Ila Mcgill, PA.

## 2023-09-25 DIAGNOSIS — D2239 Melanocytic nevi of other parts of face: Secondary | ICD-10-CM | POA: Diagnosis not present

## 2023-09-25 DIAGNOSIS — L821 Other seborrheic keratosis: Secondary | ICD-10-CM | POA: Diagnosis not present

## 2023-09-25 DIAGNOSIS — L570 Actinic keratosis: Secondary | ICD-10-CM | POA: Diagnosis not present

## 2023-09-25 DIAGNOSIS — L578 Other skin changes due to chronic exposure to nonionizing radiation: Secondary | ICD-10-CM | POA: Diagnosis not present

## 2023-09-27 DIAGNOSIS — M9901 Segmental and somatic dysfunction of cervical region: Secondary | ICD-10-CM | POA: Diagnosis not present

## 2023-09-27 DIAGNOSIS — S46011A Strain of muscle(s) and tendon(s) of the rotator cuff of right shoulder, initial encounter: Secondary | ICD-10-CM | POA: Diagnosis not present

## 2023-09-27 DIAGNOSIS — M9906 Segmental and somatic dysfunction of lower extremity: Secondary | ICD-10-CM | POA: Diagnosis not present

## 2023-09-27 DIAGNOSIS — M9903 Segmental and somatic dysfunction of lumbar region: Secondary | ICD-10-CM | POA: Diagnosis not present

## 2023-10-11 DIAGNOSIS — M9906 Segmental and somatic dysfunction of lower extremity: Secondary | ICD-10-CM | POA: Diagnosis not present

## 2023-10-11 DIAGNOSIS — M9903 Segmental and somatic dysfunction of lumbar region: Secondary | ICD-10-CM | POA: Diagnosis not present

## 2023-10-11 DIAGNOSIS — S46011A Strain of muscle(s) and tendon(s) of the rotator cuff of right shoulder, initial encounter: Secondary | ICD-10-CM | POA: Diagnosis not present

## 2023-10-11 DIAGNOSIS — M9901 Segmental and somatic dysfunction of cervical region: Secondary | ICD-10-CM | POA: Diagnosis not present

## 2023-10-12 ENCOUNTER — Ambulatory Visit: Payer: BC Managed Care – PPO | Admitting: Physician Assistant

## 2023-10-16 MED ORDER — OMEPRAZOLE 20 MG PO CPDR: 20.0000 mg | DELAYED_RELEASE_CAPSULE | Freq: Every day | ORAL | 3 refills | Status: AC

## 2023-10-16 MED ORDER — RIFAXIMIN 550 MG PO TABS: 550.0000 mg | ORAL_TABLET | Freq: Three times a day (TID) | ORAL | 0 refills | Status: AC

## 2023-10-16 NOTE — Addendum Note (Signed)
Addended by: Marisa Sprinkles on: 10/16/2023 12:20 PM   Modules accepted: Orders

## 2023-10-16 NOTE — Telephone Encounter (Signed)
Yes, okay to give Xifaxan 550 3 times daily x 2 weeks.  If she does not get better she should call and let us know.  Thanks, JL L

## 2023-10-16 NOTE — Telephone Encounter (Signed)
RX sent to [facility] pharmacy on file. Pt notified via MyChart.

## 2023-10-25 DIAGNOSIS — S46011A Strain of muscle(s) and tendon(s) of the rotator cuff of right shoulder, initial encounter: Secondary | ICD-10-CM | POA: Diagnosis not present

## 2023-10-25 DIAGNOSIS — M9906 Segmental and somatic dysfunction of lower extremity: Secondary | ICD-10-CM | POA: Diagnosis not present

## 2023-10-25 DIAGNOSIS — M9903 Segmental and somatic dysfunction of lumbar region: Secondary | ICD-10-CM | POA: Diagnosis not present

## 2023-10-25 DIAGNOSIS — M9901 Segmental and somatic dysfunction of cervical region: Secondary | ICD-10-CM | POA: Diagnosis not present

## 2023-11-15 DIAGNOSIS — M9906 Segmental and somatic dysfunction of lower extremity: Secondary | ICD-10-CM | POA: Diagnosis not present

## 2023-11-15 DIAGNOSIS — S46011A Strain of muscle(s) and tendon(s) of the rotator cuff of right shoulder, initial encounter: Secondary | ICD-10-CM | POA: Diagnosis not present

## 2023-11-15 DIAGNOSIS — M9901 Segmental and somatic dysfunction of cervical region: Secondary | ICD-10-CM | POA: Diagnosis not present

## 2023-11-15 DIAGNOSIS — M9903 Segmental and somatic dysfunction of lumbar region: Secondary | ICD-10-CM | POA: Diagnosis not present

## 2024-08-25 ENCOUNTER — Encounter: Payer: BC Managed Care – PPO | Admitting: Physician Assistant

## 2024-08-28 ENCOUNTER — Ambulatory Visit: Payer: Self-pay | Admitting: *Deleted

## 2024-08-28 NOTE — Telephone Encounter (Signed)
 Noted, appt tomorrow

## 2024-08-28 NOTE — Telephone Encounter (Signed)
 FYI Only or Action Required?: FYI only for provider: appointment scheduled on 12/12.  Patient was last seen in primary care on 09/14/2023 by Allwardt, Mardy HERO, PA-C.  Called Nurse Triage reporting Abdominal Pain.  Symptoms began several months ago.  Interventions attempted: Nothing.  Symptoms are: gradually worsening.  Triage Disposition: See Physician Within 24 Hours  Patient/caregiver understands and will follow disposition?: Yes  Copied from CRM #[id number]. Topic: Clinical - Red Word Triage >> Aug 28, 2024  8:46 AM Allison Mcclure wrote: [facility] that prompted transfer to Nurse Triage: pain in her vaginal area, possible infection, cyst or maybe fibroid?  Mysterious discharge.. Reason for Disposition  [1] MODERATE pain (e.Mcclure., interferes with normal activities) AND [2] pain comes and goes (cramps) AND [3] present > 24 hours  (Exception: Pain with Vomiting or Diarrhea - see that Guideline.)  Answer Assessment - Initial Assessment Questions 1. LOCATION: Where does it hurt?      Lower abdominal pain/cramping, abnormal discharge, patient feel she may have swelling above vaginal opening 2. RADIATION: Does the pain shoot anywhere else? (e.Mcclure., chest, back)     Pain into vaginal area 3. ONSET: When did the pain begin? (e.Mcclure., minutes, hours or days ago)      6 months- but getting worse 4. SUDDEN: Gradual or sudden onset?     gradual 5. PATTERN Does the pain come and go, or is it constant?     Comes and goes- sometimes during sex, just walking around 6. SEVERITY: How bad is the pain?  (e.Mcclure., Scale 1-10; mild, moderate, or severe)     Mild/moderate- aching 7. RECURRENT SYMPTOM: Have you ever had this type of stomach pain before? If Yes, ask: When was the last time? and What happened that time?      no 8. CAUSE: What do you think is causing the stomach pain? (e.Mcclure., gallstones, recent abdominal surgery)     Not sure- but feels related to her female organs 9.  RELIEVING/AGGRAVATING FACTORS: What makes it better or worse? (e.Mcclure., antacids, bending or twisting motion, bowel movement)     Advil 10. OTHER SYMPTOMS: Do you have any other symptoms? (e.Mcclure., back pain, diarrhea, fever, urination pain, vomiting)       Back pain, discharge  Protocols used: Abdominal Pain - [facility]

## 2024-08-29 ENCOUNTER — Ambulatory Visit: Payer: Self-pay | Admitting: Physician Assistant

## 2024-08-29 ENCOUNTER — Encounter: Payer: Self-pay | Admitting: Physician Assistant

## 2024-08-29 ENCOUNTER — Other Ambulatory Visit (HOSPITAL_COMMUNITY)
Admission: RE | Admit: 2024-08-29 | Discharge: 2024-08-29 | Disposition: A | Payer: Self-pay | Source: Ambulatory Visit | Attending: Physician Assistant | Admitting: Physician Assistant

## 2024-08-29 VITALS — BP 120/86 | HR 76 | Temp 98.1°F | Ht 62.0 in | Wt 135.2 lb

## 2024-08-29 DIAGNOSIS — R102 Pelvic and perineal pain unspecified side: Secondary | ICD-10-CM

## 2024-08-29 DIAGNOSIS — N898 Other specified noninflammatory disorders of vagina: Secondary | ICD-10-CM

## 2024-08-29 LAB — CBC WITH DIFFERENTIAL/PLATELET
Absolute Lymphocytes: 1413 {cells}/uL (ref 850–3900)
Absolute Monocytes: 657 {cells}/uL (ref 200–950)
Basophils Absolute: 36 {cells}/uL (ref 0–200)
Basophils Relative: 0.4 %
Eosinophils Absolute: 63 {cells}/uL (ref 15–500)
Eosinophils Relative: 0.7 %
HCT: 41.1 % (ref 35.9–46.0)
Hemoglobin: 13.4 g/dL (ref 11.7–15.5)
MCH: 31.2 pg (ref 27.0–33.0)
MCHC: 32.6 g/dL (ref 31.6–35.4)
MCV: 95.8 fL (ref 81.4–101.7)
MPV: 10.9 fL (ref 7.5–12.5)
Monocytes Relative: 7.3 %
Neutro Abs: 6831 {cells}/uL (ref 1500–7800)
Neutrophils Relative %: 75.9 %
Platelets: 304 Thousand/uL (ref 140–400)
RBC: 4.29 Million/uL (ref 3.80–5.10)
RDW: 11.7 % (ref 11.0–15.0)
Total Lymphocyte: 15.7 %
WBC: 9 Thousand/uL (ref 3.8–10.8)

## 2024-08-29 NOTE — Patient Instructions (Addendum)
°  VISIT SUMMARY: Today, you were seen for pelvic pain and unusual discharge. We discussed your symptoms, including cramping pain, watery discharge, and swelling. We also talked about your irregular periods and sleep issues.  YOUR PLAN: VAGINAL DISCHARGE AND PELVIC PAIN: You have been experiencing watery discharge and pelvic pain, which may be related to gynecological issues. -We ordered a vaginal swab to check for infection. -We ordered a complete blood count (CBC) to assess for infection. -Please seek emergency care if your right lower quadrant pain gets worse or if you develop a fever. -We recommend a repeat pelvic ultrasound once you have insurance.  PERIMENOPAUSAL SYMPTOMS: Your symptoms, including irregular periods and sleep disturbances, are consistent with perimenopause. -We discussed that trazodone  may not be very effective for sleep issues related to perimenopause.    Patient's last menstrual period was 08/09/2024 (exact date).                   Contains text generated by [employer]bridge.                                 Contains text generated by [employer]bridge.

## 2024-08-29 NOTE — Progress Notes (Signed)
 History of Present Illness:   Chief Complaint  Patient presents with   Abdominal Pain    Pt c/o lower abdominal pain, did ovulate this morning.   Vaginal Discharge    Pt c/o watery vaginal discharge, no odor or itching. Pt states vaginal area is swollen pons pubis.    Discussed the use of AI scribe software for clinical note transcription with the patient, who gave verbal consent to proceed.  History of Present Illness   Allison Mcclure is a 48 year old female who presents with pelvic pain and unusual discharge.  She reports cramping pelvic pain that began earlier this week, similar to menstrual cramps but new for her during ovulation. The pain has persisted all week, is worse on the right side, and is somewhat improved today but still present. She notes she suspects she is currently ovulating based on her vaginal discharge.  She describes a long-standing watery vaginal discharge that can be copious, without odor, which has decreased over the past few days. This week she noticed swelling and tenderness above the vagina and below the clitoris, with pain to touch causing dull pain and nausea. She has increased urinary urgency without dysuria or foul-smelling urine.  She has had dyspareunia in the past and occasional spotting or discharge after intercourse or orgasm, though not at her most recent encounter. She reports heavy and irregular menses and an ultrasound in 2023 that showed a 4 mm endometrial mass. She denies known STDs but is concerned about a possible STD as a cause of current symptoms.  She has right lower quadrant abdominal pain and chronic back pain. She has regular bowel movements without constipation. She had an abnormal Pap smear in college with normal results since.  She reports difficulty sleeping this week despite trazodone , often awake until 5 AM. She has been unemployed since February and currently lacks insurance. She is engaged and lives with her partner.         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 +, Her2 neu -; neg lymph nodes; s/p lumpectomy, radiation, attempted tamoxifen  with side effects   Cervical dysplasia    Gastrointestinal disorder    GERD (gastroesophageal reflux disease)    Heart palpitations    IBS (irritable bowel syndrome)    Ovarian cyst      Social History[1]  Past Surgical History:  Procedure Laterality Date   arm surgery     left   BREAST LUMPECTOMY WITH RADIOACTIVE SEED AND SENTINEL LYMPH NODE BIOPSY Right 03/01/2016   Procedure: RIGHT BREAST LUMPECTOMY WITH RADIOACTIVE SEED AND SENTINEL LYMPH NODE BIOPSY;  Surgeon: Jina Nephew, MD;  Location: [facility];  Service: General;  Laterality: Right;   COLONOSCOPY     CRYOTHERAPY     EXCISION MASS LOWER EXTREMETIES Right 03/01/2016   Procedure: EXCISION MASS RIGHT THIGH;  Surgeon: Jina Nephew, MD;  Location:  SURGERY CENTER;  Service: General;  Laterality: Right;   MOUTH SURGERY     gum surgery x 2   TONSILLECTOMY     WISDOM TOOTH EXTRACTION      Family History  Problem Relation Age of Onset   Hypertension Mother    Hyperlipidemia Mother    Hyperlipidemia Father    Prostate cancer Father    Hypertension Father    Heart attack Father  likely 73's   Other Sister        breast mastectomy did not have cancer but needed removal as she has had something that may have caused cancer   Hearing loss Brother    Heart failure Maternal Grandmother    Heart failure Maternal Grandfather    Bone cancer Paternal Grandfather    Prostate cancer Paternal Uncle    Ovarian cancer Cousin 2   Colon cancer Other        PGGF   Esophageal cancer Neg Hx    Rectal cancer Neg Hx    Stomach cancer Neg Hx     Allergies[2]  Current Medications:  Current Medications[3]   Review of Systems:   Negative unless otherwise specified per  HPI.  Vitals:   Vitals:   08/29/24 1406  BP: 120/86  Pulse: 76  Temp: 98.1 F (36.7 C)  TempSrc: Temporal  SpO2: 96%  Weight: 135 lb 4 oz (61.3 kg)  Height: 5' 2 (1.575 m)     Body mass index is 24.74 kg/m.  Physical Exam:   Physical Exam Vitals and nursing note reviewed. Exam conducted with a chaperone present.  Constitutional:      General: She is not in acute distress.    Appearance: She is well-developed. She is not ill-appearing or toxic-appearing.  Cardiovascular:     Rate and Rhythm: Normal rate and regular rhythm.     Pulses: Normal pulses.     Heart sounds: Normal heart sounds, S1 normal and S2 normal.  Pulmonary:     Effort: Pulmonary effort is normal.     Breath sounds: Normal breath sounds.  Abdominal:     Tenderness: There is abdominal tenderness in the right lower quadrant, suprapubic area and left lower quadrant. There is no right CVA tenderness, left CVA tenderness, guarding or rebound.  Genitourinary:    Labia:        Right: No rash or tenderness.        Left: No rash or tenderness.      Vagina: Normal. No vaginal discharge or tenderness.     Cervix: Normal. No cervical motion tenderness.  Skin:    General: Skin is warm and dry.  Neurological:     Mental Status: She is alert.     GCS: GCS eye subscore is 4. GCS verbal subscore is 5. GCS motor subscore is 6.  Psychiatric:        Speech: Speech normal.        Behavior: Behavior normal. Behavior is cooperative.     Assessment and Plan:   Assessment and Plan    Vaginal discharge and pelvic pain Intermittent watery discharge with pelvic pain, likely gynecological. Differential includes ovulation-related pain, bacterial vaginosis, ovarian cyst, pregnancy, appendicitis.  - She is self pay and states that she will do home upt to save cost, declines test here - Ordered vaginal swab to rule out infection. - Ordered CBC to assess for infection. - Advised emergency care if right lower quadrant pain  intensifies or fever develops. - Discussed need for repeat pelvic ultrasound once insurance is obtained.    Lucie Buttner, PA-C     [1]  Social History Tobacco Use   Smoking status: Never   Smokeless tobacco: Never  Vaping Use   Vaping status: Never Used  Substance Use Topics   Alcohol use: Yes    Alcohol/week: 2.0 - 7.0 standard drinks of alcohol    Types: 1 - 2 Glasses of wine, 1 - 5  Shots of liquor per week   Drug use: Never  [2]  Allergies Allergen Reactions   Sunscreen Spf30 [Solbar Pf Spf15] Hives, Itching and Rash   Cephalexin  Rash and Other (See Comments)  [3]  Current Outpatient Medications:    cetirizine  (ZYRTEC ) 5 MG tablet, Take 5 mg by mouth daily., Disp: , Rfl:    Cholecalciferol (VITAMIN D) 125 MCG (5000 UT) CAPS, Take 1 capsule by mouth daily in the afternoon., Disp: , Rfl:    omeprazole  (PRILOSEC) 20 MG capsule, Take 1 capsule (20 mg total) by mouth daily. (Patient taking differently: Take 20 mg by mouth daily as needed.), Disp: 90 capsule, Rfl: 3   traZODone  (DESYREL ) 50 MG tablet, Take 0.5-1 tablets (25-50 mg total) by mouth at bedtime as needed for sleep., Disp: 30 tablet, Rfl: 3   triamcinolone  (NASACORT) 55 MCG/ACT AERO nasal inhaler, Place 2 sprays into the nose daily., Disp: , Rfl:

## 2024-09-01 ENCOUNTER — Ambulatory Visit: Payer: Self-pay | Admitting: Physician Assistant

## 2024-09-01 LAB — CERVICOVAGINAL ANCILLARY ONLY
Bacterial Vaginitis (gardnerella): NEGATIVE
Candida Glabrata: NEGATIVE
Candida Vaginitis: NEGATIVE
Chlamydia: NEGATIVE
Comment: NEGATIVE
Comment: NEGATIVE
Comment: NEGATIVE
Comment: NEGATIVE
Comment: NEGATIVE
Comment: NORMAL
Neisseria Gonorrhea: NEGATIVE
Trichomonas: NEGATIVE
